# Patient Record
Sex: Female | Born: 1940 | Race: White | Hispanic: No | Marital: Married | Smoking: Never smoker
Health system: Southern US, Community
[De-identification: ages and names within clinical notes are randomized; demographics above are authoritative.]

## PROBLEM LIST (undated history)

## (undated) DIAGNOSIS — E785 Hyperlipidemia, unspecified: Secondary | ICD-10-CM

## (undated) DIAGNOSIS — I10 Essential (primary) hypertension: Secondary | ICD-10-CM

## (undated) DIAGNOSIS — C50919 Malignant neoplasm of unspecified site of unspecified female breast: Secondary | ICD-10-CM

## (undated) DIAGNOSIS — E119 Type 2 diabetes mellitus without complications: Secondary | ICD-10-CM

## (undated) DIAGNOSIS — L0390 Cellulitis, unspecified: Secondary | ICD-10-CM

## (undated) DIAGNOSIS — I219 Acute myocardial infarction, unspecified: Secondary | ICD-10-CM

## (undated) DIAGNOSIS — I2510 Atherosclerotic heart disease of native coronary artery without angina pectoris: Secondary | ICD-10-CM

## (undated) HISTORY — PX: ABDOMINAL HYSTERECTOMY: SHX81

## (undated) HISTORY — PX: MASTECTOMY: SHX3

## (undated) HISTORY — PX: REPLACEMENT TOTAL KNEE BILATERAL: SUR1225

## (undated) HISTORY — PX: BREAST LUMPECTOMY: SHX2

## (undated) HISTORY — PX: CHOLECYSTECTOMY: SHX55

## (undated) HISTORY — PX: CATARACT EXTRACTION: SUR2

## (undated) HISTORY — PX: TOTAL ABDOMINAL HYSTERECTOMY W/ BILATERAL SALPINGOOPHORECTOMY: SHX83

## (undated) HISTORY — PX: CERVICAL FUSION: SHX112

---

## 2005-09-13 ENCOUNTER — Ambulatory Visit: Payer: Self-pay | Admitting: Internal Medicine

## 2007-07-18 ENCOUNTER — Inpatient Hospital Stay: Payer: Self-pay | Admitting: Internal Medicine

## 2011-01-15 ENCOUNTER — Ambulatory Visit: Payer: Self-pay | Admitting: Family Medicine

## 2011-09-24 ENCOUNTER — Ambulatory Visit: Payer: Self-pay | Admitting: Ophthalmology

## 2011-11-11 ENCOUNTER — Ambulatory Visit: Payer: Self-pay | Admitting: Ophthalmology

## 2012-10-04 ENCOUNTER — Ambulatory Visit: Payer: Self-pay | Admitting: Internal Medicine

## 2015-07-26 ENCOUNTER — Ambulatory Visit
Admission: EM | Admit: 2015-07-26 | Discharge: 2015-07-26 | Disposition: A | Discharge status: Home / Self Care | Payer: Medicare Other | Attending: Family Medicine | Admitting: Family Medicine

## 2015-07-26 ENCOUNTER — Encounter: Payer: Self-pay | Admitting: Emergency Medicine

## 2015-07-26 DIAGNOSIS — L03116 Cellulitis of left lower limb: Secondary | ICD-10-CM | POA: Diagnosis not present

## 2015-07-26 HISTORY — DX: Type 2 diabetes mellitus without complications: E11.9

## 2015-07-26 HISTORY — DX: Atherosclerotic heart disease of native coronary artery without angina pectoris: I25.10

## 2015-07-26 HISTORY — DX: Cellulitis, unspecified: L03.90

## 2015-07-26 HISTORY — DX: Malignant neoplasm of unspecified site of unspecified female breast: C50.919

## 2015-07-26 HISTORY — DX: Acute myocardial infarction, unspecified: I21.9

## 2015-07-26 HISTORY — DX: Hyperlipidemia, unspecified: E78.5

## 2015-07-26 HISTORY — DX: Essential (primary) hypertension: I10

## 2015-07-26 MED ORDER — AMOXICILLIN-POT CLAVULANATE 875-125 MG PO TABS: 1.0000 | ORAL_TABLET | Freq: Two times a day (BID) | ORAL | Status: DC

## 2015-07-26 MED ORDER — DOXYCYCLINE HYCLATE 100 MG PO TABS: 100.0000 mg | ORAL_TABLET | Freq: Two times a day (BID) | ORAL | Status: DC

## 2015-07-26 NOTE — ED Notes (Signed)
Patient c/o redness and tenderness in her left lower leg that for several years.  Patient states that her sores do not heal.  Patient denies fevers. Patient reports drainage from the sores.

## 2015-07-26 NOTE — Discharge Instructions (Signed)

## 2015-07-26 NOTE — ED Provider Notes (Signed)
CSN: [id number]     Arrival date & time 07/26/15  1154 History   First MD Initiated Contact with Patient 07/26/15 1254     Chief Complaint  Patient presents with  . Leg Pain   (Consider location/radiation/quality/duration/timing/severity/associated sxs/prior Treatment) HPI Comments: 75 yo female with approx 2 days h/o left lower leg skin redness, warmth and tenderness. States has had intermittent recurrences of similar symptoms for several year. Seems to resolve with prescribed clindamycin but symptoms recur. Denies any fevers, chills or drainage.  The history is provided by the patient.    Past Medical History  Diagnosis Date  . Diabetes mellitus without complication (HCC)   . Breast cancer (HCC)   . Hypertension   . Coronary artery disease   . Myocardial infarction (HCC)   . Cellulitis   . Hyperlipidemia    Past Surgical History  Procedure Laterality Date  . Mastectomy Right   . Abdominal hysterectomy    . Replacement total knee bilateral    . Total abdominal hysterectomy w/ bilateral salpingoophorectomy    . Cholecystectomy    . Cataract extraction    . Breast lumpectomy    . Cervical fusion     History reviewed. No pertinent family history. Social History  Substance Use Topics  . Smoking status: Never Smoker   . Smokeless tobacco: Never Used  . Alcohol Use: No   OB History    No data available     Review of Systems  Allergies  Codeine; Morphine and related; and Sulfa antibiotics  Home Medications   Prior to Admission medications   Medication Sig Start Date End Date Taking? Authorizing Provider  amLODipine (NORVASC) 10 MG tablet Take 10 mg by mouth daily.   Yes Historical Provider, MD  aspirin 81 MG tablet Take 81 mg by mouth daily.   Yes Historical Provider, MD  B Complex-C (B-COMPLEX WITH VITAMIN C) tablet Take 1 tablet by mouth daily.   Yes Historical Provider, MD  citalopram (CELEXA) 10 MG tablet Take 10 mg by mouth daily.   Yes Historical Provider, MD    citalopram (CELEXA) 40 MG tablet Take 40 mg by mouth daily.   Yes Historical Provider, MD  clopidogrel (PLAVIX) 75 MG tablet Take 75 mg by mouth daily.   Yes Historical Provider, MD  ergocalciferol (VITAMIN D2) 50000 units capsule Take 50,000 Units by mouth once a week.   Yes Historical Provider, MD  ezetimibe (ZETIA) 10 MG tablet Take 10 mg by mouth daily.   Yes Historical Provider, MD  furosemide (LASIX) 40 MG tablet Take 40 mg by mouth daily.   Yes Historical Provider, MD  hydrochlorothiazide (HYDRODIURIL) 25 MG tablet Take 25 mg by mouth daily.   Yes Historical Provider, MD  insulin NPH Human (HUMULIN N,NOVOLIN N) 100 UNIT/ML injection Inject 24 Units into the skin at bedtime.   Yes Historical Provider, MD  insulin regular (NOVOLIN R,HUMULIN R) 100 units/mL injection Inject 24 Units into the skin 3 (three) times daily before meals.   Yes Historical Provider, MD  isosorbide mononitrate (IMDUR) 120 MG 24 hr tablet Take 120 mg by mouth daily.   Yes Historical Provider, MD  metoprolol succinate (TOPROL-XL) 100 MG 24 hr tablet Take 100 mg by mouth daily. Take with or immediately following a meal.   Yes Historical Provider, MD  nitroGLYCERIN (NITROSTAT) 0.4 MG SL tablet Place 0.4 mg under the tongue every 5 (five) minutes as needed for chest pain.   Yes Historical Provider, MD  omega-3 acid  ethyl esters (LOVAZA) 1 g capsule Take 1 g by mouth daily.   Yes Historical Provider, MD  rosuvastatin (CRESTOR) 40 MG tablet Take 40 mg by mouth daily.   Yes Historical Provider, MD  amoxicillin-clavulanate (AUGMENTIN) 875-125 MG tablet Take 1 tablet by mouth 2 (two) times daily. 07/26/15   Norval Gable, MD  doxycycline (VIBRA-TABS) 100 MG tablet Take 1 tablet (100 mg total) by mouth 2 (two) times daily. 07/26/15   Norval Gable, MD   Meds Ordered and Administered this Visit  Medications - No data to display  BP 146/65 mmHg  Pulse 98  Temp(Src) 97.5 F (36.4 C) (Oral)  Resp 17  Ht 5\' 7"  (1.702 m)  Wt 260  lb (117.935 kg)  BMI 40.71 kg/m2  SpO2 98% No data found.   Physical Exam  Constitutional: She appears well-developed and well-nourished. No distress.  Musculoskeletal:       Left lower leg: She exhibits tenderness and edema (mild). She exhibits no bony tenderness, no deformity and no laceration.       Legs: Left lower extremity skin (mainly medial and anterior) with warmth, blanchable erythema and tenderness to palpation; no calf tenderness; no drainge  Skin: She is not diaphoretic. There is erythema.  Nursing note and vitals reviewed.   ED Course  Procedures (including critical care time)  Labs Review Labs Reviewed - No data to display  Imaging Review No results found.   Visual Acuity Review  Right Eye Distance:   Left Eye Distance:   Bilateral Distance:    Right Eye Near:   Left Eye Near:    Bilateral Near:         MDM   1. Cellulitis of left lower extremity    Discharge Medication List as of 07/26/2015  1:07 PM    START taking these medications   Details  amoxicillin-clavulanate (AUGMENTIN) 875-125 MG tablet Take 1 tablet by mouth 2 (two) times daily., Starting 07/26/2015, Until Discontinued, Normal    doxycycline (VIBRA-TABS) 100 MG tablet Take 1 tablet (100 mg total) by mouth 2 (two) times daily., Starting 07/26/2015, Until Discontinued, Normal       1. diagnosis reviewed with patient 2. rx as per orders above; reviewed possible side effects, interactions, risks and benefits  3. Recommend supportive treatment with elevation, warm compresses 4. Follow-up prn if symptoms worsen or don't improve    Norval Gable, MD 07/26/15 2117

## 2015-08-29 ENCOUNTER — Other Ambulatory Visit: Payer: Self-pay | Admitting: Medical Oncology

## 2015-08-29 ENCOUNTER — Ambulatory Visit
Admission: RE | Admit: 2015-08-29 | Discharge: 2015-08-29 | Disposition: A | Discharge status: Home / Self Care | Payer: Medicare Other | Source: Ambulatory Visit | Attending: Medical Oncology | Admitting: Medical Oncology

## 2015-08-29 DIAGNOSIS — M79605 Pain in left leg: Secondary | ICD-10-CM

## 2018-03-04 ENCOUNTER — Other Ambulatory Visit (INDEPENDENT_AMBULATORY_CARE_PROVIDER_SITE_OTHER): Payer: Self-pay | Admitting: Vascular Surgery

## 2018-03-04 DIAGNOSIS — I872 Venous insufficiency (chronic) (peripheral): Secondary | ICD-10-CM

## 2018-03-06 ENCOUNTER — Ambulatory Visit (INDEPENDENT_AMBULATORY_CARE_PROVIDER_SITE_OTHER): Payer: Medicare HMO | Admitting: Vascular Surgery

## 2018-03-06 ENCOUNTER — Encounter (INDEPENDENT_AMBULATORY_CARE_PROVIDER_SITE_OTHER): Payer: Self-pay | Admitting: Vascular Surgery

## 2018-03-06 ENCOUNTER — Telehealth (INDEPENDENT_AMBULATORY_CARE_PROVIDER_SITE_OTHER): Payer: Self-pay

## 2018-03-06 ENCOUNTER — Ambulatory Visit (INDEPENDENT_AMBULATORY_CARE_PROVIDER_SITE_OTHER): Payer: Medicare HMO

## 2018-03-06 VITALS — BP 97/67 | HR 93 | Resp 18

## 2018-03-06 DIAGNOSIS — L97211 Non-pressure chronic ulcer of right calf limited to breakdown of skin: Secondary | ICD-10-CM

## 2018-03-06 DIAGNOSIS — M79604 Pain in right leg: Secondary | ICD-10-CM

## 2018-03-06 DIAGNOSIS — M79609 Pain in unspecified limb: Secondary | ICD-10-CM | POA: Insufficient documentation

## 2018-03-06 DIAGNOSIS — E119 Type 2 diabetes mellitus without complications: Secondary | ICD-10-CM | POA: Diagnosis not present

## 2018-03-06 DIAGNOSIS — L97221 Non-pressure chronic ulcer of left calf limited to breakdown of skin: Secondary | ICD-10-CM | POA: Diagnosis not present

## 2018-03-06 DIAGNOSIS — R600 Localized edema: Secondary | ICD-10-CM

## 2018-03-06 DIAGNOSIS — M7989 Other specified soft tissue disorders: Secondary | ICD-10-CM | POA: Insufficient documentation

## 2018-03-06 DIAGNOSIS — M79605 Pain in left leg: Secondary | ICD-10-CM

## 2018-03-06 DIAGNOSIS — L97209 Non-pressure chronic ulcer of unspecified calf with unspecified severity: Secondary | ICD-10-CM | POA: Insufficient documentation

## 2018-03-06 DIAGNOSIS — L97201 Non-pressure chronic ulcer of unspecified calf limited to breakdown of skin: Secondary | ICD-10-CM

## 2018-03-06 DIAGNOSIS — I872 Venous insufficiency (chronic) (peripheral): Secondary | ICD-10-CM

## 2018-03-06 NOTE — Telephone Encounter (Signed)
Patients daughter called and left a message on the triage line and stated that she needed to know how long to keep the unna wrap on. Before I was able to call her back she called and was transferred to the desk I am by Levada Dy. Her daughter wanted to let us know that her mother already got an appointment for Monday at the wound center. She is worried about the length of time being to short in the [place] boot. Advised her that the wound center will have to take the wraps off in order to visualize the wounds and they will then do what they see fit, whether it be re-wrap her or another course of action. Daughter verbalized understanding

## 2018-03-06 NOTE — Assessment & Plan Note (Signed)
Three layer UNNA boots were placed today.  Will refer to wound care center at their request.

## 2018-03-06 NOTE — Assessment & Plan Note (Signed)
ABIs were normal bilaterally with no evidence of arterial insufficiency.  Arterial perfusion is adequate for wound healing and stable for any upcoming surgery.

## 2018-03-06 NOTE — Patient Instructions (Signed)

## 2018-03-06 NOTE — Assessment & Plan Note (Signed)
The patient has pronounced swelling bilaterally.  We are going to get her in a 3 layer Unna boot today and this will need to be changed weekly.  This should help the swelling and the ulceration.  Ultimately, she will need compression garments and hopefully increased activity with elevation as well.  We will get a venous reflux study in the near future at her convenience as well.

## 2018-03-06 NOTE — Assessment & Plan Note (Signed)
blood glucose control important in reducing the progression of atherosclerotic disease. Also, involved in wound healing. On appropriate medications.  

## 2018-03-06 NOTE — Progress Notes (Signed)
Patient ID: Theresa [name], female   DOB: 1940/07/06, 78 y.o.   MRN: [id number]  Chief Complaint  Patient presents with  . New Patient (Initial Visit)    HPI Theresa [name] is a 78 y.o. female.  I am asked to see the patient by Dr. Kym [name] for evaluation of vascular status with non-healing LE wounds and upcoming hip surgery.  The patient reports episodes of ulceration of the lower legs as well as swelling over many years.  She currently has several small ulcerations on both calves and lower legs with serous fluid draining.  She is in need of a major hip surgery and her vascular status is a concern going into the surgery.  To evaluate her arterial status ABIs were done today and were found to be 1.2 and triphasic bilaterally demonstrating no arterial insufficiency.  Her legs are very painful.  No fevers or chills.  There is no clear inciting event or causative factor that started the pain, swelling, or ulceration.  Both legs are affected.   Past Medical History:  Diagnosis Date  . Breast cancer ([place])   . Cellulitis   . Coronary artery disease   . Diabetes mellitus without complication ([place])   . Hyperlipidemia   . Hypertension   . Myocardial infarction [facility])     Past Surgical History:  Procedure Laterality Date  . ABDOMINAL HYSTERECTOMY    . BREAST LUMPECTOMY    . CATARACT EXTRACTION    . CERVICAL FUSION    . CHOLECYSTECTOMY    . MASTECTOMY Right   . REPLACEMENT TOTAL KNEE BILATERAL    . TOTAL ABDOMINAL HYSTERECTOMY W/ BILATERAL SALPINGOOPHORECTOMY      Family History No bleeding disorders, clotting disorders, autoimmune diseases, or porphyria   Social History   Tobacco Use  . Smoking status: Never Smoker  . Smokeless tobacco: Never Used  Substance Use Topics  . Alcohol use: No  . Drug use: No    Allergies  Allergen Reactions  . Codeine Other (See Comments)    Headache  . Morphine And Related Other (See Comments)    Headache  . Sulfa Antibiotics Other (See  Comments)    Kidney Failure    Current Outpatient Medications  Medication Sig Dispense Refill  . amLODipine (NORVASC) 10 MG tablet Take 10 mg by mouth daily.    Marland Kitchen aspirin 81 MG tablet Take 81 mg by mouth daily.    . clopidogrel (PLAVIX) 75 MG tablet Take 75 mg by mouth daily.    Marland Kitchen ezetimibe (ZETIA) 10 MG tablet Take 10 mg by mouth daily.    . furosemide (LASIX) 40 MG tablet Take 40 mg by mouth daily.    . insulin NPH Human (HUMULIN N,NOVOLIN N) 100 UNIT/ML injection Inject 24 Units into the skin at bedtime.    . insulin regular (NOVOLIN R,HUMULIN R) 100 units/mL injection Inject 24 Units into the skin 3 (three) times daily before meals.    . isosorbide mononitrate (IMDUR) 120 MG 24 hr tablet Take 120 mg by mouth daily.    . metoprolol succinate (TOPROL-XL) 100 MG 24 hr tablet Take 100 mg by mouth daily. Take with or immediately following a meal.    . nitroGLYCERIN (NITROSTAT) 0.4 MG SL tablet Place 0.4 mg under the tongue every 5 (five) minutes as needed for chest pain.    . rosuvastatin (CRESTOR) 40 MG tablet Take 40 mg by mouth daily.    . traMADol (ULTRAM) 50 MG tablet Take 1 tablet  by mouth every 6 (six) hours as needed.     No current facility-administered medications for this visit.       REVIEW OF SYSTEMS (Negative unless checked)  Constitutional: [] Weight loss  [] Fever  [] Chills Cardiac: [] Chest pain   [] Chest pressure   [] Palpitations   [] Shortness of breath when laying flat   [] Shortness of breath at rest   [] Shortness of breath with exertion. Vascular:  [] Pain in legs with walking   [x] Pain in legs at rest   [x] Pain in legs when laying flat   [] Claudication   [] Pain in feet when walking  [] Pain in feet at rest  [] Pain in feet when laying flat   [] History of DVT   [] Phlebitis   [x] Swelling in legs   [] Varicose veins   [x] Non-healing ulcers Pulmonary:   [] Uses home oxygen   [] Productive cough   [] Hemoptysis   [] Wheeze  [] COPD   [] Asthma Neurologic:  [] Dizziness  [] Blackouts    [] Seizures   [] History of stroke   [] History of TIA  [] Aphasia   [] Temporary blindness   [] Dysphagia   [] Weakness or numbness in arms   [] Weakness or numbness in legs Musculoskeletal:  [] Arthritis   [] Joint swelling   [] Joint pain   [] Low back pain Hematologic:  [] Easy bruising  [] Easy bleeding   [] Hypercoagulable state   [] Anemic  [] Hepatitis Gastrointestinal:  [] Blood in stool   [] Vomiting blood  [] Gastroesophageal reflux/heartburn   [] Abdominal pain Genitourinary:  [] Chronic kidney disease   [] Difficult urination  [] Frequent urination  [] Burning with urination   [] Hematuria Skin:  [] Rashes   [x] Ulcers   [x] Wounds Psychological:  [] History of anxiety   []  History of major depression.    Physical Exam BP 97/67 (BP Location: Right Arm, Patient Position: Sitting)   Pulse 93   Resp 18  Gen:  WD/WN, NAD Head: Theresa [name], No temporalis wasting.  Ear/Nose/Throat: Hearing grossly intact, nares w/o erythema or drainage, oropharynx w/o Erythema/Exudate Eyes: Conjunctiva clear, sclera non-icteric  Neck: trachea midline.    Pulmonary:  Good air movement, respirations not labored, no use of accessory muscles Cardiac: RRR, no JVD Vascular:  Vessel Right Left  Radial Palpable Palpable                          PT  trace palpable  trace palpable  DP  1+ palpable  1+ palpable   Gastrointestinal: soft, non-tender/non-distended.  Musculoskeletal: Wheelchair.  Extremities without ischemic changes.  No deformity or atrophy.  Shallow ulcerations bilaterally with weeping serous fluid in the calves and lower legs.  2+ bilateral lower extremity edema. Neurologic: Sensation grossly intact in extremities.  Symmetrical.  Speech is fluent. Motor exam as listed above. Psychiatric: Judgment intact, Mood & affect appropriate for pt's clinical situation. Dermatologic: Lower leg and calf wounds as described above   Radiology No results found.  Labs No results found for this or any previous visit (from the past  2160 hour(s)).  Assessment/Plan:  Pain in limb ABIs were normal bilaterally with no evidence of arterial insufficiency.  Arterial perfusion is adequate for wound healing and stable for any upcoming surgery.  Diabetes ([place]) blood glucose control important in reducing the progression of atherosclerotic disease. Also, involved in wound healing. On appropriate medications.   Lower limb ulcer, calf (HCC) Three layer UNNA boots were placed today.  Will refer to wound care center at their request.    Swelling of limb The patient has pronounced swelling bilaterally.  We are  going to get her in a 3 layer Unna boot today and this will need to be changed weekly.  This should help the swelling and the ulceration.  Ultimately, she will need compression garments and hopefully increased activity with elevation as well.  We will get a venous reflux study in the near future at her convenience as well.      Leotis Pain 03/06/2018, 9:30 AM   This note was created with Dragon medical transcription system.  Any errors from dictation are unintentional.

## 2018-03-09 ENCOUNTER — Encounter: Payer: Medicare HMO | Attending: Physician Assistant | Admitting: Physician Assistant

## 2018-03-09 DIAGNOSIS — I252 Old myocardial infarction: Secondary | ICD-10-CM | POA: Insufficient documentation

## 2018-03-09 DIAGNOSIS — L97812 Non-pressure chronic ulcer of other part of right lower leg with fat layer exposed: Secondary | ICD-10-CM | POA: Insufficient documentation

## 2018-03-09 DIAGNOSIS — I890 Lymphedema, not elsewhere classified: Secondary | ICD-10-CM | POA: Diagnosis not present

## 2018-03-09 DIAGNOSIS — E1136 Type 2 diabetes mellitus with diabetic cataract: Secondary | ICD-10-CM | POA: Diagnosis not present

## 2018-03-09 DIAGNOSIS — E11622 Type 2 diabetes mellitus with other skin ulcer: Secondary | ICD-10-CM | POA: Diagnosis not present

## 2018-03-09 DIAGNOSIS — N183 Chronic kidney disease, stage 3 (moderate): Secondary | ICD-10-CM | POA: Diagnosis not present

## 2018-03-09 DIAGNOSIS — E1151 Type 2 diabetes mellitus with diabetic peripheral angiopathy without gangrene: Secondary | ICD-10-CM | POA: Insufficient documentation

## 2018-03-09 DIAGNOSIS — I509 Heart failure, unspecified: Secondary | ICD-10-CM | POA: Diagnosis not present

## 2018-03-09 DIAGNOSIS — I872 Venous insufficiency (chronic) (peripheral): Secondary | ICD-10-CM | POA: Diagnosis not present

## 2018-03-09 DIAGNOSIS — E1122 Type 2 diabetes mellitus with diabetic chronic kidney disease: Secondary | ICD-10-CM | POA: Diagnosis not present

## 2018-03-09 DIAGNOSIS — Z853 Personal history of malignant neoplasm of breast: Secondary | ICD-10-CM | POA: Insufficient documentation

## 2018-03-09 DIAGNOSIS — I2510 Atherosclerotic heart disease of native coronary artery without angina pectoris: Secondary | ICD-10-CM | POA: Insufficient documentation

## 2018-03-09 DIAGNOSIS — I130 Hypertensive heart and chronic kidney disease with heart failure and stage 1 through stage 4 chronic kidney disease, or unspecified chronic kidney disease: Secondary | ICD-10-CM | POA: Insufficient documentation

## 2018-03-09 DIAGNOSIS — L97322 Non-pressure chronic ulcer of left ankle with fat layer exposed: Secondary | ICD-10-CM | POA: Diagnosis not present

## 2018-03-09 DIAGNOSIS — M1990 Unspecified osteoarthritis, unspecified site: Secondary | ICD-10-CM | POA: Insufficient documentation

## 2018-03-09 DIAGNOSIS — E1140 Type 2 diabetes mellitus with diabetic neuropathy, unspecified: Secondary | ICD-10-CM | POA: Diagnosis not present

## 2018-03-09 DIAGNOSIS — E11621 Type 2 diabetes mellitus with foot ulcer: Secondary | ICD-10-CM | POA: Insufficient documentation

## 2018-03-09 DIAGNOSIS — Z885 Allergy status to narcotic agent status: Secondary | ICD-10-CM | POA: Insufficient documentation

## 2018-03-09 DIAGNOSIS — L97909 Non-pressure chronic ulcer of unspecified part of unspecified lower leg with unspecified severity: Secondary | ICD-10-CM | POA: Diagnosis present

## 2018-03-09 DIAGNOSIS — Z8541 Personal history of malignant neoplasm of cervix uteri: Secondary | ICD-10-CM | POA: Diagnosis not present

## 2018-03-10 NOTE — Progress Notes (Signed)
Theresa [name] ([id number]) Visit Report for 03/09/2018 Abuse/Suicide Risk Screen Details Patient Name: Theresa [name], Theresa [name]. Date of Service: 03/09/2018 8:45 AM Medical Record Number: [id number] Patient Account Number: [id number] Date of Birth/Sex: 02-Oct-1940 (78 y.o. F) Treating RN: Montey Hora Primary Care Nadiya Pieratt: Johny Drilling Other Clinician: Referring Willman Cuny: Leotis Pain Treating Christi Wirick/Extender: Melburn Hake, HOYT Weeks in Treatment: 0 Abuse/Suicide Risk Screen Items Answer ABUSE/SUICIDE RISK SCREEN: Has anyone close to you tried to hurt or harm you recentlyo No Do you feel uncomfortable with anyone in your familyo No Has anyone forced you do things that you didnot want to doo No Do you have any thoughts of harming yourselfo No Patient displays signs or symptoms of abuse and/or neglect. No Electronic Signature(s) Signed: 03/09/2018 5:02:11 PM By: Montey Hora Entered By: Montey Hora on 03/09/2018 09:25:54 Villela, Keiona Mamie Nick ([id number]) -------------------------------------------------------------------------------- Activities of Daily Living Details Patient Name: Strange, [name] Date of Service: 03/09/2018 8:45 AM Medical Record Number: [id number] Patient Account Number: [id number] Date of Birth/Sex: 06/24/1940 (78 y.o. F) Treating RN: Montey Hora Primary Care Kristal Perl: Johny Drilling Other Clinician: Referring Martinez Boxx: Leotis Pain Treating Aundra Pung/Extender: Melburn Hake, HOYT Weeks in Treatment: 0 Activities of Daily Living Items Answer Activities of Daily Living (Please select one for each item) Drive Automobile Not Able Take Medications Completely Able Use Telephone Completely Able Care for Appearance Need Assistance Use Toilet Need Assistance Bath / Shower Need Assistance Dress Self Need Assistance Feed Self Completely Able Walk Need Assistance Get In / Out Bed Need Assistance Housework Completely Able Prepare Meals Completely [place] for Self Need Assistance Electronic Signature(s) Signed: 03/09/2018 5:02:11 PM By: Montey Hora Entered By: Montey Hora on 03/09/2018 09:26:31 Conrow, Addalie Mamie Nick ([id number]) -------------------------------------------------------------------------------- Education Assessment Details Patient Name: Mauger, [name] Date of Service: 03/09/2018 8:45 AM Medical Record Number: [id number] Patient Account Number: [id number] Date of Birth/Sex: 1940/07/19 (78 y.o. F) Treating RN: Montey Hora Primary Care Schyler Butikofer: Johny Drilling Other Clinician: Referring Sheelah Ritacco: Leotis Pain Treating Latoy Labriola/Extender: Melburn Hake, HOYT Weeks in Treatment: 0 Primary Learner Assessed: Patient Learning Preferences/Education Level/Primary Language Learning Preference: Explanation, Demonstration Highest Education Level: College or Above Preferred Language: English Cognitive Barrier Assessment/Beliefs Language Barrier: No Translator Needed: No Memory Deficit: No Emotional Barrier: No Cultural/Religious Beliefs Affecting Medical Care: No Physical Barrier Assessment Impaired Vision: No Impaired Hearing: No Decreased Hand dexterity: No Knowledge/Comprehension Assessment Knowledge Level: Medium Comprehension Level: Medium Ability to understand written Medium instructions: Ability to understand verbal Medium instructions: Motivation Assessment Anxiety Level: Calm Cooperation: Cooperative Education Importance: Acknowledges Need Interest in Health Problems: Asks Questions Perception: Coherent Willingness to Engage in Self- Medium Management Activities: Readiness to Engage in Self- Medium Management Activities: Electronic Signature(s) Signed: 03/09/2018 5:02:11 PM By: Montey Hora Entered By: Montey Hora on 03/09/2018 09:27:10 Zappone, Maurica Mamie Nick ([id number]) -------------------------------------------------------------------------------- Fall Risk Assessment  Details Patient Name: Findling, [name] Date of Service: 03/09/2018 8:45 AM Medical Record Number: [id number] Patient Account Number: [id number] Date of Birth/Sex: 07/22/1940 (78 y.o. F) Treating RN: Montey Hora Primary Care Myrtis Maille: Johny Drilling Other Clinician: Referring Ceirra Belli: Leotis Pain Treating Jina Olenick/Extender: Melburn Hake, HOYT Weeks in Treatment: 0 Fall Risk Assessment Items Have you had 2 or more falls in the last 12 monthso 0 No Have you had any fall that resulted in injury in the last 12 monthso 0 No FALL RISK ASSESSMENT: History of falling - immediate or within 3 months 0 No Secondary diagnosis 0 No Ambulatory aid None/bed rest/wheelchair/nurse 0 No Crutches/cane/walker 15 Yes Furniture  0 No IV Access/Saline Lock 0 No Gait/Training Normal/bed rest/immobile 0 No Weak 10 Yes Impaired 20 Yes Mental Status Oriented to own ability 0 Yes Electronic Signature(s) Signed: 03/09/2018 5:02:11 PM By: Montey Hora Entered By: Montey Hora on 03/09/2018 09:27:42 Purdom, Raelynne P. ([id number]) -------------------------------------------------------------------------------- Foot Assessment Details Patient Name: Heuer, [name] Date of Service: 03/09/2018 8:45 AM Medical Record Number: [id number] Patient Account Number: [id number] Date of Birth/Sex: 1940-12-09 (78 y.o. F) Treating RN: Montey Hora Primary Care De Libman: Johny Drilling Other Clinician: Referring Samaiya Awadallah: Leotis Pain Treating Emma-Lee Oddo/Extender: Melburn Hake, HOYT Weeks in Treatment: 0 Foot Assessment Items Site Locations + = Sensation present, - = Sensation absent, C = Callus, U = Ulcer R = Redness, W = Warmth, M = Maceration, PU = Pre-ulcerative lesion F = Fissure, S = Swelling, D = Dryness Assessment Right: Left: Other Deformity: No No Prior Foot Ulcer: No No Prior Amputation: No No Charcot Joint: No No Ambulatory Status: Non-ambulatory Assistance Device: Wheelchair Gait: Actor) Signed: 03/09/2018 5:02:11 PM By: Montey Hora Entered By: Montey Hora on 03/09/2018 09:29:01 Gaffin, Sulema P. ([id number]) -------------------------------------------------------------------------------- Nutrition Risk Assessment Details Patient Name: Helf, Tessie P. Date of Service: 03/09/2018 8:45 AM Medical Record Number: [id number] Patient Account Number: [id number] Date of Birth/Sex: 03-Dec-1940 (78 y.o. F) Treating RN: Montey Hora Primary Care Pattijo Juste: Johny Drilling Other Clinician: Referring Donika Butner: Leotis Pain Treating Husna Krone/Extender: Melburn Hake, HOYT Weeks in Treatment: 0 Height (in): 67 Weight (lbs): 216 Body Mass Index (BMI): 33.8 Nutrition Risk Assessment Items NUTRITION RISK SCREEN: I have an illness or condition that made me change the kind and/or amount of 0 No food I eat I eat fewer than two meals per day 0 No I eat few fruits and vegetables, or milk products 0 No I have three or more drinks of beer, liquor or wine almost every day 0 No I have tooth or mouth problems that make it hard for me to eat 0 No I don't always have enough money to buy the food I need 0 No I eat alone most of the time 0 No I take three or more different prescribed or over-the-counter drugs a day 1 Yes Without wanting to, I have lost or gained 10 pounds in the last six months 0 No I am not always physically able to shop, cook and/or feed myself 0 No Nutrition Protocols Good Risk Protocol 0 No interventions needed Moderate Risk Protocol Electronic Signature(s) Signed: 03/09/2018 5:02:11 PM By: Montey Hora Entered By: Montey Hora on 03/09/2018 09:27:53

## 2018-03-11 DIAGNOSIS — E11622 Type 2 diabetes mellitus with other skin ulcer: Secondary | ICD-10-CM | POA: Diagnosis not present

## 2018-03-11 NOTE — Progress Notes (Signed)
KIRAT, MEZQUITA ([id number]) Visit Report for 03/09/2018 Allergy List Details Patient Name: Theresa [name], Theresa [name]. Date of Service: 03/09/2018 8:45 AM Medical Record Number: [id number] Patient Account Number: [id number] Date of Birth/Sex: [birth year] (78 y.o. F) Treating RN: Montey Hora Primary Care Toshua Honsinger: Johny Drilling Other Clinician: Referring Quinnley Colasurdo: Johny Drilling Treating Maham Quintin/Extender: Melburn Hake, HOYT Weeks in Treatment: 0 Allergies Active Allergies codeine morphine HCl Allergy Notes Electronic Signature(s) Signed: 03/09/2018 5:02:11 PM By: Montey Hora Entered By: Montey Hora on 03/09/2018 09:14:12 Kosak, Loetta P. ([id number]) -------------------------------------------------------------------------------- Arrival Information Details Patient Name: Majewski, Ryonna P. Date of Service: 03/09/2018 8:45 AM Medical Record Number: [id number] Patient Account Number: [id number] Date of Birth/Sex: [birth year] (78 y.o. F) Treating RN: Cornell Barman Primary Care Lailoni Baquera: Johny Drilling Other Clinician: Referring Leva Baine: Johny Drilling Treating Twania Bujak/Extender: Melburn Hake, HOYT Weeks in Treatment: 0 Visit Information Patient Arrived: Wheel Chair Arrival Time: 09:01 Accompanied By: daughter Transfer Assistance: Manual Patient Identification Verified: Yes Secondary Verification Process Yes Completed: Patient Has Alerts: Yes Patient Alerts: Patient on Blood Thinner Plavix, 81mg  aspirin Type II Diabetic 03/06/2018 ABI AVVS (L) 1.28 (R) 1.22 Electronic Signature(s) Signed: 03/09/2018 11:46:37 AM By: Gretta Cool, BSN, RN, CWS, Kim RN, BSN Entered By: Gretta Cool, BSN, RN, CWS, Kim on 03/09/2018 11:46:37 Bhattacharyya, Fredna Dow ([id number]) -------------------------------------------------------------------------------- Clinic Level of Care Assessment Details Patient Name: Lada, Tamela P. Date of Service: 03/09/2018 8:45 AM Medical Record Number: [id number] Patient Account Number:  [id number] Date of Birth/Sex: [birth year] (78 y.o. F) Treating RN: Cornell Barman Primary Care Lemar Bakos: Johny Drilling Other Clinician: Referring Sentoria Brent: Johny Drilling Treating Caymen Dubray/Extender: Melburn Hake, HOYT Weeks in Treatment: 0 Clinic Level of Care Assessment Items TOOL 1 Quantity Score []  - Use when EandM and Procedure is performed on INITIAL visit 0 ASSESSMENTS - Nursing Assessment / Reassessment X - General Physical Exam (combine w/ comprehensive assessment (listed just below) when 1 20 performed on new pt. evals) X- 1 25 Comprehensive Assessment (HX, ROS, Risk Assessments, Wounds Hx, etc.) ASSESSMENTS - Wound and Skin Assessment / Reassessment X - Dermatologic / Skin Assessment (not related to wound area) 1 10 ASSESSMENTS - Ostomy and/or Continence Assessment and Care []  - Incontinence Assessment and Management 0 []  - 0 Ostomy Care Assessment and Management (repouching, etc.) PROCESS - Coordination of Care X - Simple Patient / Family Education for ongoing care 1 15 []  - 0 Complex (extensive) Patient / Family Education for ongoing care []  - 0 Staff obtains Programmer, systems, Records, Test Results / Process Orders []  - 0 Staff telephones HHA, Nursing Homes / Clarify orders / etc []  - 0 Routine Transfer to another Facility (non-emergent condition) []  - 0 Routine Hospital Admission (non-emergent condition) X- 1 15 New Admissions / Biomedical engineer / Ordering NPWT, Apligraf, etc. []  - 0 Emergency Hospital Admission (emergent condition) PROCESS - Special Needs []  - Pediatric / Minor Patient Management 0 []  - 0 Isolation Patient Management []  - 0 Hearing / Language / Visual special needs []  - 0 Assessment of Community assistance (transportation, D/C planning, etc.) []  - 0 Additional assistance / Altered mentation []  - 0 Support Surface(s) Assessment (bed, cushion, seat, etc.) Ovando, Neosha P. ([id number]) INTERVENTIONS - Miscellaneous []  - External ear exam 0 []  -  0 Patient Transfer (multiple staff / Civil Service fast streamer / Similar devices) []  - 0 Simple Staple / Suture removal (25 or less) []  - 0 Complex Staple / Suture removal (26 or more) []  - 0 Hypo/Hyperglycemic Management (do not check if billed separately) X- 1 15 Ankle / Brachial Index (  ABI) - do not check if billed separately Has the patient been seen at the hospital within the last three years: Yes Total Score: 100 Level Of Care: New/Established - Level 3 Electronic Signature(s) Signed: 03/10/2018 11:53:49 AM By: Gretta Cool, BSN, RN, CWS, Kim RN, BSN Entered By: Gretta Cool, BSN, RN, CWS, Kim on 03/09/2018 10:12:17 Deliz, Shavonta P. ([id number]) -------------------------------------------------------------------------------- Compression Therapy Details Patient Name: Mosley, Anneth P. Date of Service: 03/09/2018 8:45 AM Medical Record Number: [id number] Patient Account Number: [id number] Date of Birth/Sex: Aug 14, 1940 (78 y.o. F) Treating RN: Cornell Barman Primary Care Aprille Sawhney: Johny Drilling Other Clinician: Referring Delina Kruczek: Johny Drilling Treating Deonna Krummel/Extender: Melburn Hake, HOYT Weeks in Treatment: 0 Compression Therapy Performed for Wound Assessment: Wound #1 Left,Medial Malleolus Performed By: Clinician Cornell Barman, RN Compression Type: Three Layer Pre Treatment ABI: 1.2 Post Procedure Diagnosis Same as Pre-procedure Electronic Signature(s) Signed: 03/10/2018 11:53:49 AM By: Gretta Cool, BSN, RN, CWS, Kim RN, BSN Entered By: Gretta Cool, BSN, RN, CWS, Kim on 03/09/2018 10:07:05 Winders, Fredna Dow ([id number]) -------------------------------------------------------------------------------- Compression Therapy Details Patient Name: Bouknight, Keelee P. Date of Service: 03/09/2018 8:45 AM Medical Record Number: [id number] Patient Account Number: [id number] Date of Birth/Sex: December 01, 1940 (78 y.o. F) Treating RN: Cornell Barman Primary Care Azjah Pardo: Johny Drilling Other Clinician: Referring Gitty Osterlund: Johny Drilling Treating Sharne Linders/Extender: Melburn Hake, HOYT Weeks in Treatment: 0 Compression Therapy Performed for Wound Assessment: Wound #2 Right,Posterior Lower Leg Performed By: Clinician Cornell Barman, RN Compression Type: Three Layer Pre Treatment ABI: 1.2 Post Procedure Diagnosis Same as Pre-procedure Electronic Signature(s) Signed: 03/10/2018 11:53:49 AM By: Gretta Cool, BSN, RN, CWS, Kim RN, BSN Entered By: Gretta Cool, BSN, RN, CWS, Kim on 03/09/2018 10:07:30 Declercq, Fredna Dow ([id number]) -------------------------------------------------------------------------------- Encounter Discharge Information Details Patient Name: Spruiell, Asharia P. Date of Service: 03/09/2018 8:45 AM Medical Record Number: [id number] Patient Account Number: [id number] Date of Birth/Sex: 06-06-1940 (78 y.o. F) Treating RN: Montey Hora Primary Care Letrell Attwood: Johny Drilling Other Clinician: Referring Latrel Szymczak: Johny Drilling Treating Shekina Cordell/Extender: Melburn Hake, HOYT Weeks in Treatment: 0 Encounter Discharge Information Items Discharge Condition: Stable Ambulatory Status: Ambulatory Discharge Destination: Home Transportation: Private Auto Accompanied By: self Schedule Follow-up Appointment: Yes Clinical Summary of Care: Electronic Signature(s) Signed: 03/09/2018 5:02:11 PM By: Montey Hora Entered By: Montey Hora on 03/09/2018 11:12:14 Dobie, Uva P. ([id number]) -------------------------------------------------------------------------------- Lower Extremity Assessment Details Patient Name: Kassing, Suzzanne P. Date of Service: 03/09/2018 8:45 AM Medical Record Number: [id number] Patient Account Number: [id number] Date of Birth/Sex: [birth year] (78 y.o. F) Treating RN: Montey Hora Primary Care Christophere Hillhouse: Johny Drilling Other Clinician: Referring Latreshia Beauchaine: Johny Drilling Treating Adriannah Steinkamp/Extender: Melburn Hake, HOYT Weeks in Treatment: 0 Edema Assessment Assessed: [Left: No] [Right: No] Edema: [Left: Yes] [Right:  Yes] Calf Left: Right: Point of Measurement: 25 cm From Medial Instep 34 cm 34 cm Ankle Left: Right: Point of Measurement: 13 cm From Medial Instep 33 cm 33 cm Vascular Assessment Pulses: Dorsalis Pedis Palpable: [Left:Yes] [Right:Yes] Posterior Tibial Palpable: [Left:Yes] [Right:Yes] Extremity colors, hair growth, and conditions: Extremity Color: [Left:Normal] [Right:Normal] Hair Growth on Extremity: [Left:No] [Right:No] Temperature of Extremity: [Left:Cool] [Right:Cool] Capillary Refill: [Left:< 3 seconds] [Right:< 3 seconds] Toe Nail Assessment Left: Right: Thick: Yes Yes Discolored: Yes Yes Deformed: No No Improper Length and Hygiene: No No Notes ABI [place] BLIATERAL >220 Electronic Signature(s) Signed: 03/09/2018 5:02:11 PM By: Montey Hora Entered By: Montey Hora on 03/09/2018 09:46:32 Mcneary, Ernesha P. ([id number]) -------------------------------------------------------------------------------- Multi Wound Chart Details Patient Name: Okray, Pristine P. Date of Service: 03/09/2018 8:45 AM Medical Record Number: [id number] Patient Account Number: [id number] Date of Birth/Sex:  1941/01/18 (78 y.o. F) Treating RN: Cornell Barman Primary Care Jeniffer Culliver: Johny Drilling Other Clinician: Referring Thanos Cousineau: Johny Drilling Treating Shanena Pellegrino/Extender: Melburn Hake, HOYT Weeks in Treatment: 0 Vital Signs Height(in): 57 Pulse(bpm): 63 Weight(lbs): 216 Blood Pressure(mmHg): 180/81 Body Mass Index(BMI): 34 Temperature(F): 97.6 Respiratory Rate 16 (breaths/min): Photos: [1:No Photos] [2:No Photos] [N/A:N/A] Wound Location: [1:Left Malleolus - Medial] [2:Right Lower Leg - Posterior] [N/A:N/A] Wounding Event: [1:Gradually Appeared] [2:Gradually Appeared] [N/A:N/A] Primary Etiology: [1: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] [N/A:N/A] Secondary Etiology: [1:N/A] [2:Venous Leg Ulcer] [N/A:N/A] Comorbid History: [1:Cataracts, Angina, Coronary Cataracts,  Angina, Coronary Artery Disease, Hypertension, Artery Disease, Hypertension, Myocardial Infarction, Peripheral Venous Disease, Peripheral Venous Disease, Type II Diabetes, Osteoarthritis,  Neuropathy, Received Chemotherapy, Received Radiation] [2:Myocardial Infarction, Type II Diabetes, Osteoarthritis, Neuropathy, Received Chemotherapy, Received Radiation] [N/A:N/A] Date Acquired: [1:12/07/2017] [2:12/07/2017] [N/A:N/A] Weeks of Treatment: [1:0] [2:0] [N/A:N/A] Wound Status: [1:Open] [2:Open] [N/A:N/A] Measurements L x W x D [1:1x0.8x0.1] [2:0.5x0.6x0.1] [N/A:N/A] (cm) Area (cm) : [1:0.628] [2:0.236] [N/A:N/A] Volume (cm) : [1:0.063] [2:0.024] [N/A:N/A] Classification: [1:Full Thickness Without Exposed Support Structures] [2:Grade 1] [N/A:N/A] Exudate Amount: [1:Large] [2:Large] [N/A:N/A] Exudate Type: [1:Serous] [2:Serous] [N/A:N/A] Exudate Color: [1:amber] [2:amber] [N/A:N/A] Wound Margin: [1:Flat and Intact] [2:Flat and Intact] [N/A:N/A] Granulation Amount: [1:Large (67-100%)] [2:Large (67-100%)] [N/A:N/A] Granulation Quality: [1:Pink] [2:Pink] [N/A:N/A] Necrotic Amount: [1:None Present (0%)] [2:None Present (0%)] [N/A:N/A] Exposed Structures: [1:Fat Layer (Subcutaneous Tissue) Exposed: Yes Fascia: No Tendon: No Muscle: No Joint: No Bone: No] [2:Fat Layer (Subcutaneous Tissue) Exposed: Yes Fascia: No Tendon: No Muscle: No Joint: No Bone: No] [N/A:N/A] Epithelialization: None None N/A Periwound Skin Texture: Excoriation: No Excoriation: No N/A Induration: No Induration: No Callus: No Callus: No Crepitus: No Crepitus: No Rash: No Rash: No Scarring: No Scarring: No Periwound Skin Moisture: Maceration: No Maceration: No N/A Dry/Scaly: No Dry/Scaly: No Periwound Skin Color: Atrophie Blanche: No Atrophie Blanche: No N/A Cyanosis: No Cyanosis: No Ecchymosis: No Ecchymosis: No Erythema: No Erythema: No Hemosiderin Staining: No Hemosiderin Staining: No Mottled:  No Mottled: No Pallor: No Pallor: No Rubor: No Rubor: No Temperature: No Abnormality No Abnormality N/A Tenderness on Palpation: Yes Yes N/A Wound Preparation: Ulcer Cleansing: Other: soap Ulcer Cleansing: Other: soap N/A and water and water Topical Anesthetic Applied: Topical Anesthetic Applied: Other: lidocaine 4% Other: lidocaine 4% Treatment Notes Electronic Signature(s) Signed: 03/10/2018 11:53:49 AM By: Gretta Cool, BSN, RN, CWS, Kim RN, BSN Entered By: Gretta Cool, BSN, RN, CWS, Kim on 03/09/2018 10:06:17 Trixie Rude ([id number]) -------------------------------------------------------------------------------- Multi-Disciplinary Care Plan Details Patient Name: Mitchener, Khara P. Date of Service: 03/09/2018 8:45 AM Medical Record Number: [id number] Patient Account Number: [id number] Date of Birth/Sex: 25-May-1940 (78 y.o. F) Treating RN: Cornell Barman Primary Care Algis Lehenbauer: Johny Drilling Other Clinician: Referring Ladavia Lindenbaum: Johny Drilling Treating Inge Waldroup/Extender: Melburn Hake, HOYT Weeks in Treatment: 0 Active Inactive Abuse / Safety / Falls / Self Care Management Nursing Diagnoses: Potential for falls Goals: Patient will remain injury free related to falls Date Initiated: 03/09/2018 Target Resolution Date: 03/27/2018 Goal Status: Active Interventions: Assess fall risk on admission and as needed Notes: Medication Nursing Diagnoses: Knowledge deficit related to medication safety: actual or potential Goals: Patient/caregiver will demonstrate understanding of all current medications Date Initiated: 03/09/2018 Target Resolution Date: 03/27/2018 Goal Status: Active Interventions: Assess for medication contraindications each visit where new medications are prescribed Notes: Orientation to the Wound Care Program Nursing Diagnoses: Knowledge deficit related to the wound healing center program Goals: Patient/caregiver will verbalize understanding of the [place] Date Initiated: 03/09/2018 Target Resolution Date: 03/27/2018  Goal Status: Active Interventions: Provide education on orientation to the wound center Etheredge, Somaya P. ([id number]) Notes: Venous Leg Ulcer Nursing Diagnoses: Potential for venous Insuffiency (use before diagnosis confirmed) Goals: Non-invasive venous studies are completed as ordered Date Initiated: 03/09/2018 Target Resolution Date: 03/18/2018 Goal Status: Active Interventions: Assess peripheral edema status every visit. Treatment Activities: Non-invasive vascular studies : 03/09/2018 Venous Duplex Doppler : 03/18/2018 Notes: Wound/Skin Impairment Nursing Diagnoses: Impaired tissue integrity Goals: Patient/caregiver will verbalize understanding of skin care regimen Date Initiated: 03/09/2018 Target Resolution Date: 03/27/2018 Goal Status: Active Interventions: Assess patient/caregiver ability to obtain necessary supplies Treatment Activities: Patient referred to home care : 03/09/2018 Notes: Electronic Signature(s) Signed: 03/10/2018 11:53:49 AM By: Gretta Cool, BSN, RN, CWS, Kim RN, BSN Entered By: Gretta Cool, BSN, RN, CWS, Kim on 03/09/2018 10:06:02 Zambrana, Laiya P. ([id number]) -------------------------------------------------------------------------------- Pain Assessment Details Patient Name: Balaguer, Ryelee P. Date of Service: 03/09/2018 8:45 AM Medical Record Number: [id number] Patient Account Number: [id number] Date of Birth/Sex: Feb 01, 1940 (78 y.o. F) Treating RN: Cornell Barman Primary Care Jevaun Strick: Johny Drilling Other Clinician: Referring Joycelin Radloff: Johny Drilling Treating Hanaan Gancarz/Extender: Melburn Hake, HOYT Weeks in Treatment: 0 Active Problems Location of Pain Severity and Description of Pain Patient Has Paino No Site Locations Pain Management and Medication Current Pain Management: Electronic Signature(s) Signed: 03/09/2018 4:24:19 PM By: Paulla Fore, RRT, CHT Signed: 03/10/2018  11:53:49 AM By: Gretta Cool, BSN, RN, CWS, Kim RN, BSN Entered By: Lorine Bears on 03/09/2018 09:02:06 Orsini, Fredna Dow ([id number]) -------------------------------------------------------------------------------- Patient/Caregiver Education Details Patient Name: Swopes, Lyna P. Date of Service: 03/09/2018 8:45 AM Medical Record Number: [id number] Patient Account Number: [id number] Date of Birth/Gender: [birth year] (78 y.o. F) Treating RN: Cornell Barman Primary Care Physician: Johny Drilling Other Clinician: Referring Physician: Johny Drilling Treating Physician/Extender: Melburn Hake, HOYT Weeks in Treatment: 0 Education Assessment Education Provided To: Patient Education Topics Provided Venous: Handouts: Controlling Swelling with Multilayered Compression Wraps Methods: Demonstration, Explain/Verbal Responses: State content correctly Welcome To The [facility]: Handouts: Welcome To [facility] [place] Methods: Demonstration, Explain/Verbal Responses: State content correctly Wound/Skin Impairment: Handouts: Caring for Your Ulcer Methods: Demonstration, Explain/Verbal Responses: State content correctly Electronic Signature(s) Signed: 03/10/2018 11:53:49 AM By: Gretta Cool, BSN, RN, CWS, Kim RN, BSN Entered By: Gretta Cool, BSN, RN, CWS, Kim on 03/09/2018 10:13:02 Bordas, Fredna Dow ([id number]) -------------------------------------------------------------------------------- Wound Assessment Details Patient Name: Tolson, Noor P. Date of Service: 03/09/2018 8:45 AM Medical Record Number: [id number] Patient Account Number: [id number] Date of Birth/Sex: 21-May-1940 (78 y.o. F) Treating RN: Montey Hora Primary Care Vedanshi Massaro: Johny Drilling Other Clinician: Referring Emelynn Rance: Johny Drilling Treating Laparis Durrett/Extender: Melburn Hake, HOYT Weeks in Treatment: 0 Wound Status Wound Number: 1 Primary Venous Leg Ulcer Etiology: Wound Location: Left Malleolus - Medial Wound Open Wounding  Event: Gradually Appeared Status: Date Acquired: 12/07/2017 Comorbid Cataracts, Angina, Coronary Artery Disease, Weeks Of Treatment: 0 History: Hypertension, Myocardial Infarction, Peripheral Clustered Wound: No Venous Disease, Type II Diabetes, Osteoarthritis, Neuropathy, Received Chemotherapy, Received Radiation Photos Photo Uploaded By: Army Melia on 03/09/2018 12:40:30 Wound Measurements Length: (cm) 1 Width: (cm) 0.8 Depth: (cm) 0.1 Area: (cm) 0.628 Volume: (cm) 0.063 % Reduction in Area: % Reduction in Volume: Epithelialization: None Tunneling: No Undermining: No Wound Description Full Thickness Without Exposed Support Foul Odo Classification: Structures Slough/F Wound Margin: Flat and Intact Exudate Large Amount: Exudate Type: Serous Exudate Color: amber r After Cleansing: No ibrino No Wound Bed Granulation Amount: Large (67-100%) Exposed Structure Granulation Quality: Pink Fascia Exposed: No Necrotic Amount: None Present (0%) Fat Layer (Subcutaneous Tissue)  Exposed: Yes Tendon Exposed: No Muscle Exposed: No Velie, Elissa P. ([id number]) Joint Exposed: No Bone Exposed: No Periwound Skin Texture Texture Color No Abnormalities Noted: No No Abnormalities Noted: No Callus: No Atrophie Blanche: No Crepitus: No Cyanosis: No Excoriation: No Ecchymosis: No Induration: No Erythema: No Rash: No Hemosiderin Staining: No Scarring: No Mottled: No Pallor: No Moisture Rubor: No No Abnormalities Noted: No Dry / Scaly: No Temperature / Pain Maceration: No Temperature: No Abnormality Tenderness on Palpation: Yes Wound Preparation Ulcer Cleansing: Other: soap and water, Topical Anesthetic Applied: Other: lidocaine 4%, Electronic Signature(s) Signed: 03/09/2018 5:02:11 PM By: Montey Hora Entered By: Montey Hora on 03/09/2018 09:36:48 Burkholder, Saarah P.  ([id number]) -------------------------------------------------------------------------------- Wound Assessment Details Patient Name: Hartt, Nanie P. Date of Service: 03/09/2018 8:45 AM Medical Record Number: [id number] Patient Account Number: [id number] Date of Birth/Sex: 25-Jul-1940 (78 y.o. F) Treating RN: Montey Hora Primary Care Dareld Mcauliffe: Johny Drilling Other Clinician: Referring Conlee Sliter: Johny Drilling Treating Jewell Haught/Extender: Melburn Hake, HOYT Weeks in Treatment: 0 Wound Status Wound Number: 2 Primary Diabetic Wound/Ulcer of the Lower Extremity Etiology: Wound Location: Right Lower Leg - Posterior Secondary Venous Leg Ulcer Wounding Event: Gradually Appeared Etiology: Date Acquired: 12/07/2017 Wound Open Weeks Of Treatment: 0 Status: Clustered Wound: No Comorbid Cataracts, Angina, Coronary Artery Disease, History: Hypertension, Myocardial Infarction, Peripheral Venous Disease, Type II Diabetes, Osteoarthritis, Neuropathy, Received Chemotherapy, Received Radiation Photos Photo Uploaded By: Army Melia on 03/09/2018 12:41:18 Wound Measurements Length: (cm) 0.5 Width: (cm) 0.6 Depth: (cm) 0.1 Area: (cm) 0.236 Volume: (cm) 0.024 % Reduction in Area: % Reduction in Volume: Epithelialization: None Tunneling: No Undermining: No Wound Description Classification: Grade 1 Foul Odo Wound Margin: Flat and Intact Slough/F Exudate Amount: Large Exudate Type: Serous Exudate Color: amber r After Cleansing: No ibrino No Wound Bed Granulation Amount: Large (67-100%) Exposed Structure Granulation Quality: Pink Fascia Exposed: No Necrotic Amount: None Present (0%) Fat Layer (Subcutaneous Tissue) Exposed: Yes Tendon Exposed: No Muscle Exposed: No [place], Hartley P. ([id number]) Joint Exposed: No Bone Exposed: No Periwound Skin Texture Texture Color No Abnormalities Noted: No No Abnormalities Noted: No Callus: No Atrophie Blanche: No Crepitus: No Cyanosis:  No Excoriation: No Ecchymosis: No Induration: No Erythema: No Rash: No Hemosiderin Staining: No Scarring: No Mottled: No Pallor: No Moisture Rubor: No No Abnormalities Noted: No Dry / Scaly: No Temperature / Pain Maceration: No Temperature: No Abnormality Tenderness on Palpation: Yes Wound Preparation Ulcer Cleansing: Other: soap and water, Topical Anesthetic Applied: Other: lidocaine 4%, Electronic Signature(s) Signed: 03/09/2018 5:02:11 PM By: Montey Hora Entered By: Montey Hora on 03/09/2018 09:39:32 Kidney, Aashna Mamie Nick ([id number]) -------------------------------------------------------------------------------- Vitals Details Patient Name: Gehl, Daysi P. Date of Service: 03/09/2018 8:45 AM Medical Record Number: [id number] Patient Account Number: [id number] Date of Birth/Sex: 1940/12/23 (78 y.o. F) Treating RN: Cornell Barman Primary Care Shaquitta Burbridge: Johny Drilling Other Clinician: Referring Carrson Lightcap: Johny Drilling Treating Joanmarie Tsang/Extender: Melburn Hake, HOYT Weeks in Treatment: 0 Vital Signs Time Taken: 09:02 Temperature (F): 97.6 Height (in): 67 Pulse (bpm): 79 Source: Stated Respiratory Rate (breaths/min): 16 Weight (lbs): 216 Blood Pressure (mmHg): 180/81 Source: Stated Reference Range: 80 - 120 mg / dl Body Mass Index (BMI): 33.8 Airway Electronic Signature(s) Signed: 03/09/2018 4:24:19 PM By: Lorine Bears RCP, RRT, CHT Entered By: Lorine Bears on 03/09/2018 09:03:04

## 2018-03-11 NOTE — Progress Notes (Signed)
ASHANI, PUMPHREY ([id number]) Visit Report for 03/09/2018 Chief Complaint Document Details Patient Name: Theresa [name], Theresa [name]. Date of Service: 03/09/2018 8:45 AM Medical Record Number: [id number] Patient Account Number: [id number] Date of Birth/Sex: [birth year] (78 y.o. F) Treating RN: Cornell Barman Primary Care Provider: Johny Drilling Other Clinician: Referring Provider: Johny Drilling Treating Provider/Extender: Melburn Hake, HOYT Weeks in Treatment: 0 Information Obtained from: Patient Chief Complaint Bilateral LE ulcers Electronic Signature(s) Signed: 03/10/2018 5:08:21 PM By: Worthy Keeler PA-C Entered By: Worthy Keeler on 03/09/2018 09:58:00 Bernardy, Ravneet P. ([id number]) -------------------------------------------------------------------------------- HPI Details Patient Name: [name], [name] Date of Service: 03/09/2018 8:45 AM Medical Record Number: [id number] Patient Account Number: [id number] Date of Birth/Sex: 1940/08/16 (78 y.o. F) Treating RN: Cornell Barman Primary Care Provider: Johny Drilling Other Clinician: Referring Provider: Johny Drilling Treating Provider/Extender: Melburn Hake, HOYT Weeks in Treatment: 0 History of Present Illness HPI Description: 03/09/18 on evaluation today patient presents for initial inspection in our clinic concerning issues that she has been having with bilateral lower extremity ulcers. She has lymphedema as well as chronic venous stasis. She has seen Dr. dew where she's undergone ABI testing with a left ABI 1.28 a right ABI 1.22. Subsequently she is supposed to be undergoing venous studies as well which are upcoming. She does have a history of diabetes, chronic venous stasis, peripheral vascular disease, and hypertension. Currently these wounds which are currently draining are stated to have been open for roughly 2 weeks or so. It does not appear to be any evidence of infection. No fevers, chills, nausea, or vomiting noted at this time. With that being  said the patient does have some prominence to the ankle medially especially on the left side and I'm not sure if something was rubbing which calls this issue either. She is unable to get around and move very well simply due to the fact that she has an issue with her right hip as well which unfortunately prevents her from being able to move around very much. She did have an [place] placed at the request of her daughter when she was at vascular this seems to may be potentially helped a little bit. Electronic Signature(s) Signed: 03/10/2018 5:08:21 PM By: Worthy Keeler PA-C Entered By: Worthy Keeler on 03/10/2018 09:15:38 Mom, Chabely Mamie Nick ([id number]) -------------------------------------------------------------------------------- Physical Exam Details Patient Name: [name], [name] Date of Service: 03/09/2018 8:45 AM Medical Record Number: [id number] Patient Account Number: [id number] Date of Birth/Sex: 1940/11/21 (78 y.o. F) Treating RN: Cornell Barman Primary Care Provider: Johny Drilling Other Clinician: Referring Provider: Johny Drilling Treating Provider/Extender: Melburn Hake, HOYT Weeks in Treatment: 0 Constitutional patient is hypertensive.. pulse regular and within target range for patient.Marland Kitchen respirations regular, non-labored and within target range for patient.Marland Kitchen temperature within target range for patient.. Well-nourished and well-hydrated in no acute distress. Eyes conjunctiva clear no eyelid edema noted. pupils equal round and reactive to light and accommodation. Ears, Nose, Mouth, and Throat no gross abnormality of ear auricles or external auditory canals. normal hearing noted during conversation. mucus membranes moist. Respiratory normal breathing without difficulty. clear to auscultation bilaterally. Cardiovascular regular rate and rhythm with normal S1, S2. 2+ pitting edema of the bilateral lower extremities. Gastrointestinal (GI) soft, non-tender, non-distended, +BS. no  ventral hernia noted. Musculoskeletal Patient unable to walk without assistance due to right hip pain. She needs a replacement. Psychiatric this patient is able to make decisions and demonstrates good insight into disease process. Alert and Oriented x 3. pleasant and cooperative. Notes Patient's  wounds currently do show some swelling of the bilateral lower extremities and drainage especially on the right. I think that she would tolerate compression wraps and this would likely be the best thing to try to get the areas to completely heal and close for her at this point. She is definitely in agreement with that plan. Subsequently we did go ahead and initiate that as part of the treatment today. We need to get this healed as quickly as possible so that she can have her hip replacement surgery and hopefully be able to walk and get around more efficiently. Electronic Signature(s) Signed: 03/10/2018 5:08:21 PM By: Worthy Keeler PA-C Entered By: Worthy Keeler on 03/10/2018 09:17:14 Oberman, Fredna Dow ([id number]) -------------------------------------------------------------------------------- Physician Orders Details Patient Name: [name], [name] Date of Service: 03/09/2018 8:45 AM Medical Record Number: [id number] Patient Account Number: [id number] Date of Birth/Sex: [birth year] (78 y.o. F) Treating RN: Cornell Barman Primary Care Provider: Johny Drilling Other Clinician: Referring Provider: Johny Drilling Treating Provider/Extender: Melburn Hake, HOYT Weeks in Treatment: 0 Verbal / Phone Orders: No Diagnosis Coding ICD-10 Coding Code Description E11.622 Type 2 diabetes mellitus with other skin ulcer I87.2 Venous insufficiency (chronic) (peripheral) L97.322 Non-pressure chronic ulcer of left ankle with fat layer exposed L97.812 Non-pressure chronic ulcer of other part of right lower leg with fat layer exposed I73.89 Other specified peripheral vascular diseases I10 Essential (primary)  hypertension Wound Cleansing Wound #1 Left,Medial Malleolus o Clean wound with Normal Saline. o Cleanse wound with mild soap and water o May Shower, gently pat wound dry prior to applying new dressing. Wound #2 Right,Posterior Lower Leg o Clean wound with Normal Saline. o Cleanse wound with mild soap and water o May Shower, gently pat wound dry prior to applying new dressing. Anesthetic (add to Medication List) Wound #1 Left,Medial Malleolus o Topical Lidocaine 4% cream applied to wound bed prior to debridement (In Clinic Only). Wound #2 Right,Posterior Lower Leg o Topical Lidocaine 4% cream applied to wound bed prior to debridement (In Clinic Only). Skin Barriers/Peri-Wound Care Wound #1 Left,Medial Malleolus o Triamcinolone Acetonide Ointment (TCA) Wound #2 Right,Posterior Lower Leg o Triamcinolone Acetonide Ointment (TCA) Primary Wound Dressing Wound #1 Left,Medial Malleolus o Silver Alginate Wound #2 Right,Posterior Lower Leg o Silver Alginate Postiglione, Lyndsey P. ([id number]) Secondary Dressing Wound #1 Left,Medial Malleolus o ABD pad Wound #2 Right,Posterior Lower Leg o ABD pad Dressing Change Frequency Wound #1 Left,Medial Malleolus o Change dressing every week Wound #2 Right,Posterior Lower Leg o Change dressing every week Follow-up Appointments Wound #1 Left,Medial Malleolus o Return Appointment in 1 week. o Nurse Visit as needed - Thursday Wound #2 Right,Posterior Lower Leg o Return Appointment in 1 week. o Nurse Visit as needed - Thursday Edema Control Wound #1 Left,Medial Malleolus o 3 Layer Compression System - Bilateral Wound #2 Right,Posterior Lower Leg o 3 Layer Compression System - Bilateral Additional Orders / Instructions Wound #1 Left,Medial Malleolus o Increase protein intake. Wound #2 Right,Posterior Lower Leg o Increase protein intake. Services and Therapies o Venous Studies -Bilateral - [facility]  03/18/2018 Electronic Signature(s) Signed: 03/10/2018 11:53:49 AM By: Gretta Cool, BSN, RN, CWS, Kim RN, BSN Signed: 03/10/2018 5:08:21 PM By: Worthy Keeler PA-C Entered By: Gretta Cool, BSN, RN, CWS, Kim on 03/09/2018 10:11:34 Kreider, Shanya PMarland Kitchen ([id number]) -------------------------------------------------------------------------------- Problem List Details Patient Name: Mick, Andreia P. Date of Service: 03/09/2018 8:45 AM Medical Record Number: [id number] Patient Account Number: [id number] Date of Birth/Sex: Mar 14, 1940 (78 y.o. F) Treating RN: Cornell Barman Primary Care Provider: Johny Drilling  Other Clinician: Referring Provider: Johny Drilling Treating Provider/Extender: Melburn Hake, HOYT Weeks in Treatment: 0 Active Problems ICD-10 Evaluated Encounter Code Description Active Date Today Diagnosis E11.622 Type 2 diabetes mellitus with other skin ulcer 03/09/2018 No Yes I87.2 Venous insufficiency (chronic) (peripheral) 03/09/2018 No Yes L97.322 Non-pressure chronic ulcer of left ankle with fat layer 03/09/2018 No Yes exposed L97.812 Non-pressure chronic ulcer of other part of right lower leg 03/09/2018 No Yes with fat layer exposed I73.89 Other specified peripheral vascular diseases 03/09/2018 No Yes I10 Essential (primary) hypertension 03/09/2018 No Yes Inactive Problems Resolved Problems Electronic Signature(s) Signed: 03/10/2018 5:08:21 PM By: Worthy Keeler PA-C Entered By: Worthy Keeler on 03/10/2018 08:55:27 Villasenor, Sherronda P. ([id number]) -------------------------------------------------------------------------------- Progress Note Details Patient Name: Coombs, Myrl P. Date of Service: 03/09/2018 8:45 AM Medical Record Number: [id number] Patient Account Number: [id number] Date of Birth/Sex: 04-11-1940 (78 y.o. F) Treating RN: Cornell Barman Primary Care Provider: Johny Drilling Other Clinician: Referring Provider: Johny Drilling Treating Provider/Extender: Melburn Hake, HOYT Weeks in Treatment:  0 Subjective Chief Complaint Information obtained from Patient Bilateral LE ulcers History of Present Illness (HPI) 03/09/18 on evaluation today patient presents for initial inspection in our clinic concerning issues that she has been having with bilateral lower extremity ulcers. She has lymphedema as well as chronic venous stasis. She has seen Dr. dew where she's undergone ABI testing with a left ABI 1.28 a right ABI 1.22. Subsequently she is supposed to be undergoing venous studies as well which are upcoming. She does have a history of diabetes, chronic venous stasis, peripheral vascular disease, and hypertension. Currently these wounds which are currently draining are stated to have been open for roughly 2 weeks or so. It does not appear to be any evidence of infection. No fevers, chills, nausea, or vomiting noted at this time. With that being said the patient does have some prominence to the ankle medially especially on the left side and I'm not sure if something was rubbing which calls this issue either. She is unable to get around and move very well simply due to the fact that she has an issue with her right hip as well which unfortunately prevents her from being able to move around very much. She did have an [place] placed at the request of her daughter when she was at vascular this seems to may be potentially helped a little bit. Wound History Patient presents with 3 open wounds that have been present for approximately months. Patient has been treating wounds in the following manner: unna boots. Laboratory tests have not been performed in the last month. Patient reportedly has not tested positive for an antibiotic resistant organism. Patient reportedly has not tested positive for osteomyelitis. Patient reportedly has not had testing performed to evaluate circulation in the legs. Patient experiences the following problems associated with their wounds: swelling. Patient  History Information obtained from Patient, Caregiver. Allergies codeine, morphine HCl Social History Never smoker, Marital Status - Married, Alcohol Use - Never, Drug Use - No History, Caffeine Use - Never. Medical History Eyes Patient has history of Cataracts - removed Denies history of Glaucoma, Optic Neuritis Ear/Nose/Mouth/Throat Denies history of Chronic sinus problems/congestion, Middle ear problems Hematologic/Lymphatic Denies history of Anemia, Hemophilia, Human Immunodeficiency Virus, Lymphedema, Sickle Cell Disease Respiratory Denies history of Aspiration, Asthma, Chronic Obstructive Pulmonary Disease (COPD), Pneumothorax, Sleep Apnea, Tuberculosis Cardiovascular Galster, Meriel P. ([id number]) Patient has history of Angina, Coronary Artery Disease, Hypertension, Myocardial Infarction - more than 5 years ago, Peripheral Venous Disease Denies  history of Arrhythmia, Congestive Heart Failure, Deep Vein Thrombosis, Hypotension, Peripheral Arterial Disease, Phlebitis, Vasculitis Gastrointestinal Denies history of Cirrhosis , Colitis, Crohn s, Hepatitis A, Hepatitis B, Hepatitis C Endocrine Patient has history of Type II Diabetes Denies history of Type I Diabetes Genitourinary Denies history of End Stage Renal Disease Immunological Denies history of Lupus Erythematosus, Raynaud s, Scleroderma Integumentary (Skin) Denies history of History of Burn, History of pressure wounds Musculoskeletal Patient has history of Osteoarthritis Denies history of Gout, Rheumatoid Arthritis, Osteomyelitis Neurologic Patient has history of Neuropathy Denies history of Dementia, Quadriplegia, Paraplegia, Seizure Disorder Oncologic Patient has history of Received Chemotherapy, Received Radiation Psychiatric Denies history of Anorexia/bulimia, Confinement Anxiety Medical And Surgical History Notes Genitourinary CKD stage 3 Oncologic 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 Review of Systems (ROS) Constitutional Symptoms (General Health) Denies complaints or symptoms of Fatigue, Fever, Chills, Marked Weight Change. Eyes Complains or has symptoms of Glasses / Contacts - glasses. Denies complaints or symptoms of Dry Eyes, Vision Changes. Ear/Nose/Mouth/Throat Denies complaints or symptoms of Difficult clearing ears, Sinusitis. Hematologic/Lymphatic Denies complaints or symptoms of Bleeding / Clotting Disorders, Human Immunodeficiency Virus. Respiratory Denies complaints or symptoms of Chronic or frequent coughs, Shortness of Breath. Cardiovascular Complains or has symptoms of LE edema. Denies complaints or symptoms of Chest pain. Gastrointestinal Denies complaints or symptoms of Frequent diarrhea, Nausea, Vomiting. Endocrine Denies complaints or symptoms of Hepatitis, Thyroid disease, Polydypsia (Excessive Thirst). Genitourinary Complains or has symptoms of Kidney failure/ Dialysis - CKD stage 3. Denies complaints or symptoms of Incontinence/dribbling. Immunological Denies complaints or symptoms of Hives, Itching. Integumentary (Skin) Complains or has symptoms of Wounds. Denies complaints or symptoms of Bleeding or bruising tendency, Breakdown, Swelling. Musculoskeletal Buckner, Zuriah P. ([id number]) Denies complaints or symptoms of Muscle Pain, Muscle Weakness. Neurologic Denies complaints or symptoms of Numbness/parasthesias, Focal/Weakness. Psychiatric Denies complaints or symptoms of Anxiety, Claustrophobia. Objective Constitutional patient is hypertensive.. pulse regular and within target range for patient.Marland Kitchen respirations regular, non-labored and within target range for patient.Marland Kitchen temperature within target range for patient.. Well-nourished and well-hydrated in no acute distress. Vitals Time Taken: 9:02 AM, Height: 67 in, Source: Stated, Weight: 216 lbs, Source: Stated, BMI: 33.8, Temperature: 97.6 F, Pulse: 79 bpm, Respiratory Rate:  16 breaths/min, Blood Pressure: 180/81 mmHg. Eyes conjunctiva clear no eyelid edema noted. pupils equal round and reactive to light and accommodation. Ears, Nose, Mouth, and Throat no gross abnormality of ear auricles or external auditory canals. normal hearing noted during conversation. mucus membranes moist. Respiratory normal breathing without difficulty. clear to auscultation bilaterally. Cardiovascular regular rate and rhythm with normal S1, S2. 2+ pitting edema of the bilateral lower extremities. Gastrointestinal (GI) soft, non-tender, non-distended, +BS. no ventral hernia noted. Musculoskeletal Patient unable to walk without assistance due to right hip pain. She needs a replacement. Psychiatric this patient is able to make decisions and demonstrates good insight into disease process. Alert and Oriented x 3. pleasant and cooperative. General Notes: Patient's wounds currently do show some swelling of the bilateral lower extremities and drainage especially on the right. I think that she would tolerate compression wraps and this would likely be the best thing to try to get the areas to completely heal and close for her at this point. She is definitely in agreement with that plan. Subsequently we did go ahead and initiate that as part of the treatment today. We need to get this healed as quickly as possible so that she can have her hip replacement surgery and  hopefully be able to walk and get around more efficiently. Integumentary (Hair, Skin) Wound #1 status is Open. Original cause of wound was Gradually Appeared. The wound is located on the Left,Medial Malleolus. The wound measures 1cm length x 0.8cm width x 0.1cm depth; 0.628cm^2 area and 0.063cm^3 volume. There is Fat Layer (Subcutaneous Tissue) Exposed exposed. There is no tunneling or undermining noted. There is a large amount of serous drainage noted. The wound margin is flat and intact. There is large (67-100%) pink granulation  within the wound bed. Necaise, Shamya P. ([id number]) There is no necrotic tissue within the wound bed. The periwound skin appearance did not exhibit: Callus, Crepitus, Excoriation, Induration, Rash, Scarring, Dry/Scaly, Maceration, Atrophie Blanche, Cyanosis, Ecchymosis, Hemosiderin Staining, Mottled, Pallor, Rubor, Erythema. Periwound temperature was noted as No Abnormality. The periwound has tenderness on palpation. Wound #2 status is Open. Original cause of wound was Gradually Appeared. The wound is located on the Right,Posterior Lower Leg. The wound measures 0.5cm length x 0.6cm width x 0.1cm depth; 0.236cm^2 area and 0.024cm^3 volume. There is Fat Layer (Subcutaneous Tissue) Exposed exposed. There is no tunneling or undermining noted. There is a large amount of serous drainage noted. The wound margin is flat and intact. There is large (67-100%) pink granulation within the wound bed. There is no necrotic tissue within the wound bed. The periwound skin appearance did not exhibit: Callus, Crepitus, Excoriation, Induration, Rash, Scarring, Dry/Scaly, Maceration, Atrophie Blanche, Cyanosis, Ecchymosis, Hemosiderin Staining, Mottled, Pallor, Rubor, Erythema. Periwound temperature was noted as No Abnormality. The periwound has tenderness on palpation. 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 There was a Three Layer Compression Therapy Procedure with a pre-treatment ABI of 1.2 by Cornell Barman, RN. Post procedure Diagnosis Wound #1: Same as  Pre-Procedure Wound #2 Pre-procedure diagnosis of Wound #2 is a Diabetic Wound/Ulcer of the Lower Extremity located on the Right,Posterior Lower Leg . There was a Three Layer Compression Therapy Procedure with a pre-treatment ABI of 1.2 by Cornell Barman, RN. Post procedure Diagnosis Wound #2: Same as Pre-Procedure Plan Wound Cleansing: Wound #1 Left,Medial Malleolus: Clean wound with Normal Saline. Cleanse wound with mild soap and water Hammac, Travia P. ([id number]) May Shower, gently pat wound dry prior to applying new dressing. Wound #2 Right,Posterior Lower Leg: Clean wound with Normal Saline. Cleanse wound with mild soap and water May Shower, gently pat wound dry prior to applying new dressing. Anesthetic (add to Medication List): Wound #1 Left,Medial Malleolus: Topical Lidocaine 4% cream applied to wound bed prior to debridement (In Clinic Only). Wound #2 Right,Posterior Lower Leg: Topical Lidocaine 4% cream applied to wound bed prior to debridement (In Clinic Only). Skin Barriers/Peri-Wound Care: Wound #1 Left,Medial Malleolus: Triamcinolone Acetonide Ointment (TCA) Wound #2 Right,Posterior Lower Leg: Triamcinolone Acetonide Ointment (TCA) Primary Wound Dressing: Wound #1 Left,Medial Malleolus: Silver Alginate Wound #2 Right,Posterior Lower Leg: Silver Alginate Secondary Dressing: Wound #1 Left,Medial Malleolus: ABD pad Wound #2 Right,Posterior Lower Leg: ABD pad Dressing Change Frequency: Wound #1 Left,Medial Malleolus: Change dressing every week Wound #2 Right,Posterior Lower Leg: Change dressing every week Follow-up Appointments: Wound #1 Left,Medial Malleolus: Return Appointment in 1 week. Nurse Visit  as needed - Thursday Wound #2 Right,Posterior Lower Leg: Return Appointment in 1 week. Nurse Visit as needed - Thursday Edema Control: Wound #1 Left,Medial Malleolus: 3 Layer Compression System - Bilateral Wound #2 Right,Posterior Lower Leg: 3 Layer  Compression System - Bilateral Additional Orders / Instructions: Wound #1 Left,Medial Malleolus: Increase protein intake. Wound #2 Right,Posterior Lower Leg: Increase protein intake. Services and Therapies ordered were: Venous Studies -Bilateral - [facility] 03/18/2018 My suggestion at this point is gonna be that we go ahead and initiate the above wound care measures for the next week. The patient is in agreement with the plan. Her daughter likewise is also in agreement. My hope is that the three layer compression wrap will provide additional control of her swelling without having any issues in that regard. This should hopefully get the wounds to heal and she will be able to get to the point of having the hip replacement surgery which I think would help her in general. If anything changes in the meantime they will contact the office and let us know. Compston, Alvenia P. ([id number]) Please see above for specific wound care orders. We will see patient for re-evaluation in 1 week(s) here in the clinic. If anything worsens or changes patient will contact our office for additional recommendations. Electronic Signature(s) Signed: 03/10/2018 5:08:21 PM By: Worthy Keeler PA-C Entered By: Worthy Keeler on 03/10/2018 09:18:32 Yorio, Pema PMarland Kitchen ([id number]) -------------------------------------------------------------------------------- ROS/PFSH Details Patient Name: Herard, Galilee P. Date of Service: 03/09/2018 8:45 AM Medical Record Number: [id number] Patient Account Number: [id number] Date of Birth/Sex: 1940-08-13 (78 y.o. F) Treating RN: Montey Hora Primary Care Provider: Johny Drilling Other Clinician: Referring Provider: Johny Drilling Treating Provider/Extender: Melburn Hake, HOYT Weeks in Treatment: 0 Information Obtained From Patient Caregiver Wound History Do you currently have one or more open woundso Yes How many open wounds do you currently haveo 3 Approximately how long have you had your  woundso months How have you been treating your wound(s) until nowo unna boots Has your wound(s) ever healed and then re-openedo No Have you had any lab work done in the past montho No Have you tested positive for an antibiotic resistant organism (MRSA, VRE)o No Have you tested positive for osteomyelitis (bone infection)o No Have you had any tests for circulation on your legso No Have you had other problems associated with your woundso Swelling Constitutional Symptoms (General Health) Complaints and Symptoms: Negative for: Fatigue; Fever; Chills; Marked Weight Change Eyes Complaints and Symptoms: Positive for: Glasses / Contacts - glasses Negative for: Dry Eyes; Vision Changes Medical History: Positive for: Cataracts - removed Negative for: Glaucoma; Optic Neuritis Ear/Nose/Mouth/Throat Complaints and Symptoms: Negative for: Difficult clearing ears; Sinusitis Medical History: Negative for: Chronic sinus problems/congestion; Middle ear problems Hematologic/Lymphatic Complaints and Symptoms: Negative for: Bleeding / Clotting Disorders; Human Immunodeficiency Virus Medical History: Negative for: Anemia; Hemophilia; Human Immunodeficiency Virus; Lymphedema; Sickle Cell Disease Respiratory Complaints and Symptoms: Negative for: Chronic or frequent coughs; Shortness of Breath Bady, Legacy P. ([id number]) Medical History: Negative for: Aspiration; Asthma; Chronic Obstructive Pulmonary Disease (COPD); Pneumothorax; Sleep Apnea; Tuberculosis Cardiovascular Complaints and Symptoms: Positive for: LE edema Negative for: Chest pain Medical History: Positive for: Angina; Coronary Artery Disease; Hypertension; Myocardial Infarction - more than 5 years ago; Peripheral Venous Disease Negative for: Arrhythmia; Congestive Heart Failure; Deep Vein Thrombosis; Hypotension; Peripheral Arterial Disease; Phlebitis; Vasculitis Gastrointestinal Complaints and Symptoms: Negative for: Frequent  diarrhea; Nausea; Vomiting Medical History: Negative for: Cirrhosis ; Colitis; Crohnos; Hepatitis A; Hepatitis B; Hepatitis C  Endocrine Complaints and Symptoms: Negative for: Hepatitis; Thyroid disease; Polydypsia (Excessive Thirst) Medical History: Positive for: Type II Diabetes Negative for: Type I Diabetes Genitourinary Complaints and Symptoms: Positive for: Kidney failure/ Dialysis - CKD stage 3 Negative for: Incontinence/dribbling Medical History: Negative for: End Stage Renal Disease Past Medical History Notes: CKD stage 3 Immunological Complaints and Symptoms: Negative for: Hives; Itching Medical History: Negative for: Lupus Erythematosus; Raynaudos; Scleroderma Integumentary (Skin) Complaints and Symptoms: Positive for: Wounds Negative for: Bleeding or bruising tendency; Breakdown; Swelling Kipp, Hayla P. ([id number]) Medical History: Negative for: History of Burn; History of pressure wounds Musculoskeletal Complaints and Symptoms: Negative for: Muscle Pain; Muscle Weakness Medical History: Positive for: Osteoarthritis Negative for: Gout; Rheumatoid Arthritis; Osteomyelitis Neurologic Complaints and Symptoms: Negative for: Numbness/parasthesias; Focal/Weakness Medical History: Positive for: Neuropathy Negative for: Dementia; Quadriplegia; Paraplegia; Seizure Disorder Psychiatric Complaints and Symptoms: Negative for: Anxiety; Claustrophobia Medical History: Negative for: Anorexia/bulimia; Confinement Anxiety Oncologic Medical History: Positive for: Received Chemotherapy; Received Radiation Past Medical History Notes: 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 HBO Extended History Items Eyes: Cataracts Immunizations Pneumococcal Vaccine: Received Pneumococcal Vaccination: Yes Implantable Devices Family and Social History Never smoker; Marital Status - Married; Alcohol Use: Never; Drug Use: No History; Caffeine Use: Never; Financial  Concerns: No; Food, Clothing or Shelter Needs: No; Support System Lacking: No; Transportation Concerns: No; Advanced Directives: No; Patient does not want information on Advanced Directives Electronic Signature(s) Signed: 03/09/2018 5:02:11 PM By: Montey Hora Signed: 03/10/2018 5:08:21 PM By: Worthy Keeler PA-C Entered By: Montey Hora on 03/09/2018 09:25:27 Blackstock, Markel P. ([id number]) -------------------------------------------------------------------------------- SuperBill Details Patient Name: Kinch, Hideko P. Date of Service: 03/09/2018 Medical Record Number: [id number] Patient Account Number: [id number] Date of Birth/Sex: May 19, 1940 (78 y.o. 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 Physician Procedures CPT4 Code Description: [id number] [place] PHYS LEVEL 3 o NEW PT ICD-10 Diagnosis Description E11.622 Type 2 diabetes mellitus with other skin ulcer I87.2 Venous insufficiency (chronic) (peripheral) L97.322 Non-pressure chronic ulcer of left ankle with fat layer  expo L97.812 Non-pressure chronic ulcer of other part of right lower leg Modifier: sed with fat layer expos Quantity: 1 ed Electronic  Signature(s) Signed: 03/10/2018 5:08:21 PM By: Worthy Keeler PA-C Entered By: Worthy Keeler on 03/10/2018 08:47:42

## 2018-03-12 NOTE — Progress Notes (Signed)
SADAE, ARRAZOLA ([id number]) Visit Report for 03/11/2018 Arrival Information Details Patient Name: Theresa [name], Theresa [name]. Date of Service: 03/11/2018 10:45 AM Medical Record Number: [id number] Patient Account Number: [id number] Date of Birth/Sex: 1940/10/15 (78 y.o. F) Treating RN: Secundino Ginger Primary Care Breane Grunwald: Johny Drilling Other Clinician: Referring Aparna Vanderweele: Johny Drilling Treating Raza Bayless/Extender: Tito Dine in Treatment: 0 Visit Information History Since Last Visit Added or deleted any medications: No Patient Arrived: Wheel Chair Any new allergies or adverse reactions: No Arrival Time: 10:44 Had a fall or experienced change in No Accompanied By: daughter activities of daily living that may affect Transfer Assistance: Other risk of falls: Patient Has Alerts: Yes Signs or symptoms of abuse/neglect since last visito No Patient Alerts: Patient on Blood Thinner Hospitalized since last visit: No Plavix, 81mg  aspirin Implantable device outside of the clinic excluding No Type II Diabetic cellular tissue based products placed in the center 03/06/2018 ABI AVVS since last visit: (L) 1.28 (R) 1.22 Has Dressing in Place as Prescribed: Yes Has Compression in Place as Prescribed: Yes Pain Present Now: No Electronic Signature(s) Signed: 03/11/2018 1:44:33 PM By: Secundino Ginger Entered By: Secundino Ginger on 03/11/2018 10:45:46 Mcgowen, Ahlia P. ([id number]) -------------------------------------------------------------------------------- Encounter Discharge Information Details Patient Name: Theresa [name], [name] Date of Service: 03/11/2018 10:45 AM Medical Record Number: [id number] Patient Account Number: [id number] Date of Birth/Sex: July 19, 1940 (78 y.o. F) Treating RN: Secundino Ginger Primary Care Kimeka Badour: Johny Drilling Other Clinician: Referring Westlynn Fifer: Johny Drilling Treating Demetrica Zipp/Extender: Tito Dine in Treatment: 0 Encounter Discharge Information Items Discharge  Condition: Stable Ambulatory Status: Wheelchair Discharge Destination: Home Transportation: Private Auto Accompanied By: DTR Schedule Follow-up Appointment: Yes Clinical Summary of Care: Electronic Signature(s) Signed: 03/11/2018 1:44:33 PM By: Secundino Ginger Entered By: Secundino Ginger on 03/11/2018 11:24:42 Peden, Myrian Mamie Nick ([id number]) -------------------------------------------------------------------------------- Patient/Caregiver Education Details Patient Name: Theresa [name], [name] Date of Service: 03/11/2018 10:45 AM Medical Record Number: [id number] Patient Account Number: [id number] Date of Birth/Gender: 1940-11-25 (78 y.o. F) Treating RN: Secundino Ginger Primary Care Physician: Johny Drilling Other Clinician: Referring Physician: Johny Drilling Treating Physician/Extender: Tito Dine in Treatment: 0 Education Assessment Education Provided To: Patient and Caregiver Education Topics Provided Medication Safety: Methods: Explain/Verbal Responses: Return demonstration correctly Electronic Signature(s) Signed: 03/11/2018 1:44:33 PM By: Secundino Ginger Entered By: Secundino Ginger on 03/11/2018 11:24:19 Badolato, Naveya P. ([id number]) -------------------------------------------------------------------------------- Wound Assessment Details Patient Name: Theresa [name], [name] Date of Service: 03/11/2018 10:45 AM Medical Record Number: [id number] Patient Account Number: [id number] Date of Birth/Sex: 06-02-1940 (78 y.o. F) Treating RN: Secundino Ginger Primary Care Rober Skeels: Johny Drilling Other Clinician: Referring Codylee Patil: Johny Drilling Treating Courtney Fenlon/Extender: Tito Dine in Treatment: 0 Wound Status Wound Number: 1 Primary Diabetic Wound/Ulcer of the Lower Extremity Etiology: Wound Location: Left Malleolus - Medial Secondary Venous Leg Ulcer Wounding Event: Gradually Appeared Etiology: Date Acquired: 12/07/2017 Wound Open Weeks Of Treatment: 0 Status: Clustered Wound: No Comorbid  Cataracts, Angina, Coronary Artery Disease, History: Hypertension, Myocardial Infarction, Peripheral Venous Disease, Type II Diabetes, Osteoarthritis, Neuropathy, Received Chemotherapy, Received Radiation Photos Photo Uploaded By: Army Melia on 03/12/2018 08:26:01 Wound Measurements Length: (cm) 1 Width: (cm) 0.8 Depth: (cm) 0.1 Area: (cm) 0.628 Volume: (cm) 0.063 % Reduction in Area: 0% % Reduction in Volume: 0% Epithelialization: None Tunneling: No Undermining: No Wound Description Classification: Grade 2 Foul Od Wound Margin: Flat and Intact Slough/ Exudate Amount: Medium Exudate Type: Serous Exudate Color: amber or After Cleansing: No Fibrino No Wound Bed Granulation Amount: None Present (0%) Exposed Structure Necrotic Amount:  Small (1-33%) Fascia Exposed: No Necrotic Quality: Adherent Slough Fat Layer (Subcutaneous Tissue) Exposed: Yes Tendon Exposed: No Muscle Exposed: No Theresa [name], [name] ([id number]) Joint Exposed: No Bone Exposed: No Periwound Skin Texture Texture Color No Abnormalities Noted: No No Abnormalities Noted: No Callus: No Atrophie Blanche: No Crepitus: No Cyanosis: No Excoriation: No Ecchymosis: No Induration: No Erythema: No Rash: No Hemosiderin Staining: No Scarring: No Mottled: No Pallor: No Moisture Rubor: No No Abnormalities Noted: No Dry / Scaly: No Temperature / Pain Maceration: No Temperature: No Abnormality Tenderness on Palpation: Yes Wound Preparation Ulcer Cleansing: Other: soap and water, Topical Anesthetic Applied: None Treatment Notes Wound #1 (Left, Medial Malleolus) 1. Cleansed with: Other cleanser (specify in notes) 7. Secured with 3 Layer Compression System - Left Lower Extremity 3 Layer Compression System - Right Lower Extremity Notes TCA,SILVERCEL,3 LAYER WRAP BILATERAL ,STOCKING AND TAPE Electronic Signature(s) Signed: 03/11/2018 1:44:33 PM By: Secundino Ginger Entered By: Secundino Ginger on 03/11/2018  11:01:06 Theresa [name], [name] ([id number]) -------------------------------------------------------------------------------- Wound Assessment Details Patient Name: Theresa [name], [name] Date of Service: 03/11/2018 10:45 AM Medical Record Number: [id number] Patient Account Number: [id number] Date of Birth/Sex: 1940/12/19 (78 y.o. F) Treating RN: Secundino Ginger Primary Care Madiha Bambrick: Johny Drilling Other Clinician: Referring Stark Aguinaga: Johny Drilling Treating Steele Stracener/Extender: Tito Dine in Treatment: 0 Wound Status Wound Number: 2 Primary Diabetic Wound/Ulcer of the Lower Extremity Etiology: Wound Location: Right Lower Leg - Posterior Secondary Venous Leg Ulcer Wounding Event: Gradually Appeared Etiology: Date Acquired: 12/07/2017 Wound Open Weeks Of Treatment: 0 Status: Clustered Wound: No Comorbid Cataracts, Angina, Coronary Artery Disease, History: Hypertension, Myocardial Infarction, Peripheral Venous Disease, Type II Diabetes, Osteoarthritis, Neuropathy, Received Chemotherapy, Received Radiation Photos Photo Uploaded By: Army Melia on 03/12/2018 08:26:01 Wound Measurements Length: (cm) 0.5 Width: (cm) 0.6 Depth: (cm) 0.1 Area: (cm) 0.236 Volume: (cm) 0.024 % Reduction in Area: 0% % Reduction in Volume: 0% Epithelialization: None Tunneling: No Undermining: No Wound Description Classification: Grade 1 Foul Od Wound Margin: Flat and Intact Slough/ Exudate Amount: Medium Exudate Type: Serous Exudate Color: amber or After Cleansing: No Fibrino No Wound Bed Granulation Amount: None Present (0%) Exposed Structure Necrotic Amount: None Present (0%) Fascia Exposed: No Fat Layer (Subcutaneous Tissue) Exposed: Yes Tendon Exposed: No Muscle Exposed: No Schussler, Ameerah P. ([id number]) Joint Exposed: No Bone Exposed: No Periwound Skin Texture Texture Color No Abnormalities Noted: No No Abnormalities Noted: No Callus: No Atrophie Blanche: No Crepitus:  No Cyanosis: No Excoriation: No Ecchymosis: No Induration: No Erythema: No Rash: No Hemosiderin Staining: No Scarring: No Mottled: No Pallor: No Moisture Rubor: No No Abnormalities Noted: No Dry / Scaly: No Temperature / Pain Maceration: No Temperature: No Abnormality Tenderness on Palpation: Yes Wound Preparation Ulcer Cleansing: Other: soap and water, Topical Anesthetic Applied: None Treatment Notes Wound #2 (Right, Posterior Lower Leg) 1. Cleansed with: Other cleanser (specify in notes) 7. Secured with 3 Layer Compression System - Left Lower Extremity 3 Layer Compression System - Right Lower Extremity Notes TCA,SILVERCEL,3 LAYER WRAP BILATERAL ,STOCKING AND TAPE Electronic Signature(s) Signed: 03/11/2018 1:44:33 PM By: Secundino Ginger Entered By: Secundino Ginger on 03/11/2018 11:02:38

## 2018-03-16 ENCOUNTER — Encounter: Payer: Medicare HMO | Admitting: Physician Assistant

## 2018-03-16 DIAGNOSIS — E11622 Type 2 diabetes mellitus with other skin ulcer: Secondary | ICD-10-CM | POA: Diagnosis not present

## 2018-03-18 ENCOUNTER — Ambulatory Visit (INDEPENDENT_AMBULATORY_CARE_PROVIDER_SITE_OTHER): Payer: Medicare HMO | Admitting: Nurse Practitioner

## 2018-03-18 ENCOUNTER — Encounter (INDEPENDENT_AMBULATORY_CARE_PROVIDER_SITE_OTHER): Payer: Medicare HMO

## 2018-03-18 NOTE — Progress Notes (Signed)
KEMI, GELL ([id number]) Visit Report for 03/16/2018 Chief Complaint Document Details Patient Name: JALEENA, VIVIANI. Date of Service: 03/16/2018 9:15 AM Medical Record Number: [id number] Patient Account Number: [id number] Date of Birth/Sex: Jul 01, 1940 (78 y.o. F) Treating RN: Harold Barban Primary Care Provider: Johny Drilling Other Clinician: Referring Provider: Johny Drilling Treating Provider/Extender: Melburn Hake, HOYT Weeks in Treatment: 1 Information Obtained from: Patient Chief Complaint Bilateral LE ulcers Electronic Signature(s) Signed: 03/17/2018 12:02:18 AM By: Worthy Keeler PA-C Entered By: Worthy Keeler on 03/16/2018 09:50:37 Gentry, Gwenneth P. ([id number]) -------------------------------------------------------------------------------- Debridement Details Patient Name: Martensen, Marianne P. Date of Service: 03/16/2018 9:15 AM Medical Record Number: [id number] Patient Account Number: [id number] Date of Birth/Sex: 1940/04/30 (78 y.o. F) Treating RN: Harold Barban Primary Care Provider: Johny Drilling Other Clinician: Referring Provider: Johny Drilling Treating Provider/Extender: Melburn Hake, HOYT Weeks in Treatment: 1 Debridement Performed for Wound #1 Left,Medial Malleolus Assessment: Performed By: Physician STONE III, HOYT E., PA-C Debridement Type: Debridement Severity of Tissue Pre Fat layer exposed Debridement: Level of Consciousness (Pre- Awake and Alert procedure): Pre-procedure Verification/Time Yes - 09:57 Out Taken: Start Time: 09:57 Pain Control: Lidocaine Total Area Debrided (L x W): 0.7 (cm) x 0.5 (cm) = 0.35 (cm) Tissue and other material Non-Viable, Slough, Subcutaneous, Slough debrided: Level: Skin/Subcutaneous Tissue Debridement Description: Excisional Instrument: Curette Bleeding: Minimum Hemostasis Achieved: Pressure End Time: 09:59 Procedural Pain: 0 Post Procedural Pain: 0 Response to Treatment: Procedure was tolerated  well Level of Consciousness Awake and Alert (Post-procedure): Post Debridement Measurements of Total Wound Length: (cm) 0.7 Width: (cm) 0.5 Depth: (cm) 0.1 Volume: (cm) 0.027 Character of Wound/Ulcer Post Debridement: Improved Severity of Tissue Post Debridement: Fat layer exposed Post Procedure Diagnosis Same as Pre-procedure Electronic Signature(s) Signed: 03/17/2018 12:02:18 AM By: Worthy Keeler PA-C Signed: 03/18/2018 8:37:22 AM By: Harold Barban Entered By: Worthy Keeler on 03/16/2018 10:10:56 Nifong, Tameika P. ([id number]) -------------------------------------------------------------------------------- HPI Details Patient Name: Renfrow, Michaelia P. Date of Service: 03/16/2018 9:15 AM Medical Record Number: [id number] Patient Account Number: [id number] Date of Birth/Sex: October 08, 1940 (78 y.o. F) Treating RN: Harold Barban Primary Care Provider: Johny Drilling Other Clinician: Referring Provider: Johny Drilling Treating Provider/Extender: Melburn Hake, HOYT Weeks in Treatment: 1 History of Present Illness HPI Description: 03/09/18 on evaluation today patient presents for initial inspection in our clinic concerning issues that she has been having with bilateral lower extremity ulcers. She has lymphedema as well as chronic venous stasis. She has seen Dr. dew where she's undergone ABI testing with a left ABI 1.28 a right ABI 1.22. Subsequently she is supposed to be undergoing venous studies as well which are upcoming. She does have a history of diabetes, chronic venous stasis, peripheral vascular disease, and hypertension. Currently these wounds which are currently draining are stated to have been open for roughly 2 weeks or so. It does not appear to be any evidence of infection. No fevers, chills, nausea, or vomiting noted at this time. With that being said the patient does have some prominence to the ankle medially especially on the left side and I'm not sure if something was  rubbing which calls this issue either. She is unable to get around and move very well simply due to the fact that she has an issue with her right hip as well which unfortunately prevents her from being able to move around very much. She did have an [place] placed at the request of her daughter when she was at vascular this seems to may be potentially helped a  little bit. 03/16/18 on evaluation today patient's right lower extremity appears to likely be completely healed although there still small area of concern which I'm gonna continue to watch. The really does not appear to be any weeping at this point which is good news. Overall I feel like she is done excellent. In regard to left lower extremity this is a little larger still than the right although again the swelling is indeed down. The wound over the media malleolus does appear to be doing better which is good news. No fevers, chills, nausea, or vomiting noted at this time. Unfortunately the patient still continues to have right hip pain which is getting more severe. Again her surgeon did not want to undergo surgery until everything was healed as far as the wounds of the right lower extremity. Electronic Signature(s) Signed: 03/17/2018 12:02:18 AM By: Worthy Keeler PA-C Entered By: Worthy Keeler on 03/16/2018 10:08:53 Villagran, Saory Mamie Nick ([id number]) -------------------------------------------------------------------------------- Physical Exam Details Patient Name: Cancio, Ronniesha P. Date of Service: 03/16/2018 9:15 AM Medical Record Number: [id number] Patient Account Number: [id number] Date of Birth/Sex: [birth year] (78 y.o. F) Treating RN: Harold Barban Primary Care Provider: Johny Drilling Other Clinician: Referring Provider: Johny Drilling Treating Provider/Extender: Melburn Hake, HOYT Weeks in Treatment: 1 Constitutional Well-nourished and well-hydrated in no acute distress. Respiratory normal breathing without  difficulty. Psychiatric this patient is able to make decisions and demonstrates good insight into disease process. Alert and Oriented x 3. pleasant and cooperative. Notes Patient's wound bed currently shows evidence of good epithelialization on the right posterior lower extremity. Fortunately this seems to be doing very well. The left lower extremity is doing better there still some Slough on the surface the wound which did require sharp debridement today this was performed without complication post debridement the wound bed appears to be doing much better. Electronic Signature(s) Signed: 03/17/2018 12:02:18 AM By: Worthy Keeler PA-C Entered By: Worthy Keeler on 03/16/2018 10:09:23 Trixie Rude ([id number]) -------------------------------------------------------------------------------- Physician Orders Details Patient Name: Tory, Anyelina P. Date of Service: 03/16/2018 9:15 AM Medical Record Number: [id number] Patient Account Number: [id number] Date of Birth/Sex: Sep 01, 1940 (78 y.o. F) Treating RN: Harold Barban Primary Care Provider: Johny Drilling Other Clinician: Referring Provider: Johny Drilling Treating Provider/Extender: Melburn Hake, HOYT Weeks in Treatment: 1 Verbal / Phone Orders: No Diagnosis Coding ICD-10 Coding Code Description E11.622 Type 2 diabetes mellitus with other skin ulcer I87.2 Venous insufficiency (chronic) (peripheral) L97.322 Non-pressure chronic ulcer of left ankle with fat layer exposed L97.812 Non-pressure chronic ulcer of other part of right lower leg with fat layer exposed I73.89 Other specified peripheral vascular diseases I10 Essential (primary) hypertension Wound Cleansing Wound #1 Left,Medial Malleolus o Clean wound with Normal Saline. o Cleanse wound with mild soap and water o May Shower, gently pat wound dry prior to applying new dressing. Wound #2 Right,Posterior Lower Leg o Clean wound with Normal Saline. o Cleanse wound  with mild soap and water o May Shower, gently pat wound dry prior to applying new dressing. Anesthetic (add to Medication List) Wound #1 Left,Medial Malleolus o Topical Lidocaine 4% cream applied to wound bed prior to debridement (In Clinic Only). Wound #2 Right,Posterior Lower Leg o Topical Lidocaine 4% cream applied to wound bed prior to debridement (In Clinic Only). Skin Barriers/Peri-Wound Care Wound #1 Left,Medial Malleolus o Triamcinolone Acetonide Ointment (TCA) Wound #2 Right,Posterior Lower Leg o Triamcinolone Acetonide Ointment (TCA) Primary Wound Dressing Wound #1 Left,Medial Malleolus o Silver Alginate Wound #2 Right,Posterior Lower Leg o Silver  Alginate Rosenow, Jobina P. ([id number]) Secondary Dressing Wound #1 Left,Medial Malleolus o ABD pad Wound #2 Right,Posterior Lower Leg o ABD pad Dressing Change Frequency Wound #1 Left,Medial Malleolus o Change dressing every week Wound #2 Right,Posterior Lower Leg o Change dressing every week Follow-up Appointments Wound #1 Left,Medial Malleolus o Return Appointment in 1 week. o Nurse Visit as needed - Thursday Wound #2 Right,Posterior Lower Leg o Return Appointment in 1 week. Edema Control Wound #1 Left,Medial Malleolus o 3 Layer Compression System - Bilateral - Unna to anchor Wound #2 Right,Posterior Lower Leg o 3 Layer Compression System - Bilateral Additional Orders / Instructions Wound #1 Left,Medial Malleolus o Increase protein intake. Wound #2 Right,Posterior Lower Leg o Increase protein intake. Notes Order Wallie Char 4000 Bilaterally Electronic Signature(s) Signed: 03/17/2018 12:02:18 AM By: Worthy Keeler PA-C Signed: 03/18/2018 8:37:22 AM By: Harold Barban Entered By: Harold Barban on 03/16/2018 10:02:56 Tabora, Pattie P. ([id number]) -------------------------------------------------------------------------------- Problem List Details Patient Name: Braaten, Lakely  P. Date of Service: 03/16/2018 9:15 AM Medical Record Number: [id number] Patient Account Number: [id number] Date of Birth/Sex: 18-Nov-1940 (78 y.o. F) Treating RN: Harold Barban Primary Care Provider: Johny Drilling Other Clinician: Referring Provider: Johny Drilling Treating Provider/Extender: Melburn Hake, HOYT Weeks in Treatment: 1 Active Problems ICD-10 Evaluated Encounter Code Description Active Date Today Diagnosis E11.622 Type 2 diabetes mellitus with other skin ulcer 03/09/2018 No Yes I87.2 Venous insufficiency (chronic) (peripheral) 03/09/2018 No Yes L97.322 Non-pressure chronic ulcer of left ankle with fat layer 03/09/2018 No Yes exposed L97.812 Non-pressure chronic ulcer of other part of right lower leg 03/09/2018 No Yes with fat layer exposed I73.89 Other specified peripheral vascular diseases 03/09/2018 No Yes I10 Essential (primary) hypertension 03/09/2018 No Yes Inactive Problems Resolved Problems Electronic Signature(s) Signed: 03/17/2018 12:02:18 AM By: Worthy Keeler PA-C Entered By: Worthy Keeler on 03/16/2018 09:50:33 Napierala, Sunita P. ([id number]) -------------------------------------------------------------------------------- Progress Note Details Patient Name: Mausolf, Sakshi P. Date of Service: 03/16/2018 9:15 AM Medical Record Number: [id number] Patient Account Number: [id number] Date of Birth/Sex: 1940-04-03 (78 y.o. F) Treating RN: Harold Barban Primary Care Provider: Johny Drilling Other Clinician: Referring Provider: Johny Drilling Treating Provider/Extender: Melburn Hake, HOYT Weeks in Treatment: 1 Subjective Chief Complaint Information obtained from Patient Bilateral LE ulcers History of Present Illness (HPI) 03/09/18 on evaluation today patient presents for initial inspection in our clinic concerning issues that she has been having with bilateral lower extremity ulcers. She has lymphedema as well as chronic venous stasis. She has seen Dr. dew where  she's undergone ABI testing with a left ABI 1.28 a right ABI 1.22. Subsequently she is supposed to be undergoing venous studies as well which are upcoming. She does have a history of diabetes, chronic venous stasis, peripheral vascular disease, and hypertension. Currently these wounds which are currently draining are stated to have been open for roughly 2 weeks or so. It does not appear to be any evidence of infection. No fevers, chills, nausea, or vomiting noted at this time. With that being said the patient does have some prominence to the ankle medially especially on the left side and I'm not sure if something was rubbing which calls this issue either. She is unable to get around and move very well simply due to the fact that she has an issue with her right hip as well which unfortunately prevents her from being able to move around very much. She did have an [place] placed at the request of her daughter when she was at vascular this seems to  may be potentially helped a little bit. 03/16/18 on evaluation today patient's right lower extremity appears to likely be completely healed although there still small area of concern which I'm gonna continue to watch. The really does not appear to be any weeping at this point which is good news. Overall I feel like she is done excellent. In regard to left lower extremity this is a little larger still than the right although again the swelling is indeed down. The wound over the media malleolus does appear to be doing better which is good news. No fevers, chills, nausea, or vomiting noted at this time. Unfortunately the patient still continues to have right hip pain which is getting more severe. Again her surgeon did not want to undergo surgery until everything was healed as far as the wounds of the right lower extremity. Patient History Information obtained from Patient. Social History Never smoker, Marital Status - Married, Alcohol Use - Never, Drug Use -  No History, Caffeine Use - Never. Medical History Eyes Patient has history of Cataracts - removed Denies history of Glaucoma, Optic Neuritis Ear/Nose/Mouth/Throat Denies history of Chronic sinus problems/congestion, Middle ear problems Hematologic/Lymphatic Denies history of Anemia, Hemophilia, Human Immunodeficiency Virus, Lymphedema, Sickle Cell Disease Respiratory Denies history of Aspiration, Asthma, Chronic Obstructive Pulmonary Disease (COPD), Pneumothorax, Sleep Apnea, Tuberculosis Cardiovascular Patient has history of Angina, Coronary Artery Disease, Hypertension, Myocardial Infarction - more than 5 years ago, Peripheral Venous Disease Denies history of Arrhythmia, Congestive Heart Failure, Deep Vein Thrombosis, Hypotension, Peripheral Arterial Disease, Gose, Carey P. ([id number]) Phlebitis, Vasculitis Gastrointestinal Denies history of Cirrhosis , Colitis, Crohn s, Hepatitis A, Hepatitis B, Hepatitis C Endocrine Patient has history of Type II Diabetes Denies history of Type I Diabetes Genitourinary Denies history of End Stage Renal Disease Immunological Denies history of Lupus Erythematosus, Raynaud s, Scleroderma Integumentary (Skin) Denies history of History of Burn, History of pressure wounds Musculoskeletal Patient has history of Osteoarthritis Denies history of Gout, Rheumatoid Arthritis, Osteomyelitis Neurologic Patient has history of Neuropathy Denies history of Dementia, Quadriplegia, Paraplegia, Seizure Disorder Oncologic Patient has history of Received Chemotherapy, Received Radiation Psychiatric Denies history of Anorexia/bulimia, Confinement Anxiety Medical And Surgical History Notes Genitourinary CKD stage 3 Oncologic 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 Review of Systems (ROS) Constitutional Symptoms (General Health) Denies complaints or symptoms of Fever, Chills. Respiratory The patient has no complaints or  symptoms. Cardiovascular Complains or has symptoms of LE edema. Psychiatric The patient has no complaints or symptoms. Objective Constitutional Well-nourished and well-hydrated in no acute distress. Vitals Time Taken: 9:19 AM, Height: 67 in, Weight: 216 lbs, BMI: 33.8, Temperature: 97.6 F, Pulse: 85 bpm, Respiratory Rate: 16 breaths/min, Blood Pressure: 152/79 mmHg. Respiratory normal breathing without difficulty. Psychiatric MACIAH, FEEBACK. ([id number]) this patient is able to make decisions and demonstrates good insight into disease process. Alert and Oriented x 3. pleasant and cooperative. General Notes: Patient's wound bed currently shows evidence of good epithelialization on the right posterior lower extremity. Fortunately this seems to be doing very well. The left lower extremity is doing better there still some Slough on the surface the wound which did require sharp debridement today this was performed without complication post debridement the wound bed appears to be doing much better. Integumentary (Hair, Skin) Wound #1 status is Open. Original cause of wound was Gradually Appeared. The wound is located on the Left,Medial Malleolus. The wound measures 0.7cm length x 0.5cm width x 0.1cm depth; 0.275cm^2 area and 0.027cm^3 volume. There is  Fat Layer (Subcutaneous Tissue) Exposed exposed. There is no tunneling or undermining noted. There is a medium amount of serous drainage noted. The wound margin is flat and intact. There is no granulation within the wound bed. There is a small (1-33%) amount of necrotic tissue within the wound bed including Adherent Slough. The periwound skin appearance did not exhibit: Callus, Crepitus, Excoriation, Induration, Rash, Scarring, Dry/Scaly, Maceration, Atrophie Blanche, Cyanosis, Ecchymosis, Hemosiderin Staining, Mottled, Pallor, Rubor, Erythema. Periwound temperature was noted as No Abnormality. The periwound has tenderness on palpation. Wound  #2 status is Open. Original cause of wound was Gradually Appeared. The wound is located on the Right,Posterior Lower Leg. The wound measures 0.4cm length x 0.4cm width x 0.1cm depth; 0.126cm^2 area and 0.013cm^3 volume. There is Fat Layer (Subcutaneous Tissue) Exposed exposed. There is no tunneling or undermining noted. There is a medium amount of serous drainage noted. The wound margin is flat and intact. There is no granulation within the wound bed. There is no necrotic tissue within the wound bed. The periwound skin appearance did not exhibit: Callus, Crepitus, Excoriation, Induration, Rash, Scarring, Dry/Scaly, Maceration, Atrophie Blanche, Cyanosis, Ecchymosis, Hemosiderin Staining, Mottled, Pallor, Rubor, Erythema. Periwound temperature was noted as No Abnormality. The periwound has tenderness on palpation. 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 There was a Excisional Skin/Subcutaneous Tissue Debridement with a total area of 0.35 sq cm performed by STONE III, HOYT E., PA-C. With the following instrument(s): Curette to remove Non-Viable tissue/material. Material removed includes Subcutaneous Tissue and Slough and after achieving pain control using Lidocaine. No specimens were taken. A time out was conducted at 09:57, prior to the start of the procedure. A Minimum amount of bleeding was controlled with Pressure. The procedure was tolerated well with a pain level of 0 throughout and a  pain level of 0 following the procedure. Post Debridement Measurements: 0.7cm length x 0.5cm width x 0.1cm depth; 0.027cm^3 volume. IAN, CAVEY ([id number]) Character of Wound/Ulcer Post Debridement is improved. Severity of Tissue Post Debridement is: Fat layer exposed. Post procedure Diagnosis Wound #1: Same as Pre-Procedure Plan Wound Cleansing: Wound #1 Left,Medial Malleolus: Clean wound with Normal Saline. Cleanse wound with mild soap and water May Shower, gently pat wound dry prior to applying new dressing. Wound #2 Right,Posterior Lower Leg: Clean wound with Normal Saline. Cleanse wound with mild soap and water May Shower, gently pat wound dry prior to applying new dressing. Anesthetic (add to Medication List): Wound #1 Left,Medial Malleolus: Topical Lidocaine 4% cream applied to wound bed prior to debridement (In Clinic Only). Wound #2 Right,Posterior Lower Leg: Topical Lidocaine 4% cream applied to wound bed prior to debridement (In Clinic Only). Skin Barriers/Peri-Wound Care: Wound #1 Left,Medial Malleolus: Triamcinolone Acetonide Ointment (TCA) Wound #2 Right,Posterior Lower Leg: Triamcinolone Acetonide Ointment (TCA) Primary Wound Dressing: Wound #1 Left,Medial Malleolus: Silver Alginate Wound #2 Right,Posterior Lower Leg: Silver Alginate Secondary Dressing: Wound #1 Left,Medial Malleolus: ABD pad Wound #2 Right,Posterior Lower Leg: ABD pad Dressing Change Frequency: Wound #1 Left,Medial Malleolus: Change dressing every week Wound #2 Right,Posterior Lower Leg: Change dressing every week Follow-up  Appointments: Wound #1 Left,Medial Malleolus: Return Appointment in 1 week. Nurse Visit as needed - Thursday Wound #2 Right,Posterior Lower Leg: Return Appointment in 1 week. Edema Control: Wound #1 Left,Medial Malleolus: 3 Layer Compression System - Bilateral - Unna to anchor Wound #2 Right,Posterior Lower Leg: 3 Layer Compression System -  Bilateral Additional Orders / Instructions: Wound #1 Left,Medial Malleolus: Increase protein intake. Wound #2 Right,Posterior Lower Leg: Arman, Teagan P. ([id number]) Increase protein intake. General Notes: Order Wallie Char 4000 Bilaterally My suggestion currently is gonna be that we go ahead and initiate the above wound care measures for the next week. The patient is in agreement the plan. Subsequently I'm gonna see were things stand at follow-up in one weeks time. At that point I'm hopeful as well that you have the FarrowWrap 4000 wraps which we are gonna order for her. We're also ordering supplies for her to take over changing the dressings on her own as it's actually very difficult for her to get in to see [place] due to the severe hip pain on the right. We will likely stretch out her appointments and allow them to utilize the compression wraps at home. Patient and her daughter in agreement with plan. Subsequently if anything changes in the meantime she let me know I feel like she is an extremely low risk for anything adverse occurring in regard to her right hip replacement especially since her right lower extremity appears to be pretty much completely healed. We will see were things stand at follow-up. Please see above for specific wound care orders. We will see patient for re-evaluation in 1 week(s) here in the clinic. If anything worsens or changes patient will contact our office for additional recommendations. Electronic Signature(s) Signed: 03/17/2018 12:02:18 AM By: Worthy Keeler PA-C Entered By: Worthy Keeler on 03/16/2018 10:11:05 Simerly, Daziyah PMarland Kitchen ([id number]) -------------------------------------------------------------------------------- ROS/PFSH Details Patient Name: Prothero, Kjerstin P. Date of Service: 03/16/2018 9:15 AM Medical Record Number: [id number] Patient Account Number: [id number] Date of Birth/Sex: 03-Jul-1940 (78 y.o. F) Treating RN: Harold Barban Primary Care  Provider: Johny Drilling Other Clinician: Referring Provider: Johny Drilling Treating Provider/Extender: Melburn Hake, HOYT Weeks in Treatment: 1 Information Obtained From Patient Wound History Do you currently have one or more open woundso Yes How many open wounds do you currently haveo 3 Approximately how long have you had your woundso months How have you been treating your wound(s) until nowo unna boots Has your wound(s) ever healed and then re-openedo No Have you had any lab work done in the past montho No Have you tested positive for an antibiotic resistant organism (MRSA, VRE)o No Have you tested positive for osteomyelitis (bone infection)o No Have you had any tests for circulation on your legso No Have you had other problems associated with your woundso Swelling Constitutional Symptoms (General Health) Complaints and Symptoms: Negative for: Fever; Chills Cardiovascular Complaints and Symptoms: Positive for: LE edema Medical History: Positive for: Angina; Coronary Artery Disease; Hypertension; Myocardial Infarction - more than 5 years ago; Peripheral Venous Disease Negative for: Arrhythmia; Congestive Heart Failure; Deep Vein Thrombosis; Hypotension; Peripheral Arterial Disease; Phlebitis; Vasculitis Eyes Medical History: Positive for: Cataracts - removed Negative for: Glaucoma; Optic Neuritis Ear/Nose/Mouth/Throat Medical History: Negative for: Chronic sinus problems/congestion; Middle ear problems Hematologic/Lymphatic Medical History: Negative for: Anemia; Hemophilia; Human Immunodeficiency Virus; Lymphedema; Sickle Cell Disease Respiratory Deleonardis, Vayda P. ([id number]) Complaints and Symptoms: No Complaints or Symptoms Medical History: Negative for: Aspiration; Asthma; Chronic Obstructive Pulmonary Disease (COPD); Pneumothorax; Sleep Apnea; Tuberculosis Gastrointestinal Medical History:  Negative for: Cirrhosis ; Colitis; Crohnos; Hepatitis A; Hepatitis B;  Hepatitis C Endocrine Medical History: Positive for: Type II Diabetes Negative for: Type I Diabetes Genitourinary Medical History: Negative for: End Stage Renal Disease Past Medical History Notes: CKD stage 3 Immunological Medical History: Negative for: Lupus Erythematosus; Raynaudos; Scleroderma Integumentary (Skin) Medical History: Negative for: History of Burn; History of pressure wounds Musculoskeletal Medical History: Positive for: Osteoarthritis Negative for: Gout; Rheumatoid Arthritis; Osteomyelitis Neurologic Medical History: Positive for: Neuropathy Negative for: Dementia; Quadriplegia; Paraplegia; Seizure Disorder Oncologic Medical History: Positive for: Received Chemotherapy; Received Radiation Past Medical History Notes: 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 ([id number]) Medical History: Negative for: Anorexia/bulimia; Confinement Anxiety HBO Extended History Items Eyes: Cataracts Immunizations Pneumococcal Vaccine: Received Pneumococcal Vaccination: Yes Implantable Devices Family and Social History Never smoker; Marital Status - Married; Alcohol Use: Never; Drug Use: No History; Caffeine Use: Never; Financial Concerns: No; Food, Clothing or Shelter Needs: No; Support System Lacking: No; Transportation Concerns: No; Advanced Directives: No; Patient does not want information on Advanced Directives Physician Affirmation I have reviewed and agree with the above information. Electronic Signature(s) Signed: 03/17/2018 12:02:18 AM By: Worthy Keeler PA-C Signed: 03/18/2018 8:37:22 AM By: Harold Barban Entered By: Worthy Keeler on 03/16/2018 10:09:09 Schnabel, Alynn P. ([id number]) -------------------------------------------------------------------------------- SuperBill Details Patient Name: Faison, Katti P. Date of Service: 03/16/2018 Medical Record  Number: [id number] Patient Account Number: [id number] Date of Birth/Sex: 12/20/1940 (78 y.o. F) Treating RN: Harold Barban Primary Care Provider: Johny Drilling Other Clinician: Referring Provider: Johny Drilling Treating Provider/Extender: Melburn Hake, HOYT Weeks in Treatment: 1 Diagnosis Coding ICD-10 Codes Code Description E11.622 Type 2 diabetes mellitus with other skin ulcer I87.2 Venous insufficiency (chronic) (peripheral) L97.322 Non-pressure chronic ulcer of left ankle with fat layer exposed L97.812 Non-pressure chronic ulcer of other part of right lower leg with fat layer exposed I73.89 Other specified peripheral vascular diseases I10 Essential (primary) hypertension Facility Procedures CPT4 Code: 70350093 Description: 81829 - DEB SUBQ TISSUE 20 SQ CM/< ICD-10 Diagnosis Description L97.322 Non-pressure chronic ulcer of left ankle with fat layer expo Modifier: sed Quantity: 1 Physician Procedures CPT4 Code: 9371696 Description: 11042 - WC PHYS SUBQ TISS 20 SQ CM ICD-10 Diagnosis Description V89.381 Non-pressure chronic ulcer of left ankle with fat layer expo Modifier: sed Quantity: 1 Electronic Signature(s) Signed: 03/17/2018 12:02:18 AM By: Worthy Keeler PA-C Entered By: Worthy Keeler on 03/16/2018 10:11:17

## 2018-03-21 NOTE — Progress Notes (Signed)
TAKERA, RAYL ([id number]) Visit Report for 03/16/2018 Arrival Information Details Patient Name: Theresa [name], Theresa [name]. Date of Service: 03/16/2018 9:15 AM Medical Record Number: [id number] Patient Account Number: [id number] Date of Birth/Sex: Apr 20, 1940 (78 y.o. F) Treating RN: Army Melia Primary Care Judie Hollick: Johny Drilling Other Clinician: Referring Annalynne Ibanez: Johny Drilling Treating Lejla Moeser/Extender: Melburn Hake, HOYT Weeks in Treatment: 1 Visit Information History Since Last Visit Added or deleted any medications: Yes Patient Arrived: Wheel Chair Any new allergies or adverse reactions: No Arrival Time: 09:17 Had a fall or experienced change in No Accompanied By: daughter activities of daily living that may affect Transfer Assistance: EasyPivot Patient Lift risk of falls: Patient Identification Verified: Yes Signs or symptoms of abuse/neglect since last visito No Patient Has Alerts: Yes Hospitalized since last visit: No Patient Alerts: Patient on Blood Thinner Implantable device outside of the clinic excluding No Plavix, 81mg  aspirin cellular tissue based products placed in the center Type II Diabetic since last visit: 03/06/2018 [facility] Has Dressing in Place as Prescribed: Yes (L) 1.28 (R) 1.22 Pain Present Now: Yes Electronic Signature(s) Signed: 03/20/2018 4:09:31 PM By: Army Melia Entered By: Army Melia on 03/16/2018 09:18:47 Bayne, Makynzi P. ([id number]) -------------------------------------------------------------------------------- Lower Extremity Assessment Details Patient Name: [name], [name] Date of Service: 03/16/2018 9:15 AM Medical Record Number: [id number] Patient Account Number: [id number] Date of Birth/Sex: 10/13/1940 (78 y.o. F) Treating RN: Army Melia Primary Care Annistyn Depass: Johny Drilling Other Clinician: Referring Larin Depaoli: Johny Drilling Treating Tereka Thorley/Extender: Melburn Hake, HOYT Weeks in Treatment: 1 Edema Assessment Assessed: [Left: No]  [Right: No] Edema: [Left: No] [Right: Yes] Calf Left: Right: Point of Measurement: 25 cm From Medial Instep 35 cm 32 cm Ankle Left: Right: Point of Measurement: 13 cm From Medial Instep 23.5 cm 25 cm Vascular Assessment Pulses: Dorsalis Pedis Palpable: [Left:Yes] [Right:Yes] Posterior Tibial Extremity colors, hair growth, and conditions: Extremity Color: [Left:Normal] [Right:Normal] Hair Growth on Extremity: [Left:No] [Right:No] Temperature of Extremity: [Left:Warm] [Right:Warm] Capillary Refill: [Left:< 3 seconds] [Right:< 3 seconds] Toe Nail Assessment Left: Right: Thick: No No Discolored: No No Deformed: No No Improper Length and Hygiene: No No Electronic Signature(s) Signed: 03/20/2018 4:09:31 PM By: Army Melia Entered By: Army Melia on 03/16/2018 09:35:30 Rachel, Aahna P. ([id number]) -------------------------------------------------------------------------------- Multi Wound Chart Details Patient Name: [name], [name] Date of Service: 03/16/2018 9:15 AM Medical Record Number: [id number] Patient Account Number: [id number] Date of Birth/Sex: October 12, 1940 (78 y.o. F) Treating RN: Harold Barban Primary Care Noralee Dutko: Johny Drilling Other Clinician: Referring Dorrine Montone: Johny Drilling Treating Meng Winterton/Extender: Melburn Hake, HOYT Weeks in Treatment: 1 Vital Signs Height(in): 36 Pulse(bpm): 85 Weight(lbs): 216 Blood Pressure(mmHg): 152/79 Body Mass Index(BMI): 34 Temperature(F): 97.6 Respiratory Rate 16 (breaths/min): Photos: [1:No Photos] [2:No Photos] [N/A:N/A] Wound Location: [1:Left Malleolus - Medial] [2:Right Lower Leg - Posterior] [N/A:N/A] Wounding Event: [1:Gradually Appeared] [2:Gradually Appeared] [N/A:N/A] Primary Etiology: [1:Diabetic Wound/Ulcer of the Lower Extremity] [2:Diabetic Wound/Ulcer of the Lower Extremity] [N/A:N/A] Secondary Etiology: [1:Venous Leg Ulcer] [2:Venous Leg Ulcer] [N/A:N/A] Comorbid History: [1:Cataracts, Angina, Coronary  Artery Disease, Hypertension, Myocardial Infarction, Peripheral Venous Disease, Type II Diabetes, Osteoarthritis, Neuropathy, Received Chemotherapy, Received Radiation] [2:Cataracts, Angina, Coronary  Artery Disease, Hypertension, Myocardial Infarction, Peripheral Venous Disease, Type II Diabetes, Osteoarthritis, Neuropathy, Received Chemotherapy, Received Radiation] [N/A:N/A] Date Acquired: [1:12/07/2017] [2:12/07/2017] [N/A:N/A] Weeks of Treatment: [1:1] [2:1] [N/A:N/A] Wound Status: [1:Open] [2:Open] [N/A:N/A] Measurements L x W x D [1:0.7x0.5x0.1] [2:0.4x0.4x0.1] [N/A:N/A] (cm) Area (cm) : [1:0.275] [2:0.126] [N/A:N/A] Volume (cm) : [1:0.027] [2:0.013] [N/A:N/A] % Reduction in Area: [1:56.20%] [2:46.60%] [N/A:N/A] % Reduction in Volume: [  1:57.10%] [2:45.80%] [N/A:N/A] Classification: [1:Grade 2] [2:Grade 1] [N/A:N/A] Exudate Amount: [1:Medium] [2:Medium] [N/A:N/A] Exudate Type: [1:Serous] [2:Serous] [N/A:N/A] Exudate Color: [1:amber] [2:amber] [N/A:N/A] Wound Margin: [1:Flat and Intact] [2:Flat and Intact] [N/A:N/A] Granulation Amount: [1:None Present (0%)] [2:None Present (0%)] [N/A:N/A] Necrotic Amount: [1:Small (1-33%)] [2:None Present (0%)] [N/A:N/A] Exposed Structures: [1:Fat Layer (Subcutaneous Tissue) Exposed: Yes Fascia: No Tendon: No Muscle: No Joint: No Bone: No] [2:Fat Layer (Subcutaneous Tissue) Exposed: Yes Fascia: No Tendon: No Muscle: No Joint: No Bone: No] [N/A:N/A] Epithelialization: None None N/A Periwound Skin Texture: Excoriation: No Excoriation: No N/A Induration: No Induration: No Callus: No Callus: No Crepitus: No Crepitus: No Rash: No Rash: No Scarring: No Scarring: No Periwound Skin Moisture: Maceration: No Maceration: No N/A Dry/Scaly: No Dry/Scaly: No Periwound Skin Color: Atrophie Blanche: No Atrophie Blanche: No N/A Cyanosis: No Cyanosis: No Ecchymosis: No Ecchymosis: No Erythema: No Erythema: No Hemosiderin Staining:  No Hemosiderin Staining: No Mottled: No Mottled: No Pallor: No Pallor: No Rubor: No Rubor: No Temperature: No Abnormality No Abnormality N/A Tenderness on Palpation: Yes Yes N/A Wound Preparation: Ulcer Cleansing: Other: soap Ulcer Cleansing: Other: soap N/A and water and water Topical Anesthetic Applied: Topical Anesthetic Applied: Other: lidocaine 4% None Treatment Notes Electronic Signature(s) Signed: 03/18/2018 8:37:22 AM By: Harold Barban Entered By: Harold Barban on 03/16/2018 09:42:41 Raczynski, Mariem PMarland Kitchen ([id number]) -------------------------------------------------------------------------------- Multi-Disciplinary Care Plan Details Patient Name: Bennette, Briyonna P. Date of Service: 03/16/2018 9:15 AM Medical Record Number: [id number] Patient Account Number: [id number] Date of Birth/Sex: Oct 08, 1940 (78 y.o. F) Treating RN: Harold Barban Primary Care Daviona Herbert: Johny Drilling Other Clinician: Referring Tryce Surratt: Johny Drilling Treating Mysty Kielty/Extender: Melburn Hake, HOYT Weeks in Treatment: 1 Active Inactive Abuse / Safety / Falls / Self Care Management Nursing Diagnoses: Potential for falls Goals: Patient will remain injury free related to falls Date Initiated: 03/09/2018 Target Resolution Date: 03/27/2018 Goal Status: Active Interventions: Assess fall risk on admission and as needed Notes: Medication Nursing Diagnoses: Knowledge deficit related to medication safety: actual or potential Goals: Patient/caregiver will demonstrate understanding of all current medications Date Initiated: 03/09/2018 Target Resolution Date: 03/27/2018 Goal Status: Active Interventions: Assess for medication contraindications each visit where new medications are prescribed Notes: Orientation to the Wound Care Program Nursing Diagnoses: Knowledge deficit related to the wound healing center program Goals: Patient/caregiver will verbalize understanding of the [facility]  Program Date Initiated: 03/09/2018 Target Resolution Date: 03/27/2018 Goal Status: Active Interventions: Provide education on orientation to the wound center Leonhart, Ranetta P. ([id number]) Notes: Venous Leg Ulcer Nursing Diagnoses: Potential for venous Insuffiency (use before diagnosis confirmed) Goals: Non-invasive venous studies are completed as ordered Date Initiated: 03/09/2018 Target Resolution Date: 03/18/2018 Goal Status: Active Interventions: Assess peripheral edema status every visit. Treatment Activities: Non-invasive vascular studies : 03/09/2018 Venous Duplex Doppler : 03/18/2018 Notes: Wound/Skin Impairment Nursing Diagnoses: Impaired tissue integrity Goals: Patient/caregiver will verbalize understanding of skin care regimen Date Initiated: 03/09/2018 Target Resolution Date: 03/27/2018 Goal Status: Active Interventions: Assess patient/caregiver ability to obtain necessary supplies Treatment Activities: Patient referred to home care : 03/09/2018 Notes: Electronic Signature(s) Signed: 03/18/2018 8:37:22 AM By: Harold Barban Entered By: Harold Barban on 03/16/2018 09:52:58 Mcneil, Sofiah P. ([id number]) -------------------------------------------------------------------------------- Pain Assessment Details Patient Name: Bither, Kmya P. Date of Service: 03/16/2018 9:15 AM Medical Record Number: [id number] Patient Account Number: [id number] Date of Birth/Sex: 1940-08-04 (78 y.o. F) Treating RN: Army Melia Primary Care Annitta Fifield: Johny Drilling Other Clinician: Referring Adisynn Suleiman: Johny Drilling Treating Doyne Ellinger/Extender: Melburn Hake, HOYT Weeks in Treatment: 1 Active Problems Location of Pain  Severity and Description of Pain Patient Has Paino Yes Site Locations Pain Location: Generalized Pain Duration of the Pain. Constant / Intermittento Constant Rate the pain. Current Pain Level: 10 Character of Pain Describe the Pain: Sharp Pain Management and  Medication Current Pain Management: Medication: Yes Electronic Signature(s) Signed: 03/20/2018 4:09:31 PM By: Army Melia Entered By: Army Melia on 03/16/2018 09:19:14 Haacke, [place]. ([id number]) -------------------------------------------------------------------------------- Patient/Caregiver Education Details Patient Name: Berrey, Lachina P. Date of Service: 03/16/2018 9:15 AM Medical Record Number: [id number] Patient Account Number: [id number] Date of Birth/Gender: 09/20/1940 (78 y.o. F) Treating RN: Harold Barban Primary Care Physician: Johny Drilling Other Clinician: Referring Physician: Johny Drilling Treating Physician/Extender: Sharalyn Ink in Treatment: 1 Education Assessment Education Provided To: Patient Education Topics Provided Wound/Skin Impairment: Handouts: Caring for Your Ulcer Methods: Demonstration, Explain/Verbal Responses: State content correctly Electronic Signature(s) Signed: 03/18/2018 8:37:22 AM By: Harold Barban Entered By: Harold Barban on 03/16/2018 09:43:01 Odonovan, La Dolores. ([id number]) -------------------------------------------------------------------------------- Wound Assessment Details Patient Name: Daman, Hagen P. Date of Service: 03/16/2018 9:15 AM Medical Record Number: [id number] Patient Account Number: [id number] Date of Birth/Sex: 1940/12/03 (78 y.o. F) Treating RN: Army Melia Primary Care Gerianne Simonet: Johny Drilling Other Clinician: Referring Kadee Philyaw: Johny Drilling Treating Bernese Doffing/Extender: Melburn Hake, HOYT Weeks in Treatment: 1 Wound Status Wound Number: 1 Primary Diabetic Wound/Ulcer of the Lower Extremity Etiology: Wound Location: Left Malleolus - Medial Secondary Venous Leg Ulcer Wounding Event: Gradually Appeared Etiology: Date Acquired: 12/07/2017 Wound Open Weeks Of Treatment: 1 Status: Clustered Wound: No Comorbid Cataracts, Angina, Coronary Artery Disease, History: Hypertension, Myocardial  Infarction, Peripheral Venous Disease, Type II Diabetes, Osteoarthritis, Neuropathy, Received Chemotherapy, Received Radiation Photos Photo Uploaded By: Army Melia on 03/16/2018 11:44:21 Wound Measurements Length: (cm) 0.7 Width: (cm) 0.5 Depth: (cm) 0.1 Area: (cm) 0.275 Volume: (cm) 0.027 % Reduction in Area: 56.2% % Reduction in Volume: 57.1% Epithelialization: None Tunneling: No Undermining: No Wound Description Classification: Grade 2 Foul Odo Wound Margin: Flat and Intact Slough/F Exudate Amount: Medium Exudate Type: Serous Exudate Color: amber r After Cleansing: No ibrino Yes Wound Bed Granulation Amount: None Present (0%) Exposed Structure Necrotic Amount: Small (1-33%) Fascia Exposed: No Necrotic Quality: Adherent Slough Fat Layer (Subcutaneous Tissue) Exposed: Yes Tendon Exposed: No Muscle Exposed: No Laye, Kashvi P. ([id number]) Joint Exposed: No Bone Exposed: No Periwound Skin Texture Texture Color No Abnormalities Noted: No No Abnormalities Noted: No Callus: No Atrophie Blanche: No Crepitus: No Cyanosis: No Excoriation: No Ecchymosis: No Induration: No Erythema: No Rash: No Hemosiderin Staining: No Scarring: No Mottled: No Pallor: No Moisture Rubor: No No Abnormalities Noted: No Dry / Scaly: No Temperature / Pain Maceration: No Temperature: No Abnormality Tenderness on Palpation: Yes Wound Preparation Ulcer Cleansing: Other: soap and water, Topical Anesthetic Applied: Other: lidocaine 4%, Electronic Signature(s) Signed: 03/20/2018 4:09:31 PM By: Army Melia Entered By: Army Melia on 03/16/2018 09:32:15 Griffiths, Korrine P. ([id number]) -------------------------------------------------------------------------------- Wound Assessment Details Patient Name: Zapata, Modupe P. Date of Service: 03/16/2018 9:15 AM Medical Record Number: [id number] Patient Account Number: [id number] Date of Birth/Sex: 04-07-1940 (78 y.o. F) Treating  RN: Army Melia Primary Care Aylee Littrell: Johny Drilling Other Clinician: Referring Zeph Riebel: Johny Drilling Treating Takoya Jonas/Extender: Melburn Hake, HOYT Weeks in Treatment: 1 Wound Status Wound Number: 2 Primary Diabetic Wound/Ulcer of the Lower Extremity Etiology: Wound Location: Right Lower Leg - Posterior Secondary Venous Leg Ulcer Wounding Event: Gradually Appeared Etiology: Date Acquired: 12/07/2017 Wound Open Weeks Of Treatment: 1 Status: Clustered Wound: No Comorbid Cataracts, Angina, Coronary Artery Disease, History: Hypertension, Myocardial Infarction, Peripheral  Venous Disease, Type II Diabetes, Osteoarthritis, Neuropathy, Received Chemotherapy, Received Radiation Photos Photo Uploaded By: Army Melia on 03/16/2018 11:45:07 Wound Measurements Length: (cm) 0.4 Width: (cm) 0.4 Depth: (cm) 0.1 Area: (cm) 0.126 Volume: (cm) 0.013 % Reduction in Area: 46.6% % Reduction in Volume: 45.8% Epithelialization: None Tunneling: No Undermining: No Wound Description Classification: Grade 1 Foul Odo Wound Margin: Flat and Intact Slough/F Exudate Amount: Medium Exudate Type: Serous Exudate Color: amber r After Cleansing: No ibrino No Wound Bed Granulation Amount: None Present (0%) Exposed Structure Necrotic Amount: None Present (0%) Fascia Exposed: No Fat Layer (Subcutaneous Tissue) Exposed: Yes Tendon Exposed: No Muscle Exposed: No Esquivel, Anabia P. ([id number]) Joint Exposed: No Bone Exposed: No Periwound Skin Texture Texture Color No Abnormalities Noted: No No Abnormalities Noted: No Callus: No Atrophie Blanche: No Crepitus: No Cyanosis: No Excoriation: No Ecchymosis: No Induration: No Erythema: No Rash: No Hemosiderin Staining: No Scarring: No Mottled: No Pallor: No Moisture Rubor: No No Abnormalities Noted: No Dry / Scaly: No Temperature / Pain Maceration: No Temperature: No Abnormality Tenderness on Palpation: Yes Wound Preparation Ulcer  Cleansing: Other: soap and water, Topical Anesthetic Applied: None Electronic Signature(s) Signed: 03/20/2018 4:09:31 PM By: Army Melia Entered By: Army Melia on 03/16/2018 09:33:20 Finnan, Madasyn P. ([id number]) -------------------------------------------------------------------------------- Vitals Details Patient Name: Mo, Lezli P. Date of Service: 03/16/2018 9:15 AM Medical Record Number: [id number] Patient Account Number: [id number] Date of Birth/Sex: 1940/03/18 (78 y.o. F) Treating RN: Army Melia Primary Care Telissa Palmisano: Johny Drilling Other Clinician: Referring Amneet Cendejas: Johny Drilling Treating Alroy Portela/Extender: Melburn Hake, HOYT Weeks in Treatment: 1 Vital Signs Time Taken: 09:19 Temperature (F): 97.6 Height (in): 67 Pulse (bpm): 85 Weight (lbs): 216 Respiratory Rate (breaths/min): 16 Body Mass Index (BMI): 33.8 Blood Pressure (mmHg): 152/79 Reference Range: 80 - 120 mg / dl Electronic Signature(s) Signed: 03/20/2018 4:09:31 PM By: Army Melia Entered By: Army Melia on 03/16/2018 09:21:05

## 2018-03-23 ENCOUNTER — Encounter: Payer: Medicare HMO | Admitting: Physician Assistant

## 2018-03-23 DIAGNOSIS — E11622 Type 2 diabetes mellitus with other skin ulcer: Secondary | ICD-10-CM | POA: Diagnosis not present

## 2018-03-24 NOTE — Progress Notes (Signed)
DERICKA, OSTENSON ([id number]) Visit Report for 03/23/2018 Chief Complaint Document Details Patient Name: Theresa [name], Theresa [name]. Date of Service: 03/23/2018 2:15 PM Medical Record Number: [id number] Patient Account Number: [id number] Date of Birth/Sex: 25-Jan-1941 (78 y.o. F) Treating RN: Harold Barban Primary Care Provider: Johny Drilling Other Clinician: Referring Provider: Johny Drilling Treating Provider/Extender: Melburn Hake, Darolyn Double Weeks in Treatment: 2 Information Obtained from: Patient Chief Complaint Bilateral LE ulcers Electronic Signature(s) Signed: 03/23/2018 3:28:11 PM By: Worthy Keeler PA-C Entered By: Worthy Keeler on 03/23/2018 14:11:10 Blatchford, Allesha P. ([id number]) -------------------------------------------------------------------------------- HPI Details Patient Name: [name], [name] Date of Service: 03/23/2018 2:15 PM Medical Record Number: [id number] Patient Account Number: [id number] Date of Birth/Sex: [birth year] (78 y.o. F) Treating RN: Harold Barban Primary Care Provider: Johny Drilling Other Clinician: Referring Provider: Johny Drilling Treating Provider/Extender: Melburn Hake, Whitnee Orzel Weeks in Treatment: 2 History of Present Illness HPI Description: 03/09/18 on evaluation today patient presents for initial inspection in our clinic concerning issues that she has been having with bilateral lower extremity ulcers. She has lymphedema as well as chronic venous stasis. She has seen Dr. dew where she's undergone ABI testing with a left ABI 1.28 a right ABI 1.22. Subsequently she is supposed to be undergoing venous studies as well which are upcoming. She does have a history of diabetes, chronic venous stasis, peripheral vascular disease, and hypertension. Currently these wounds which are currently draining are stated to have been open for roughly 2 weeks or so. It does not appear to be any evidence of infection. No fevers, chills, nausea, or vomiting noted at this time. With  that being said the patient does have some prominence to the ankle medially especially on the left side and I'm not sure if something was rubbing which calls this issue either. She is unable to get around and move very well simply due to the fact that she has an issue with her right hip as well which unfortunately prevents her from being able to move around very much. She did have an [place] placed at the request of her daughter when she was at vascular this seems to may be potentially helped a little bit. 03/16/18 on evaluation today patient's right lower extremity appears to likely be completely healed although there still small area of concern which I'm gonna continue to watch. The really does not appear to be any weeping at this point which is good news. Overall I feel like she is done excellent. In regard to left lower extremity this is a little larger still than the right although again the swelling is indeed down. The wound over the media malleolus does appear to be doing better which is good news. No fevers, chills, nausea, or vomiting noted at this time. Unfortunately the patient still continues to have right hip pain which is getting more severe. Again her surgeon did not want to undergo surgery until everything was healed as far as the wounds of the right lower extremity. 03/23/18 on evaluation today patient actually appears to be doing excellent in regard to her right lower extremity. There are no open wounds at this point in time and there is no weeping of drainage the compression wraps appear to have done very well for her. She did get the FarrowWrap 4000 Velcro compression wraps which we are gonna initiate treatment with today and this should help continue to keep swelling under control as well is the weeping. Again everything appears to be doing great in regard to the right lower  extremity. In regard to left lower extremity everything is pretty much close there are no  significant open wounds the only actual open wound that she has is on the medial left ankle which again is the opposite side from where her hip issue is and this is very superficial and shows no signs of infection at this point. Electronic Signature(s) Signed: 03/23/2018 3:28:11 PM By: Worthy Keeler PA-C Entered By: Worthy Keeler on 03/23/2018 15:23:14 Hensarling, Giordana Mamie Nick ([id number]) -------------------------------------------------------------------------------- Physical Exam Details Patient Name: [name], [name] Date of Service: 03/23/2018 2:15 PM Medical Record Number: [id number] Patient Account Number: [id number] Date of Birth/Sex: 1940/10/01 (78 y.o. F) Treating RN: Harold Barban Primary Care Provider: Johny Drilling Other Clinician: Referring Provider: Johny Drilling Treating Provider/Extender: Melburn Hake, Shironda Kain Weeks in Treatment: 2 Constitutional patient is hypertensive.. respirations regular, non-labored and within target range for patient.Marland Kitchen temperature within target range for patient.. Well-nourished and well-hydrated in no acute distress. Respiratory normal breathing without difficulty. clear to auscultation bilaterally. Cardiovascular regular rate and rhythm with normal S1, S2. 2+ dorsalis pedis/posterior tibialis pulses. trace pitting edema of the bilateral lower extremities. Psychiatric this patient is able to make decisions and demonstrates good insight into disease process. Alert and Oriented x 3. pleasant and cooperative. Notes Patient's wound currently which is on the left medial ankle is the only wound still open appears to be doing excellent at this time. I do not see any evidence whatsoever of infection and I think this area poses an extremely low if any risk whatsoever as far as surgery on the right hip. With that being said I'm actually very pleased with the overall progress that she has made and again there's no weeping or venous ulcers that appear to be open  at this point is the one area over the left medial malleolus the right lower extremity is completely ulcer free. Electronic Signature(s) Signed: 03/23/2018 3:28:11 PM By: Worthy Keeler PA-C Entered By: Worthy Keeler on 03/23/2018 15:24:13 Peer, Fredna Dow ([id number]) -------------------------------------------------------------------------------- Physician Orders Details Patient Name: Wollam, Vaniya P. Date of Service: 03/23/2018 2:15 PM Medical Record Number: [id number] Patient Account Number: [id number] Date of Birth/Sex: 1940/12/21 (78 y.o. F) Treating RN: Harold Barban Primary Care Provider: Johny Drilling Other Clinician: Referring Provider: Johny Drilling Treating Provider/Extender: Melburn Hake, Cary Lothrop Weeks in Treatment: 2 Verbal / Phone Orders: No Diagnosis Coding ICD-10 Coding Code Description E11.622 Type 2 diabetes mellitus with other skin ulcer I87.2 Venous insufficiency (chronic) (peripheral) L97.322 Non-pressure chronic ulcer of left ankle with fat layer exposed L97.812 Non-pressure chronic ulcer of other part of right lower leg with fat layer exposed I73.89 Other specified peripheral vascular diseases I10 Essential (primary) hypertension Wound Cleansing Wound #1 Left,Medial Malleolus o Clean wound with Normal Saline. o Cleanse wound with mild soap and water o May Shower, gently pat wound dry prior to applying new dressing. Anesthetic (add to Medication List) Wound #1 Left,Medial Malleolus o Topical Lidocaine 4% cream applied to wound bed prior to debridement (In Clinic Only). Primary Wound Dressing Wound #1 Left,Medial Malleolus o Silver Alginate Secondary Dressing Wound #1 Left,Medial Malleolus o Boardered Foam Dressing Dressing Change Frequency Wound #1 Left,Medial Malleolus o Three times weekly Follow-up Appointments Wound #1 Left,Medial Malleolus o Return Appointment in 1 month Edema Control Wound #1 Left,Medial Malleolus o Other: -  Farrow 4000 compression wraps Put on in morning, remove at night before bed. Apply lotion to legs at night. Additional Orders / Instructions Gesner, Tishanna P. ([id number]) Wound #1 Left,Medial Malleolus o  Increase protein intake. Electronic Signature(s) Signed: 03/23/2018 3:28:11 PM By: Worthy Keeler PA-C Signed: 03/24/2018 11:13:25 AM By: Harold Barban Entered By: Harold Barban on 03/23/2018 15:11:16 Prusinski, Isaura P. ([id number]) -------------------------------------------------------------------------------- Problem List Details Patient Name: Golda, Deniya P. Date of Service: 03/23/2018 2:15 PM Medical Record Number: [id number] Patient Account Number: [id number] Date of Birth/Sex: 1940/03/26 (78 y.o. F) Treating RN: Harold Barban Primary Care Provider: Johny Drilling Other Clinician: Referring Provider: Johny Drilling Treating Provider/Extender: Melburn Hake, Koral Thaden Weeks in Treatment: 2 Active Problems ICD-10 Evaluated Encounter Code Description Active Date Today Diagnosis E11.622 Type 2 diabetes mellitus with other skin ulcer 03/09/2018 No Yes I87.2 Venous insufficiency (chronic) (peripheral) 03/09/2018 No Yes L97.322 Non-pressure chronic ulcer of left ankle with fat layer 03/09/2018 No Yes exposed L97.812 Non-pressure chronic ulcer of other part of right lower leg 03/09/2018 No Yes with fat layer exposed I73.89 Other specified peripheral vascular diseases 03/09/2018 No Yes I10 Essential (primary) hypertension 03/09/2018 No Yes Inactive Problems Resolved Problems Electronic Signature(s) Signed: 03/23/2018 3:28:11 PM By: Worthy Keeler PA-C Entered By: Worthy Keeler on 03/23/2018 14:11:06 Sweatt, Cheray P. ([id number]) -------------------------------------------------------------------------------- Progress Note Details Patient Name: Shuttleworth, Dannie P. Date of Service: 03/23/2018 2:15 PM Medical Record Number: [id number] Patient Account Number: [id number] Date of Birth/Sex:  09-Jun-1940 (78 y.o. F) Treating RN: Harold Barban Primary Care Provider: Johny Drilling Other Clinician: Referring Provider: Johny Drilling Treating Provider/Extender: Melburn Hake, Victorya Hillman Weeks in Treatment: 2 Subjective Chief Complaint Information obtained from Patient Bilateral LE ulcers History of Present Illness (HPI) 03/09/18 on evaluation today patient presents for initial inspection in our clinic concerning issues that she has been having with bilateral lower extremity ulcers. She has lymphedema as well as chronic venous stasis. She has seen Dr. dew where she's undergone ABI testing with a left ABI 1.28 a right ABI 1.22. Subsequently she is supposed to be undergoing venous studies as well which are upcoming. She does have a history of diabetes, chronic venous stasis, peripheral vascular disease, and hypertension. Currently these wounds which are currently draining are stated to have been open for roughly 2 weeks or so. It does not appear to be any evidence of infection. No fevers, chills, nausea, or vomiting noted at this time. With that being said the patient does have some prominence to the ankle medially especially on the left side and I'm not sure if something was rubbing which calls this issue either. She is unable to get around and move very well simply due to the fact that she has an issue with her right hip as well which unfortunately prevents her from being able to move around very much. She did have an [place] placed at the request of her daughter when she was at vascular this seems to may be potentially helped a little bit. 03/16/18 on evaluation today patient's right lower extremity appears to likely be completely healed although there still small area of concern which I'm gonna continue to watch. The really does not appear to be any weeping at this point which is good news. Overall I feel like she is done excellent. In regard to left lower extremity this is a little larger still  than the right although again the swelling is indeed down. The wound over the media malleolus does appear to be doing better which is good news. No fevers, chills, nausea, or vomiting noted at this time. Unfortunately the patient still continues to have right hip pain which is getting more severe. Again her surgeon did not  want to undergo surgery until everything was healed as far as the wounds of the right lower extremity. 03/23/18 on evaluation today patient actually appears to be doing excellent in regard to her right lower extremity. There are no open wounds at this point in time and there is no weeping of drainage the compression wraps appear to have done very well for her. She did get the FarrowWrap 4000 Velcro compression wraps which we are gonna initiate treatment with today and this should help continue to keep swelling under control as well is the weeping. Again everything appears to be doing great in regard to the right lower extremity. In regard to left lower extremity everything is pretty much close there are no significant open wounds the only actual open wound that she has is on the medial left ankle which again is the opposite side from where her hip issue is and this is very superficial and shows no signs of infection at this point. Patient History Information obtained from Patient. Social History Never smoker, Marital Status - Married, Alcohol Use - Never, Drug Use - No History, Caffeine Use - Never. Medical History Eyes Patient has history of Cataracts - removed Denies history of Glaucoma, Optic Neuritis Ear/Nose/Mouth/Throat Denies history of Chronic sinus problems/congestion, Middle ear problems Hematologic/Lymphatic TIPHANI, MELLS. ([id number]) Denies history of Anemia, Hemophilia, Human Immunodeficiency Virus, Lymphedema, Sickle Cell Disease Respiratory Denies history of Aspiration, Asthma, Chronic Obstructive Pulmonary Disease (COPD), Pneumothorax, Sleep  Apnea, Tuberculosis Cardiovascular Patient has history of Angina, Coronary Artery Disease, Hypertension, Myocardial Infarction - more than 5 years ago, Peripheral Venous Disease Denies history of Arrhythmia, Congestive Heart Failure, Deep Vein Thrombosis, Hypotension, Peripheral Arterial Disease, Phlebitis, Vasculitis Gastrointestinal Denies history of Cirrhosis , Colitis, Crohn s, Hepatitis A, Hepatitis B, Hepatitis C Endocrine Patient has history of Type II Diabetes Denies history of Type I Diabetes Genitourinary Denies history of End Stage Renal Disease Immunological Denies history of Lupus Erythematosus, Raynaud s, Scleroderma Integumentary (Skin) Denies history of History of Burn, History of pressure wounds Musculoskeletal Patient has history of Osteoarthritis Denies history of Gout, Rheumatoid Arthritis, Osteomyelitis Neurologic Patient has history of Neuropathy Denies history of Dementia, Quadriplegia, Paraplegia, Seizure Disorder Oncologic Patient has history of Received Chemotherapy, Received Radiation Psychiatric Denies history of Anorexia/bulimia, Confinement Anxiety Medical And Surgical History Notes Genitourinary CKD stage 3 Oncologic 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 Review of Systems (ROS) Constitutional Symptoms (General Health) Denies complaints or symptoms of Fever, Chills, Marked Weight Change. Respiratory The patient has no complaints or symptoms. Cardiovascular Complains or has symptoms of LE edema. Psychiatric The patient has no complaints or symptoms. Objective Constitutional patient is hypertensive.. respirations regular, non-labored and within target range for patient.Marland Kitchen temperature within target range Zertuche, Cady P. ([id number]) for patient.. Well-nourished and well-hydrated in no acute distress. Vitals Time Taken: 2:29 PM, Height: 67 in, Weight: 216 lbs, BMI: 33.8, Temperature: 97.5 F, Pulse: 85 bpm,  Respiratory Rate: 16 breaths/min, Blood Pressure: 164/82 mmHg. Respiratory normal breathing without difficulty. clear to auscultation bilaterally. Cardiovascular regular rate and rhythm with normal S1, S2. 2+ dorsalis pedis/posterior tibialis pulses. trace pitting edema of the bilateral lower extremities. Psychiatric this patient is able to make decisions and demonstrates good insight into disease process. Alert and Oriented x 3. pleasant and cooperative. General Notes: Patient's wound currently which is on the left medial ankle is the only wound still open appears to be doing excellent at this time. I do not see any evidence whatsoever of  infection and I think this area poses an extremely low if any risk whatsoever as far as surgery on the right hip. With that being said I'm actually very pleased with the overall progress that she has made and again there's no weeping or venous ulcers that appear to be open at this point is the one area over the left medial malleolus the right lower extremity is completely ulcer free. Integumentary (Hair, Skin) Wound #1 status is Open. Original cause of wound was Gradually Appeared. The wound is located on the Left,Medial Malleolus. The wound measures 0.6cm length x 0.5cm width x 0.1cm depth; 0.236cm^2 area and 0.024cm^3 volume. There is Fat Layer (Subcutaneous Tissue) Exposed exposed. There is no tunneling or undermining noted. There is a small amount of serous drainage noted. The wound margin is flat and intact. There is no granulation within the wound bed. There is a small (1-33%) amount of necrotic tissue within the wound bed including Adherent Slough. The periwound skin appearance did not exhibit: Callus, Crepitus, Excoriation, Induration, Rash, Scarring, Dry/Scaly, Maceration, Atrophie Blanche, Cyanosis, Ecchymosis, Hemosiderin Staining, Mottled, Pallor, Rubor, Erythema. Periwound temperature was noted as No Abnormality. Wound #2 status is Open.  Original cause of wound was Gradually Appeared. The wound is located on the Right,Posterior Lower Leg. The wound measures 0cm length x 0cm width x 0cm depth; 0cm^2 area and 0cm^3 volume. There is Fat Layer (Subcutaneous Tissue) Exposed exposed. There is no tunneling or undermining noted. There is a none present amount of drainage noted. The wound margin is flat and intact. There is no granulation within the wound bed. There is no necrotic tissue within the wound bed. The periwound skin appearance did not exhibit: Callus, Crepitus, Excoriation, Induration, Rash, Scarring, Dry/Scaly, Maceration, Atrophie Blanche, Cyanosis, Ecchymosis, Hemosiderin Staining, Mottled, Pallor, Rubor, Erythema. Periwound temperature was noted as No Abnormality. 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 ([id number]) Plan Wound Cleansing: Wound #1 Left,Medial Malleolus: Clean wound with Normal Saline. Cleanse wound with mild soap and water May Shower, gently pat wound dry prior to applying new dressing. Anesthetic (add to Medication List): Wound #1 Left,Medial Malleolus: Topical Lidocaine 4% cream applied to wound bed prior to debridement (In Clinic Only). Primary Wound Dressing: Wound #1 Left,Medial Malleolus: Silver Alginate Secondary Dressing: Wound #1 Left,Medial Malleolus: Boardered Foam Dressing Dressing Change Frequency: Wound #1 Left,Medial Malleolus: Three times weekly Follow-up Appointments: Wound #1 Left,Medial Malleolus: Return Appointment in 1 month Edema Control: Wound #1 Left,Medial Malleolus: Other: - Farrow 4000 compression wraps Put on in morning, remove at night  before bed. Apply lotion to legs at night. Additional Orders / Instructions: Wound #1 Left,Medial Malleolus: Increase protein intake. At this point I discussed with the patient that I think that we can just set her up for a one month follow-up visit is very difficult for her getting in and getting into the chair simply due to the fact that she's having such significant pain with her right hip. Again we been trying to get her wounds healed and under control in order to get her into her surgeon for the hip replacement which is becoming more and more a necessity every day simply due to the fact this is having difficulty even getting around and performing activities of daily  living at this point. I'm gonna see her back for a fault visit in one month if she still has an open wound left medial malleolus although I think this will likely be completely healed. There is no signs of infection and this is a very superficial wound which I think poses no significant risk of infection as far as surgery is concerned. This is the opposite leg from where her hip replacement is being considered. In regard to the right leg where she had significant weeping and signs of potential cellulitis this is completely resolved with compression appears to be doing excellent at this point I see no concerns at all currently in this regard. Overall very pleased with how well this has done. I am gonna print a copy of this note to send with the patient to give to her surgeon as well outlining my findings today which I think are excellent and hopefully this will be enough as far as the healing and where were at is concerned to be able to proceed with her hip surgery. If there any other concerns or questions from a surgical standpoint for me the office can definitely contact my office with additional information questions. Otherwise at this time it is very likely that in the next month the patient will be completely healed in regard  to the superficial left medial malleolus ulcer as well which again I feel poses no risk to surgery in my opinion. The patient was provided with information for ordering the Velcro compression wraps which he has at this point and again I think if she wears these regularly that will keep the weeping and drainage under control in regard to both lower extremities. Please see above for specific wound care orders. We will see patient for re-evaluation in 1 month(s) here in the clinic. If anything worsens or changes patient will contact our office for additional recommendations. ADAHLIA, STEMBRIDGE ([id number]) Electronic Signature(s) Signed: 03/23/2018 3:28:11 PM By: Worthy Keeler PA-C Entered By: Worthy Keeler on 03/23/2018 15:27:36 Skare, Fredna Dow ([id number]) -------------------------------------------------------------------------------- ROS/PFSH Details Patient Name: Leadbetter, Icesis P. Date of Service: 03/23/2018 2:15 PM Medical Record Number: [id number] Patient Account Number: [id number] Date of Birth/Sex: 1940/06/17 (78 y.o. F) Treating RN: Harold Barban Primary Care Provider: Johny Drilling Other Clinician: Referring Provider: Johny Drilling Treating Provider/Extender: Melburn Hake, Jeffie Widdowson Weeks in Treatment: 2 Information Obtained From Patient Wound History Do you currently have one or more open woundso Yes How many open wounds do you currently haveo 3 Approximately how long have you had your woundso months How have you been treating your wound(s) until nowo unna boots Has your wound(s) ever healed and then re-openedo No Have you had any lab work done in the past montho No Have you tested positive for an antibiotic resistant organism (MRSA, VRE)o No Have you tested positive for osteomyelitis (bone infection)o No Have you had any tests for circulation on your legso No Have you had other problems associated with your woundso Swelling Constitutional Symptoms (General Health) Complaints  and Symptoms: Negative for: Fever; Chills; Marked Weight Change Cardiovascular Complaints and Symptoms: Positive for: LE edema Medical History: Positive for: Angina; Coronary Artery Disease; Hypertension; Myocardial Infarction - more than 5 years ago; Peripheral Venous Disease Negative for: Arrhythmia; Congestive Heart Failure; Deep Vein Thrombosis; Hypotension; Peripheral Arterial Disease; Phlebitis; Vasculitis Eyes Medical History: Positive for: Cataracts - removed Negative for: Glaucoma; Optic Neuritis Ear/Nose/Mouth/Throat Medical History: Negative for: Chronic sinus problems/congestion; Middle ear problems Hematologic/Lymphatic Medical History: Negative for: Anemia;  Hemophilia; Human Immunodeficiency Virus; Lymphedema; Sickle Cell Disease Respiratory Southall, Kaniesha P. ([id number]) Complaints and Symptoms: No Complaints or Symptoms Medical History: Negative for: Aspiration; Asthma; Chronic Obstructive Pulmonary Disease (COPD); Pneumothorax; Sleep Apnea; Tuberculosis Gastrointestinal Medical History: Negative for: Cirrhosis ; Colitis; Crohnos; Hepatitis A; Hepatitis B; Hepatitis C Endocrine Medical History: Positive for: Type II Diabetes Negative for: Type I Diabetes Genitourinary Medical History: Negative for: End Stage Renal Disease Past Medical History Notes: CKD stage 3 Immunological Medical History: Negative for: Lupus Erythematosus; Raynaudos; Scleroderma Integumentary (Skin) Medical History: Negative for: History of Burn; History of pressure wounds Musculoskeletal Medical History: Positive for: Osteoarthritis Negative for: Gout; Rheumatoid Arthritis; Osteomyelitis Neurologic Medical History: Positive for: Neuropathy Negative for: Dementia; Quadriplegia; Paraplegia; Seizure Disorder Oncologic Medical History: Positive for: Received Chemotherapy; Received Radiation Past Medical History Notes: 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 ([id number]) Medical History: Negative for: Anorexia/bulimia; Confinement Anxiety HBO Extended History Items Eyes: Cataracts Immunizations Pneumococcal Vaccine: Received Pneumococcal Vaccination: Yes Implantable Devices No devices added Family and Social History Never smoker; Marital Status - Married; Alcohol Use: Never; Drug Use: No History; Caffeine Use: Never; Financial Concerns: No; Food, Clothing or Shelter Needs: No; Support System Lacking: No; Transportation Concerns: No; Advanced Directives: No; Patient does not want information on Advanced Directives Physician Affirmation I have reviewed and agree with the above information. Electronic Signature(s) Signed: 03/23/2018 3:28:11 PM By: Worthy Keeler PA-C Signed: 03/24/2018 11:13:25 AM By: Harold Barban Entered By: Worthy Keeler on 03/23/2018 15:23:33 Gear, Layanna P. ([id number]) -------------------------------------------------------------------------------- SuperBill Details Patient Name: Winquist, Bennye P. Date of Service: 03/23/2018 Medical Record Number: [id number] Patient Account Number: [id number] Date of Birth/Sex: July 22, 1940 (78 y.o. 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 I73.89 Other specified peripheral vascular diseases I10 Essential (primary) hypertension Facility Procedures CPT4 Code: [id number] Description: 68616 - WOUND CARE VISIT-LEV 2 EST  PT Modifier: Quantity: 1 Physician Procedures CPT4 Code Description: [id number] 11155 - WC PHYS LEVEL 4 - EST PT ICD-10 Diagnosis Description E11.622 Type 2 diabetes mellitus with other skin ulcer L97.322 Non-pressure chronic ulcer of left ankle with fat layer expose I87.2 Venous insufficiency  (chronic) (peripheral) L97.812 Non-pressure chronic ulcer of other part of right lower leg wi Modifier: d th fat layer expo Quantity: 1 sed Electronic Signature(s) Signed: 03/23/2018 3:28:11 PM By: Worthy Keeler PA-C Entered By: Worthy Keeler on 03/23/2018 15:27:47

## 2018-03-26 NOTE — Progress Notes (Signed)
ANGELES, ZEHNER ([id number]) Visit Report for 03/23/2018 Arrival Information Details Patient Name: NELIAH, CUYLER. Date of Service: 03/23/2018 2:15 PM Medical Record Number: [id number] Patient Account Number: [id number] Date of Birth/Sex: 1940-08-07 (78 y.o. F) Treating RN: Secundino Ginger Primary Care Tad Fancher: Johny Drilling Other Clinician: Referring Keith Cancio: Johny Drilling Treating Oreoluwa Gilmer/Extender: Melburn Hake, HOYT Weeks in Treatment: 2 Visit Information History Since Last Visit Added or deleted any medications: No Patient Arrived: Wheel Chair Any new allergies or adverse reactions: No Arrival Time: 14:27 Had a fall or experienced change in No Accompanied By: daughter activities of daily living that may affect Transfer Assistance: EasyPivot Patient Lift risk of falls: Patient Identification Verified: Yes Signs or symptoms of abuse/neglect since last visito No Secondary Verification Process Yes Hospitalized since last visit: No Completed: Implantable device outside of the clinic excluding No Patient Has Alerts: Yes cellular tissue based products placed in the center Patient Alerts: Patient on Blood since last visit: Thinner Has Dressing in Place as Prescribed: Yes Plavix, 81mg  aspirin Pain Present Now: No Type II Diabetic 03/06/2018 ABI AVVS (L) 1.28 (R) 1.22 Electronic Signature(s) Signed: 03/23/2018 4:13:49 PM By: Secundino Ginger Entered By: Secundino Ginger on 03/23/2018 14:28:38 Burrous, Leimomi Mamie Nick ([id number]) -------------------------------------------------------------------------------- Clinic Level of Care Assessment Details Patient Name: Rollins, Anaiyah P. Date of Service: 03/23/2018 2:15 PM Medical Record Number: [id number] Patient Account Number: [id number] Date of Birth/Sex: 17-Feb-1940 (78 y.o. F) Treating RN: Harold Barban Primary Care Gayathri Futrell: Johny Drilling Other Clinician: Referring Creighton Longley: Johny Drilling Treating Mischele Detter/Extender: Melburn Hake, HOYT Weeks in  Treatment: 2 Clinic Level of Care Assessment Items TOOL 4 Quantity Score []  - Use when only an EandM is performed on FOLLOW-UP visit 0 ASSESSMENTS - Nursing Assessment / Reassessment X - Reassessment of Co-morbidities (includes updates in patient status) 1 10 X- 1 5 Reassessment of Adherence to Treatment Plan ASSESSMENTS - Wound and Skin Assessment / Reassessment X - Simple Wound Assessment / Reassessment - one wound 1 5 []  - 0 Complex Wound Assessment / Reassessment - multiple wounds []  - 0 Dermatologic / Skin Assessment (not related to wound area) ASSESSMENTS - Focused Assessment []  - Circumferential Edema Measurements - multi extremities 0 []  - 0 Nutritional Assessment / Counseling / Intervention []  - 0 Lower Extremity Assessment (monofilament, tuning fork, pulses) []  - 0 Peripheral Arterial Disease Assessment (using hand held doppler) ASSESSMENTS - Ostomy and/or Continence Assessment and Care []  - Incontinence Assessment and Management 0 []  - 0 Ostomy Care Assessment and Management (repouching, etc.) PROCESS - Coordination of Care X - Simple Patient / Family Education for ongoing care 1 15 []  - 0 Complex (extensive) Patient / Family Education for ongoing care []  - 0 Staff obtains Programmer, systems, Records, Test Results / Process Orders []  - 0 Staff telephones HHA, Nursing Homes / Clarify orders / etc []  - 0 Routine Transfer to another Facility (non-emergent condition) []  - 0 Routine Hospital Admission (non-emergent condition) []  - 0 New Admissions / Biomedical engineer / Ordering NPWT, Apligraf, etc. []  - 0 Emergency Hospital Admission (emergent condition) X- 1 10 Simple Discharge Coordination Umbarger, Monnica P. ([id number]) []  - 0 Complex (extensive) Discharge Coordination PROCESS - Special Needs []  - Pediatric / Minor Patient Management 0 []  - 0 Isolation Patient Management []  - 0 Hearing / Language / Visual special needs []  - 0 Assessment of Community  assistance (transportation, D/C planning, etc.) []  - 0 Additional assistance / Altered mentation []  - 0 Support Surface(s) Assessment (bed, cushion, seat, etc.) INTERVENTIONS - Wound Cleansing /  Measurement X - Simple Wound Cleansing - one wound 1 5 []  - 0 Complex Wound Cleansing - multiple wounds X- 1 5 Wound Imaging (photographs - any number of wounds) []  - 0 Wound Tracing (instead of photographs) X- 1 5 Simple Wound Measurement - one wound []  - 0 Complex Wound Measurement - multiple wounds INTERVENTIONS - Wound Dressings X - Small Wound Dressing one or multiple wounds 1 10 []  - 0 Medium Wound Dressing one or multiple wounds []  - 0 Large Wound Dressing one or multiple wounds []  - 0 Application of Medications - topical []  - 0 Application of Medications - injection INTERVENTIONS - Miscellaneous []  - External ear exam 0 []  - 0 Specimen Collection (cultures, biopsies, blood, body fluids, etc.) []  - 0 Specimen(s) / Culture(s) sent or taken to Lab for analysis []  - 0 Patient Transfer (multiple staff / Civil Service fast streamer / Similar devices) []  - 0 Simple Staple / Suture removal (25 or less) []  - 0 Complex Staple / Suture removal (26 or more) []  - 0 Hypo / Hyperglycemic Management (close monitor of Blood Glucose) []  - 0 Ankle / Brachial Index (ABI) - do not check if billed separately X- 1 5 Vital Signs Mancini, Jeneal P. ([id number]) Has the patient been seen at the hospital within the last three years: Yes Total Score: 75 Level Of Care: New/Established - Level 2 Electronic Signature(s) Signed: 03/24/2018 11:13:25 AM By: Harold Barban Entered By: Harold Barban on 03/23/2018 15:12:38 Ghazarian, Vanity P. ([id number]) -------------------------------------------------------------------------------- Encounter Discharge Information Details Patient Name: Febres, Shirel P. Date of Service: 03/23/2018 2:15 PM Medical Record Number: [id number] Patient Account Number: [id number] Date  of Birth/Sex: 1940-04-27 (78 y.o. F) Treating RN: Army Melia Primary Care Imberly Troxler: Johny Drilling Other Clinician: Referring Micco Bourbeau: Johny Drilling Treating Aleen Marston/Extender: Melburn Hake, HOYT Weeks in Treatment: 2 Encounter Discharge Information Items Discharge Condition: Stable Ambulatory Status: Wheelchair Discharge Destination: Home Transportation: Private Auto Accompanied By: daughter Schedule Follow-up Appointment: Yes Clinical Summary of Care: Electronic Signature(s) Signed: 03/25/2018 9:17:40 AM By: Army Melia Entered By: Army Melia on 03/23/2018 15:31:10 Appenzeller, Brizeida P. ([id number]) -------------------------------------------------------------------------------- Lower Extremity Assessment Details Patient Name: Helms, Ambika P. Date of Service: 03/23/2018 2:15 PM Medical Record Number: [id number] Patient Account Number: [id number] Date of Birth/Sex: [birth year] (78 y.o. F) Treating RN: Secundino Ginger Primary Care Adriyana Greenbaum: Johny Drilling Other Clinician: Referring Araceli Coufal: Johny Drilling Treating Primitivo Merkey/Extender: Melburn Hake, HOYT Weeks in Treatment: 2 Edema Assessment Assessed: [Left: No] [Right: No] [Left: Edema] [Right: :] Calf Left: Right: Point of Measurement: 25 cm From Medial Instep 35 cm 30 cm Ankle Left: Right: Point of Measurement: 13 cm From Medial Instep 23 cm 23 cm Vascular Assessment Claudication: Claudication Assessment [Left:None] [Right:None] Pulses: Dorsalis Pedis Palpable: [Left:Yes] [Right:Yes] Posterior Tibial Extremity colors, hair growth, and conditions: Extremity Color: [Left:Normal] [Right:Normal] Hair Growth on Extremity: [Left:No] [Right:No] Temperature of Extremity: [Left:Warm] [Right:Warm] Capillary Refill: [Left:< 3 seconds] [Right:> 3 seconds] Toe Nail Assessment Left: Right: Thick: No No Discolored: No No Deformed: No No Improper Length and Hygiene: No No Electronic Signature(s) Signed: 03/23/2018 4:13:49 PM By: Secundino Ginger Entered By: Secundino Ginger on 03/23/2018 14:55:23 Gaspard, Karmel P. ([id number]) -------------------------------------------------------------------------------- Multi Wound Chart Details Patient Name: Mejorado, Nathalia P. Date of Service: 03/23/2018 2:15 PM Medical Record Number: [id number] Patient Account Number: [id number] Date of Birth/Sex: 10/02/1940 (78 y.o. 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 Extremity Secondary Etiology: Venous Leg Ulcer Venous Leg Ulcer N/A Comorbid History: Cataracts, Angina, Coronary Cataracts, Angina, Coronary N/A Artery Disease, Hypertension, Artery Disease, Hypertension, Myocardial Infarction, Myocardial Infarction, Peripheral Venous Disease, Peripheral Venous Disease, Type II Diabetes, Type II Diabetes, Osteoarthritis, Neuropathy, Osteoarthritis, Neuropathy, Received Chemotherapy, Received Chemotherapy, Received Radiation Received Radiation Date Acquired: 12/07/2017 12/07/2017 N/A Weeks of Treatment: 2 2 N/A Wound Status: Open Open N/A Measurements L x W x D 0.6x0.5x0.1 0.1x0.1x0.1 N/A (cm) Area (cm) : 0.236 0.008 N/A Volume (cm) : 0.024 0.001 N/A % Reduction in Area: 62.40% 96.60% N/A % Reduction in Volume: 61.90% 95.80% N/A Classification: Grade 2 Grade 1 N/A Exudate Amount: Small None Present N/A Exudate Type: Serous N/A N/A Exudate Color: amber N/A N/A Wound Margin: Flat  and Intact Flat and Intact N/A Granulation Amount: None Present (0%) None Present (0%) N/A Necrotic Amount: Small (1-33%) None Present (0%) N/A Scheib, Jennavie P. ([id number]) Exposed Structures: Fat Layer (Subcutaneous Fat Layer (Subcutaneous N/A Tissue) Exposed: Yes Tissue) Exposed: Yes Fascia: No Fascia: No Tendon: No Tendon: No Muscle: No Muscle: No Joint: No Joint: No Bone: No Bone: No Epithelialization: None None N/A Periwound Skin Texture: Excoriation: No Excoriation: No N/A Induration: No Induration: No Callus: No Callus: No Crepitus: No Crepitus: No Rash: No Rash: No Scarring: No Scarring: No Periwound Skin Moisture: Maceration: No Maceration: No N/A Dry/Scaly: No Dry/Scaly: No Periwound Skin Color: Atrophie Blanche: No Atrophie Blanche: No N/A Cyanosis: No Cyanosis: No Ecchymosis: No Ecchymosis: No Erythema: No Erythema: No Hemosiderin Staining: No Hemosiderin Staining: No Mottled: No Mottled: No Pallor: No Pallor: No Rubor: No Rubor: No Temperature: No Abnormality No Abnormality N/A Tenderness on Palpation: No No N/A Wound Preparation: Ulcer Cleansing: Other: soap Ulcer Cleansing: Other: soap N/A and water and water Topical Anesthetic Applied: Topical Anesthetic Applied: Other: lidocaine 4% None Treatment Notes Electronic Signature(s) Signed: 03/24/2018 11:13:25 AM By: Harold Barban Entered By: Harold Barban on 03/23/2018 15:04:15 Omdahl, Aaryn P. ([id number]) -------------------------------------------------------------------------------- Multi-Disciplinary Care Plan Details Patient Name: Allard, Kaden P. Date of Service: 03/23/2018 2:15 PM Medical Record Number: [id number] Patient Account Number: [id number] Date of Birth/Sex: August 14, 1940 (78 y.o. F) Treating RN: Harold Barban Primary Care Isaura Schiller: Johny Drilling Other Clinician: Referring Custer Pimenta: Johny Drilling Treating Eliseo Withers/Extender: Melburn Hake, HOYT Weeks in Treatment:  2 Active Inactive Abuse / Safety / Falls / Self Care Management Nursing Diagnoses: Potential for falls Goals: Patient will remain injury free related to falls Date Initiated: 03/09/2018 Target Resolution Date: 03/27/2018 Goal Status: Active Interventions: Assess fall risk on admission and as needed Notes: Medication Nursing Diagnoses: Knowledge deficit related to medication safety: actual or potential Goals: Patient/caregiver will demonstrate understanding of all current medications Date Initiated: 03/09/2018 Target Resolution Date: 03/27/2018 Goal Status: Active Interventions: Assess for medication contraindications each visit where new medications are prescribed Notes: Orientation to the Wound Care Program Nursing Diagnoses: Knowledge deficit related to the wound healing center program Goals: Patient/caregiver will verbalize understanding of the [place] Program Date Initiated: 03/09/2018 Target Resolution Date: 03/27/2018 Goal Status: Active Interventions: Provide education on orientation to the wound center Soller, Kamber P. ([id number]) Notes: Venous Leg Ulcer Nursing Diagnoses: Potential for venous Insuffiency (use before diagnosis confirmed) Goals: Non-invasive venous  studies are completed as ordered Date Initiated: 03/09/2018 Target Resolution Date: 03/18/2018 Goal Status: Active Interventions: Assess peripheral edema status every visit. Treatment Activities: Non-invasive vascular studies : 03/09/2018 Venous Duplex Doppler : 03/18/2018 Notes: Wound/Skin Impairment Nursing Diagnoses: Impaired tissue integrity Goals: Patient/caregiver will verbalize understanding of skin care regimen Date Initiated: 03/09/2018 Target Resolution Date: 03/27/2018 Goal Status: Active Interventions: Assess patient/caregiver ability to obtain necessary supplies Treatment Activities: Patient referred to home care : 03/09/2018 Notes: Electronic Signature(s) Signed:  03/24/2018 11:13:25 AM By: Harold Barban Entered By: Harold Barban on 03/23/2018 15:04:04 Borelli, Anikah P. ([id number]) -------------------------------------------------------------------------------- Pain Assessment Details Patient Name: Dwyer, Tove P. Date of Service: 03/23/2018 2:15 PM Medical Record Number: [id number] Patient Account Number: [id number] Date of Birth/Sex: Dec 05, 1940 (78 y.o. F) Treating RN: Secundino Ginger Primary Care Casia Corti: Johny Drilling Other Clinician: Referring Lacy Sofia: Johny Drilling Treating Brentin Shin/Extender: Melburn Hake, HOYT Weeks in Treatment: 2 Active Problems Location of Pain Severity and Description of Pain Patient Has Paino Yes Site Locations Pain Management and Medication Current Pain Management: Notes pt C/O pain to right hip only. enc to see primary prn. Electronic Signature(s) Signed: 03/23/2018 4:13:49 PM By: Secundino Ginger Entered By: Secundino Ginger on 03/23/2018 14:29:40 Madore, Hisako Mamie Nick ([id number]) -------------------------------------------------------------------------------- Patient/Caregiver Education Details Patient Name: Parsley, Natoria P. Date of Service: 03/23/2018 2:15 PM Medical Record Number: [id number] Patient Account Number: [id number] Date of Birth/Gender: 1940/02/19 (78 y.o. F) Treating RN: Harold Barban Primary Care Physician: Johny Drilling Other Clinician: Referring Physician: Johny Drilling Treating Physician/Extender: Sharalyn Ink in Treatment: 2 Education Assessment Education Provided To: Patient Education Topics Provided Wound/Skin Impairment: Handouts: Caring for Your Ulcer Methods: Demonstration, Explain/Verbal Responses: State content correctly Electronic Signature(s) Signed: 03/24/2018 11:13:25 AM By: Harold Barban Entered By: Harold Barban on 03/23/2018 15:04:50 Bulluck, Carlei P. ([id number]) -------------------------------------------------------------------------------- Wound Assessment  Details Patient Name: Bachmann, Noely P. Date of Service: 03/23/2018 2:15 PM Medical Record Number: [id number] Patient Account Number: [id number] Date of Birth/Sex: [birth year] (78 y.o. F) Treating RN: Secundino Ginger Primary Care Dereon Corkery: Johny Drilling Other Clinician: Referring Mariacristina Aday: Johny Drilling Treating Joanthan Hlavacek/Extender: Melburn Hake, HOYT Weeks in Treatment: 2 Wound Status Wound Number: 1 Primary Diabetic Wound/Ulcer of the Lower Extremity Etiology: Wound Location: Left Malleolus - Medial Secondary Venous Leg Ulcer Wounding Event: Gradually Appeared Etiology: Date Acquired: 12/07/2017 Wound Open Weeks Of Treatment: 2 Status: Clustered Wound: No Comorbid Cataracts, Angina, Coronary Artery Disease, History: Hypertension, Myocardial Infarction, Peripheral Venous Disease, Type II Diabetes, Osteoarthritis, Neuropathy, Received Chemotherapy, Received Radiation Photos Photo Uploaded By: Secundino Ginger on 03/23/2018 15:01:49 Wound Measurements Length: (cm) 0.6 % Reduction Width: (cm) 0.5 % Reduction Depth: (cm) 0.1 Epitheliali Area: (cm) 0.236 Tunneling: Volume: (cm) 0.024 Underminin in Area: 62.4% in Volume: 61.9% zation: None No g: No Wound Description Classification: Grade 2 Foul Odor A Wound Margin: Flat and Intact Slough/Fibr Exudate Amount: Small Exudate Type: Serous Exudate Color: amber fter Cleansing: No ino Yes Wound Bed Granulation Amount: None Present (0%) Exposed Structure Necrotic Amount: Small (1-33%) Fascia Exposed: No Necrotic Quality: Adherent Slough Fat Layer (Subcutaneous Tissue) Exposed: Yes Tendon Exposed: No Muscle Exposed: No Badour, Annelie P. ([id number]) Joint Exposed: No Bone Exposed: No Periwound Skin Texture Texture Color No Abnormalities Noted: No No Abnormalities Noted: No Callus: No Atrophie Blanche: No Crepitus: No Cyanosis: No Excoriation: No Ecchymosis: No Induration: No Erythema: No Rash: No Hemosiderin Staining:  No Scarring: No Mottled: No Pallor: No Moisture Rubor: No No Abnormalities Noted: No Dry / Scaly: No Temperature / Pain Maceration: No Temperature: No Abnormality  Wound Preparation Ulcer Cleansing: Other: soap and water, Topical Anesthetic Applied: Other: lidocaine 4%, Treatment Notes Wound #1 (Left, Medial Malleolus) Notes TCA,SILVERCEL, BFD Electronic Signature(s) Signed: 03/23/2018 4:13:49 PM By: Secundino Ginger Entered By: Secundino Ginger on 03/23/2018 14:50:56 Trevizo, Maja P. ([id number]) -------------------------------------------------------------------------------- Wound Assessment Details Patient Name: Semrad, Kimika P. Date of Service: 03/23/2018 2:15 PM Medical Record Number: [id number] Patient Account Number: [id number] Date of Birth/Sex: 1940/10/19 (78 y.o. F) Treating RN: Harold Barban Primary Care Jyasia Markoff: Johny Drilling Other Clinician: Referring Julia Alkhatib: Johny Drilling Treating Harshal Sirmon/Extender: Melburn Hake, HOYT Weeks in Treatment: 2 Wound Status Wound Number: 2 Primary Diabetic Wound/Ulcer of the Lower Extremity Etiology: Wound Location: Right, Posterior Lower Leg Secondary Venous Leg Ulcer Wounding Event: Gradually Appeared Etiology: Date Acquired: 12/07/2017 Wound Open Weeks Of Treatment: 2 Status: Clustered Wound: No Comorbid Cataracts, Angina, Coronary Artery Disease, History: Hypertension, Myocardial Infarction, Peripheral Venous Disease, Type II Diabetes, Osteoarthritis, Neuropathy, Received Chemotherapy, Received Radiation Photos Photo Uploaded By: Secundino Ginger on 03/23/2018 15:01:50 Wound Measurements Length: (cm) 0 % Reduc Width: (cm) 0 % Reduc Depth: (cm) 0 Epithel Area: (cm) 0 Tunnel Volume: (cm) 0 Underm tion in Area: 100% tion in Volume: 100% ialization: None ing: No ining: No Wound Description Classification: Grade 1 Foul Od Wound Margin: Flat and Intact Slough/ Exudate Amount: None Present or After Cleansing: No Fibrino  Yes Wound Bed Granulation Amount: None Present (0%) Exposed Structure Necrotic Amount: None Present (0%) Fascia Exposed: No Fat Layer (Subcutaneous Tissue) Exposed: Yes Tendon Exposed: No Muscle Exposed: No Joint Exposed: No Bone Exposed: No Tiedt, Lizeth P. ([id number]) Periwound Skin Texture Texture Color No Abnormalities Noted: No No Abnormalities Noted: No Callus: No Atrophie Blanche: No Crepitus: No Cyanosis: No Excoriation: No Ecchymosis: No Induration: No Erythema: No Rash: No Hemosiderin Staining: No Scarring: No Mottled: No Pallor: No Moisture Rubor: No No Abnormalities Noted: No Dry / Scaly: No Temperature / Pain Maceration: No Temperature: No Abnormality Wound Preparation Ulcer Cleansing: Other: soap and water, Topical Anesthetic Applied: None Electronic Signature(s) Signed: 03/24/2018 11:13:25 AM By: Harold Barban Entered By: Harold Barban on 03/23/2018 15:06:25 Demarinis, Chevella P. ([id number]) -------------------------------------------------------------------------------- Vitals Details Patient Name: Weisse, Liberty P. Date of Service: 03/23/2018 2:15 PM Medical Record Number: [id number] Patient Account Number: [id number] Date of Birth/Sex: 1940-10-29 (78 y.o. F) Treating RN: Secundino Ginger Primary Care Carli Lefevers: Johny Drilling Other Clinician: Referring Kenyona Rena: Johny Drilling Treating Shiraz Bastyr/Extender: Melburn Hake, HOYT Weeks in Treatment: 2 Vital Signs Time Taken: 14:29 Temperature (F): 97.5 Height (in): 67 Pulse (bpm): 85 Weight (lbs): 216 Respiratory Rate (breaths/min): 16 Body Mass Index (BMI): 33.8 Blood Pressure (mmHg): 164/82 Reference Range: 80 - 120 mg / dl Electronic Signature(s) Signed: 03/23/2018 4:13:49 PM By: Secundino Ginger Entered BySecundino Ginger on 03/23/2018 14:32:54

## 2018-04-02 ENCOUNTER — Encounter: Payer: Medicare HMO | Attending: Physician Assistant | Admitting: Physician Assistant

## 2018-04-02 DIAGNOSIS — E1151 Type 2 diabetes mellitus with diabetic peripheral angiopathy without gangrene: Secondary | ICD-10-CM | POA: Insufficient documentation

## 2018-04-02 DIAGNOSIS — I130 Hypertensive heart and chronic kidney disease with heart failure and stage 1 through stage 4 chronic kidney disease, or unspecified chronic kidney disease: Secondary | ICD-10-CM | POA: Insufficient documentation

## 2018-04-02 DIAGNOSIS — M1990 Unspecified osteoarthritis, unspecified site: Secondary | ICD-10-CM | POA: Insufficient documentation

## 2018-04-02 DIAGNOSIS — I509 Heart failure, unspecified: Secondary | ICD-10-CM | POA: Diagnosis not present

## 2018-04-02 DIAGNOSIS — E11622 Type 2 diabetes mellitus with other skin ulcer: Secondary | ICD-10-CM | POA: Diagnosis present

## 2018-04-02 DIAGNOSIS — L97812 Non-pressure chronic ulcer of other part of right lower leg with fat layer exposed: Secondary | ICD-10-CM | POA: Insufficient documentation

## 2018-04-02 DIAGNOSIS — N183 Chronic kidney disease, stage 3 (moderate): Secondary | ICD-10-CM | POA: Insufficient documentation

## 2018-04-02 DIAGNOSIS — I872 Venous insufficiency (chronic) (peripheral): Secondary | ICD-10-CM | POA: Insufficient documentation

## 2018-04-02 DIAGNOSIS — L97322 Non-pressure chronic ulcer of left ankle with fat layer exposed: Secondary | ICD-10-CM | POA: Insufficient documentation

## 2018-04-02 DIAGNOSIS — I890 Lymphedema, not elsewhere classified: Secondary | ICD-10-CM | POA: Insufficient documentation

## 2018-04-02 DIAGNOSIS — I252 Old myocardial infarction: Secondary | ICD-10-CM | POA: Insufficient documentation

## 2018-04-02 DIAGNOSIS — I2510 Atherosclerotic heart disease of native coronary artery without angina pectoris: Secondary | ICD-10-CM | POA: Diagnosis not present

## 2018-04-02 DIAGNOSIS — E1140 Type 2 diabetes mellitus with diabetic neuropathy, unspecified: Secondary | ICD-10-CM | POA: Diagnosis not present

## 2018-04-05 NOTE — Progress Notes (Signed)
BERT, GIVANS ([id number]) Visit Report for 04/02/2018 Chief Complaint Document Details Patient Name: Theresa [name], Theresa [name]. Date of Service: 04/02/2018 9:00 AM Medical Record Number: [id number] Patient Account Number: [id number] Date of Birth/Sex: 06-Oct-1940 (78 y.o. F) Treating RN: Army Melia Primary Care Provider: Johny Drilling Other Clinician: Referring Provider: Johny Drilling Treating Provider/Extender: Melburn Hake, HOYT Weeks in Treatment: 3 Information Obtained from: Patient Chief Complaint Bilateral LE ulcers Electronic Signature(s) Signed: 04/05/2018 11:30:14 PM By: Worthy Keeler PA-C Entered By: Worthy Keeler on 04/02/2018 09:32:51 Egge, Kenadie P. ([id number]) -------------------------------------------------------------------------------- Debridement Details Patient Name: [name], [name] Date of Service: 04/02/2018 9:00 AM Medical Record Number: [id number] Patient Account Number: [id number] Date of Birth/Sex: [birth year] (78 y.o. F) Treating RN: Army Melia Primary Care Provider: Johny Drilling Other Clinician: Referring Provider: Johny Drilling Treating Provider/Extender: Melburn Hake, HOYT Weeks in Treatment: 3 Debridement Performed for Wound #1 Left,Medial Malleolus Assessment: Performed By: Physician STONE III, HOYT E., PA-C Debridement Type: Debridement Severity of Tissue Pre Fat layer exposed Debridement: Level of Consciousness (Pre- Awake and Alert procedure): Pre-procedure Verification/Time Yes - 09:38 Out Taken: Start Time: 09:39 Pain Control: Lidocaine Total Area Debrided (L x W): 0.6 (cm) x 0.5 (cm) = 0.3 (cm) Tissue and other material Viable, Non-Viable, Slough, Subcutaneous, Skin: Dermis , Slough debrided: Level: Skin/Subcutaneous Tissue Debridement Description: Excisional Instrument: Curette Bleeding: Minimum Hemostasis Achieved: Pressure End Time: 09:42 Response to Treatment: Procedure was tolerated well Level of Consciousness Awake and  Alert (Post-procedure): Post Debridement Measurements of Total Wound Length: (cm) 0.6 Width: (cm) 0.5 Depth: (cm) 0.1 Volume: (cm) 0.024 Character of Wound/Ulcer Post Debridement: Stable Severity of Tissue Post Debridement: Fat layer exposed Post Procedure Diagnosis Same as Pre-procedure Electronic Signature(s) Signed: 04/03/2018 3:17:07 PM By: Army Melia Signed: 04/05/2018 11:30:14 PM By: Worthy Keeler PA-C Entered By: Army Melia on 04/02/2018 09:42:52 Foor, Tyree P. ([id number]) -------------------------------------------------------------------------------- HPI Details Patient Name: [name], [name] Date of Service: 04/02/2018 9:00 AM Medical Record Number: [id number] Patient Account Number: [id number] Date of Birth/Sex: 02-24-1940 (78 y.o. F) Treating RN: Army Melia Primary Care Provider: Johny Drilling Other Clinician: Referring Provider: Johny Drilling Treating Provider/Extender: Melburn Hake, HOYT Weeks in Treatment: 3 History of Present Illness HPI Description: 03/09/18 on evaluation today patient presents for initial inspection in our clinic concerning issues that she has been having with bilateral lower extremity ulcers. She has lymphedema as well as chronic venous stasis. She has seen Dr. dew where she's undergone ABI testing with a left ABI 1.28 a right ABI 1.22. Subsequently she is supposed to be undergoing venous studies as well which are upcoming. She does have a history of diabetes, chronic venous stasis, peripheral vascular disease, and hypertension. Currently these wounds which are currently draining are stated to have been open for roughly 2 weeks or so. It does not appear to be any evidence of infection. No fevers, chills, nausea, or vomiting noted at this time. With that being said the patient does have some prominence to the ankle medially especially on the left side and I'm not sure if something was rubbing which calls this issue either. She is unable to get  around and move very well simply due to the fact that she has an issue with her right hip as well which unfortunately prevents her from being able to move around very much. She did have an [place] placed at the request of her daughter when she was at vascular this seems to may be potentially helped a little bit. 03/16/18 on  evaluation today patient's right lower extremity appears to likely be completely healed although there still small area of concern which I'm gonna continue to watch. The really does not appear to be any weeping at this point which is good news. Overall I feel like she is done excellent. In regard to left lower extremity this is a little larger still than the right although again the swelling is indeed down. The wound over the media malleolus does appear to be doing better which is good news. No fevers, chills, nausea, or vomiting noted at this time. Unfortunately the patient still continues to have right hip pain which is getting more severe. Again her surgeon did not want to undergo surgery until everything was healed as far as the wounds of the right lower extremity. 03/23/18 on evaluation today patient actually appears to be doing excellent in regard to her right lower extremity. There are no open wounds at this point in time and there is no weeping of drainage the compression wraps appear to have done very well for her. She did get the FarrowWrap 4000 Velcro compression wraps which we are gonna initiate treatment with today and this should help continue to keep swelling under control as well is the weeping. Again everything appears to be doing great in regard to the right lower extremity. In regard to left lower extremity everything is pretty much close there are no significant open wounds the only actual open wound that she has is on the medial left ankle which again is the opposite side from where her hip issue is and this is very superficial and shows no signs of infection  at this point. 04/02/18 on evaluation today patient actually presents for an early appointment after I saw her last week due to several areas the left lower extremity which had reopened causing some issues at this point. Fortunately there does not appear to be any evidence of infection at this time. With that being said she again is waiting on everything to be better in order to be able to proceed with the surgery for her right hip as previously suggested. Electronic Signature(s) Signed: 04/05/2018 11:30:14 PM By: Worthy Keeler PA-C Entered By: Worthy Keeler on 04/02/2018 09:52:13 Yarberry, Lessa Mamie Nick ([id number]) -------------------------------------------------------------------------------- Physical Exam Details Patient Name: Savard, Teresea P. Date of Service: 04/02/2018 9:00 AM Medical Record Number: [id number] Patient Account Number: [id number] Date of Birth/Sex: 04/02/1940 (78 y.o. F) Treating RN: Army Melia Primary Care Provider: Johny Drilling Other Clinician: Referring Provider: Johny Drilling Treating Provider/Extender: Melburn Hake, HOYT Weeks in Treatment: 3 Constitutional Well-nourished and well-hydrated in no acute distress. Respiratory normal breathing without difficulty. Psychiatric this patient is able to make decisions and demonstrates good insight into disease process. Alert and Oriented x 3. pleasant and cooperative. Notes Patient's wound bed currently shows evidence of fairly good granulation the left medial ankle ulcer did require some debridement when celebrated the area actually appears to be doing very well. In fact it seems smaller and what it was during the last visit by about half. Overall think she's doing excellent at this point. I think we can likely get this healed pretty quickly and in fact if there had been an issue with the compression stocking size that we ordered for her which delayed her getting the appropriate therapy she probably would not even have  these new ulcers and likely would've been even better than what she is today. Electronic Signature(s) Signed: 04/05/2018 11:30:14 PM By: Worthy Keeler PA-C Entered  By: Worthy Keeler on 04/02/2018 09:52:59 Ransome, Shadana Mamie Nick ([id number]) -------------------------------------------------------------------------------- Physician Orders Details Patient Name: Dewberry, Mkenzie P. Date of Service: 04/02/2018 9:00 AM Medical Record Number: [id number] Patient Account Number: [id number] Date of Birth/Sex: 1940/06/23 (78 y.o. F) Treating RN: Army Melia Primary Care Provider: Johny Drilling Other Clinician: Referring Provider: Johny Drilling Treating Provider/Extender: Melburn Hake, HOYT Weeks in Treatment: 3 Verbal / Phone Orders: No Diagnosis Coding ICD-10 Coding Code Description E11.622 Type 2 diabetes mellitus with other skin ulcer I87.2 Venous insufficiency (chronic) (peripheral) L97.322 Non-pressure chronic ulcer of left ankle with fat layer exposed L97.812 Non-pressure chronic ulcer of other part of right lower leg with fat layer exposed I73.89 Other specified peripheral vascular diseases I10 Essential (primary) hypertension Wound Cleansing Wound #1 Left,Medial Malleolus o Clean wound with Normal Saline. o Cleanse wound with mild soap and water o May Shower, gently pat wound dry prior to applying new dressing. Wound #3 Left,Medial Lower Leg o Clean wound with Normal Saline. o Cleanse wound with mild soap and water o May Shower, gently pat wound dry prior to applying new dressing. Wound #4 Left,Lateral Lower Leg o Clean wound with Normal Saline. o Cleanse wound with mild soap and water o May Shower, gently pat wound dry prior to applying new dressing. Anesthetic (add to Medication List) Wound #1 Left,Medial Malleolus o Topical Lidocaine 4% cream applied to wound bed prior to debridement (In Clinic Only). Wound #3 Left,Medial Lower Leg o Topical Lidocaine 4% cream  applied to wound bed prior to debridement (In Clinic Only). Wound #4 Left,Lateral Lower Leg o Topical Lidocaine 4% cream applied to wound bed prior to debridement (In Clinic Only). Primary Wound Dressing Wound #1 Left,Medial Malleolus o Silver Alginate Wound #3 Left,Medial Lower Leg o Silver Alginate Wound #4 Left,Lateral Lower Leg Vanliew, Janal P. ([id number]) o Silver Alginate Secondary Dressing Wound #1 Left,Medial Malleolus o ABD pad Wound #3 Left,Medial Lower Leg o ABD pad Wound #4 Left,Lateral Lower Leg o ABD pad Dressing Change Frequency Wound #1 Left,Medial Malleolus o Change dressing every week Wound #3 Left,Medial Lower Leg o Change dressing every week Wound #4 Left,Lateral Lower Leg o Change dressing every week Follow-up Appointments Wound #1 Left,Medial Malleolus o Return Appointment in 1 week. Wound #3 Left,Medial Lower Leg o Return Appointment in 1 week. Wound #4 Left,Lateral Lower Leg o Return Appointment in 1 week. Edema Control Wound #1 Left,Medial Malleolus o 3 Layer Compression System - Left Lower Extremity - TCA ointment o Other: - on the right leg Farrow 4000 compression wraps Put on in morning, remove at night before bed. Apply lotion to legs at night. Wound #3 Left,Medial Lower Leg o 3 Layer Compression System - Left Lower Extremity - TCA ointment o Other: - on the right leg Farrow 4000 compression wraps Put on in morning, remove at night before bed. Apply lotion to legs at night. Wound #4 Left,Lateral Lower Leg o 3 Layer Compression System - Left Lower Extremity - TCA ointment o Other: - on the right leg Farrow 4000 compression wraps Put on in morning, remove at night before bed. Apply lotion to legs at night. Additional Orders / Instructions Wound #1 Left,Medial Malleolus o Increase protein intake. Wound #3 Left,Medial Lower Leg o Increase protein intake. Tuohey, Mikhayla P. ([id number]) Wound #4  Left,Lateral Lower Leg o Increase protein intake. Electronic Signature(s) Signed: 04/03/2018 3:17:07 PM By: Army Melia Signed: 04/05/2018 11:30:14 PM By: Worthy Keeler PA-C Entered By: Army Melia on 04/02/2018 09:46:45 Freimuth, Soua P. ([id number]) --------------------------------------------------------------------------------  Problem List Details Patient Name: Scalia, Aarika P. Date of Service: 04/02/2018 9:00 AM Medical Record Number: [id number] Patient Account Number: [id number] Date of Birth/Sex: Jun 19, 1940 (78 y.o. F) Treating RN: Army Melia Primary Care Provider: Johny Drilling Other Clinician: Referring Provider: Johny Drilling Treating Provider/Extender: Melburn Hake, HOYT Weeks in Treatment: 3 Active Problems ICD-10 Evaluated Encounter Code Description Active Date Today Diagnosis E11.622 Type 2 diabetes mellitus with other skin ulcer 03/09/2018 No Yes I87.2 Venous insufficiency (chronic) (peripheral) 03/09/2018 No Yes L97.322 Non-pressure chronic ulcer of left ankle with fat layer 03/09/2018 No Yes exposed L97.812 Non-pressure chronic ulcer of other part of right lower leg 03/09/2018 No Yes with fat layer exposed I73.89 Other specified peripheral vascular diseases 03/09/2018 No Yes I10 Essential (primary) hypertension 03/09/2018 No Yes Inactive Problems Resolved Problems Electronic Signature(s) Signed: 04/05/2018 11:30:14 PM By: Worthy Keeler PA-C Entered By: Worthy Keeler on 04/02/2018 09:32:46 Graul, Zeffie P. ([id number]) -------------------------------------------------------------------------------- Progress Note Details Patient Name: [name], Yvonnie P. Date of Service: 04/02/2018 9:00 AM Medical Record Number: [id number] Patient Account Number: [id number] Date of Birth/Sex: 1940-12-23 (78 y.o. F) Treating RN: Army Melia Primary Care Provider: Johny Drilling Other Clinician: Referring Provider: Johny Drilling Treating Provider/Extender: Melburn Hake, HOYT Weeks in  Treatment: 3 Subjective Chief Complaint Information obtained from Patient Bilateral LE ulcers History of Present Illness (HPI) 03/09/18 on evaluation today patient presents for initial inspection in our clinic concerning issues that she has been having with bilateral lower extremity ulcers. She has lymphedema as well as chronic venous stasis. She has seen Dr. dew where she's undergone ABI testing with a left ABI 1.28 a right ABI 1.22. Subsequently she is supposed to be undergoing venous studies as well which are upcoming. She does have a history of diabetes, chronic venous stasis, peripheral vascular disease, and hypertension. Currently these wounds which are currently draining are stated to have been open for roughly 2 weeks or so. It does not appear to be any evidence of infection. No fevers, chills, nausea, or vomiting noted at this time. With that being said the patient does have some prominence to the ankle medially especially on the left side and I'm not sure if something was rubbing which calls this issue either. She is unable to get around and move very well simply due to the fact that she has an issue with her right hip as well which unfortunately prevents her from being able to move around very much. She did have an [place] placed at the request of her daughter when she was at vascular this seems to may be potentially helped a little bit. 03/16/18 on evaluation today patient's right lower extremity appears to likely be completely healed although there still small area of concern which I'm gonna continue to watch. The really does not appear to be any weeping at this point which is good news. Overall I feel like she is done excellent. In regard to left lower extremity this is a little larger still than the right although again the swelling is indeed down. The wound over the media malleolus does appear to be doing better which is good news. No fevers, chills, nausea, or vomiting noted at  this time. Unfortunately the patient still continues to have right hip pain which is getting more severe. Again her surgeon did not want to undergo surgery until everything was healed as far as the wounds of the right lower extremity. 03/23/18 on evaluation today patient actually appears to be doing excellent in regard to her  right lower extremity. There are no open wounds at this point in time and there is no weeping of drainage the compression wraps appear to have done very well for her. She did get the FarrowWrap 4000 Velcro compression wraps which we are gonna initiate treatment with today and this should help continue to keep swelling under control as well is the weeping. Again everything appears to be doing great in regard to the right lower extremity. In regard to left lower extremity everything is pretty much close there are no significant open wounds the only actual open wound that she has is on the medial left ankle which again is the opposite side from where her hip issue is and this is very superficial and shows no signs of infection at this point. 04/02/18 on evaluation today patient actually presents for an early appointment after I saw her last week due to several areas the left lower extremity which had reopened causing some issues at this point. Fortunately there does not appear to be any evidence of infection at this time. With that being said she again is waiting on everything to be better in order to be able to proceed with the surgery for her right hip as previously suggested. Patient History Information obtained from Patient. Social History Never smoker, Marital Status - Married, Alcohol Use - Never, Drug Use - No History, Caffeine Use - Never. Medical History Eyes JULIANI, LADUKE. ([id number]) Patient has history of Cataracts - removed Denies history of Glaucoma, Optic Neuritis Ear/Nose/Mouth/Throat Denies history of Chronic sinus problems/congestion, Middle ear  problems Hematologic/Lymphatic Denies history of Anemia, Hemophilia, Human Immunodeficiency Virus, Lymphedema, Sickle Cell Disease Respiratory Denies history of Aspiration, Asthma, Chronic Obstructive Pulmonary Disease (COPD), Pneumothorax, Sleep Apnea, Tuberculosis Cardiovascular Patient has history of Angina, Coronary Artery Disease, Hypertension, Myocardial Infarction - more than 5 years ago, Peripheral Venous Disease Denies history of Arrhythmia, Congestive Heart Failure, Deep Vein Thrombosis, Hypotension, Peripheral Arterial Disease, Phlebitis, Vasculitis Gastrointestinal Denies history of Cirrhosis , Colitis, Crohn s, Hepatitis A, Hepatitis B, Hepatitis C Endocrine Patient has history of Type II Diabetes Denies history of Type I Diabetes Genitourinary Denies history of End Stage Renal Disease Immunological Denies history of Lupus Erythematosus, Raynaud s, Scleroderma Integumentary (Skin) Denies history of History of Burn, History of pressure wounds Musculoskeletal Patient has history of Osteoarthritis Denies history of Gout, Rheumatoid Arthritis, Osteomyelitis Neurologic Patient has history of Neuropathy Denies history of Dementia, Quadriplegia, Paraplegia, Seizure Disorder Oncologic Patient has history of Received Chemotherapy, Received Radiation Psychiatric Denies history of Anorexia/bulimia, Confinement Anxiety Medical And Surgical History Notes Genitourinary CKD stage 3 Oncologic 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 Review of Systems (ROS) Constitutional Symptoms (General Health) Denies complaints or symptoms of Fever, Chills. Respiratory The patient has no complaints or symptoms. Cardiovascular Complains or has symptoms of LE edema. Psychiatric The patient has no complaints or symptoms. Victorious, Cosio Kippy P. ([id number]) Objective Constitutional Well-nourished and well-hydrated in no acute distress. Vitals Time Taken: 8:59 AM, Height: 67  in, Weight: 216 lbs, BMI: 33.8, Temperature: 97.5 F, Pulse: 72 bpm, Respiratory Rate: 18 breaths/min, Blood Pressure: 185/71 mmHg. Respiratory normal breathing without difficulty. Psychiatric this patient is able to make decisions and demonstrates good insight into disease process. Alert and Oriented x 3. pleasant and cooperative. General Notes: Patient's wound bed currently shows evidence of fairly good granulation the left medial ankle ulcer did require some debridement when celebrated the area actually appears to be doing very well. In fact it seems  smaller and what it was during the last visit by about half. Overall think she's doing excellent at this point. I think we can likely get this healed pretty quickly and in fact if there had been an issue with the compression stocking size that we ordered for her which delayed her getting the appropriate therapy she probably would not even have these new ulcers and likely would've been even better than what she is today. Integumentary (Hair, Skin) Wound #1 status is Open. Original cause of wound was Gradually Appeared. The wound is located on the Left,Medial Malleolus. The wound measures 0.6cm length x 0.5cm width x 0.1cm depth; 0.236cm^2 area and 0.024cm^3 volume. There is Fat Layer (Subcutaneous Tissue) Exposed exposed. There is no tunneling or undermining noted. There is a small amount of serous drainage noted. The wound margin is flat and intact. There is no granulation within the wound bed. There is a small (1-33%) amount of necrotic tissue within the wound bed including Adherent Slough. The periwound skin appearance did not exhibit: Callus, Crepitus, Excoriation, Induration, Rash, Scarring, Dry/Scaly, Maceration, Atrophie Blanche, Cyanosis, Ecchymosis, Hemosiderin Staining, Mottled, Pallor, Rubor, Erythema. Periwound temperature was noted as No Abnormality. Wound #3 status is Open. Original cause of wound was Gradually Appeared. The wound  is located on the Left,Medial Lower Leg. The wound measures 1cm length x 0.4cm width x 0.1cm depth; 0.314cm^2 area and 0.031cm^3 volume. There is Fat Layer (Subcutaneous Tissue) Exposed exposed. There is no tunneling or undermining noted. There is a small amount of serous drainage noted. The wound margin is flat and intact. There is small (1-33%) pink granulation within the wound bed. There is a medium (34-66%) amount of necrotic tissue within the wound bed including Adherent Slough. The periwound skin appearance exhibited: Erythema. The surrounding wound skin color is noted with erythema which is circumferential. Wound #4 status is Open. Original cause of wound was Gradually Appeared. The wound is located on the Left,Lateral Lower Leg. The wound measures 1.2cm length x 2cm width x 0.1cm depth; 1.885cm^2 area and 0.188cm^3 volume. There is Fat Layer (Subcutaneous Tissue) Exposed exposed. There is no tunneling or undermining noted. There is a small amount of serous drainage noted. The wound margin is flat and intact. There is small (1-33%) pink granulation within the wound bed. There is no necrotic tissue within the wound bed. The periwound skin appearance exhibited: Erythema. The periwound skin appearance did not exhibit: Callus, Crepitus, Excoriation, Induration, Rash, Scarring, Dry/Scaly, Maceration, Atrophie Blanche, Cyanosis, Ecchymosis, Hemosiderin Staining, Mottled, Pallor, Rubor. The surrounding wound skin color is noted with erythema which is circumferential. Assessment Active Problems ICD-10 Type 2 diabetes mellitus with other skin ulcer Hagedorn, Daphney P. 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 There was a Excisional Skin/Subcutaneous Tissue Debridement with a total area of 0.3 sq cm performed by STONE III, HOYT E., PA-C. With the following instrument(s): Curette to remove Viable and Non-Viable tissue/material. Material removed includes Subcutaneous Tissue, Slough, and Skin: Dermis after achieving pain control using Lidocaine. No specimens were taken. A time out was conducted at 09:38, prior to the start of the procedure. A Minimum amount of bleeding was controlled  with Pressure. The procedure was tolerated well. Post Debridement Measurements: 0.6cm length x 0.5cm width x 0.1cm depth; 0.024cm^3 volume. Character of Wound/Ulcer Post Debridement is stable. Severity of Tissue Post Debridement is: Fat layer exposed. 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 There was a Three Layer Compression Therapy Procedure with a pre-treatment ABI of 1.3 by Army Melia, RN. 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 There was a Three Layer Compression Therapy Procedure with a pre-treatment ABI of 1.3 by Army Melia, RN. 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 There was a Three Layer Compression Therapy Procedure with a pre-treatment ABI of 1.3 by Army Melia, RN. Post procedure Diagnosis Wound #4: Same as Pre-Procedure Plan Wound Cleansing: Wound #1 Left,Medial Malleolus: Clean wound with Normal Saline. Cleanse wound  with mild soap and water May Shower, gently pat wound dry prior to applying new dressing. Wound #3 Left,Medial Lower Leg: Clean wound with Normal Saline. Cleanse wound with mild soap and water May Shower, gently pat wound dry prior to applying new dressing. Wound #4 Left,Lateral Lower Leg: Straub, Adline P. ([id number]) Clean wound with Normal Saline. Cleanse wound with mild soap and water May Shower, gently pat wound dry prior to applying new dressing. Anesthetic (add to Medication List): Wound #1 Left,Medial Malleolus: Topical Lidocaine 4% cream applied to wound bed prior to debridement (In Clinic Only). Wound #3 Left,Medial Lower Leg: Topical Lidocaine 4% cream applied to wound bed prior to debridement (In Clinic Only). Wound #4 Left,Lateral Lower Leg: Topical Lidocaine 4% cream applied to wound bed prior to debridement (In Clinic Only). Primary Wound Dressing: Wound #1 Left,Medial Malleolus: Silver Alginate Wound #3 Left,Medial Lower Leg: Silver Alginate Wound #4 Left,Lateral Lower Leg: Silver Alginate Secondary Dressing: Wound #1 Left,Medial Malleolus: ABD pad Wound #3 Left,Medial Lower Leg: ABD pad Wound #4 Left,Lateral Lower Leg: ABD pad Dressing Change Frequency: Wound #1 Left,Medial Malleolus: Change dressing every week Wound #3 Left,Medial Lower Leg: Change dressing every week Wound #4 Left,Lateral Lower Leg: Change dressing every week Follow-up Appointments: Wound #1 Left,Medial Malleolus: Return Appointment in 1 week. Wound #3 Left,Medial Lower Leg: Return Appointment in 1 week. Wound #4 Left,Lateral Lower Leg: Return Appointment in 1 week. Edema Control: Wound #1 Left,Medial Malleolus: 3 Layer Compression System - Left Lower Extremity - TCA ointment Other: - on the right leg Farrow 4000 compression wraps Put on in morning, remove at night before bed. Apply lotion to legs at night. Wound #3 Left,Medial Lower Leg: 3 Layer Compression System - Left Lower  Extremity - TCA ointment Other: - on the right leg Farrow 4000 compression wraps Put on in morning, remove at night before bed. Apply lotion to legs at night. Wound #4 Left,Lateral Lower Leg: 3 Layer Compression System - Left Lower Extremity - TCA ointment Other: - on the right leg Farrow 4000 compression wraps Put on in morning, remove at night before bed. Apply lotion to legs at night. Additional Orders / Instructions: Wound #1 Left,Medial Malleolus: Increase protein intake. Wound #3 Left,Medial Lower Leg: Increase protein intake. Wound #4 Left,Lateral Lower Leg: Increase protein intake. Jadis, Pitter Syrita P. ([id number]) Patient's wound  bed currently shows good progress on the recommend that we continue with the above wound care measures for the next week patient and her daughter in agreement with plan. We will subsequently see her for reevaluation at that time hopefully will get his clothes in short order. Please see above for specific wound care orders. We will see patient for re-evaluation in 1 week(s) here in the clinic. If anything worsens or changes patient will contact our office for additional recommendations. P one select all Electronic Signature(s) Signed: 04/05/2018 11:30:14 PM By: Worthy Keeler PA-C Entered By: Worthy Keeler on 04/02/2018 09:53:24 Luellen, Gena Mamie Nick ([id number]) -------------------------------------------------------------------------------- ROS/PFSH Details Patient Name: Mccaul, Kimla P. Date of Service: 04/02/2018 9:00 AM Medical Record Number: [id number] Patient Account Number: [id number] Date of Birth/Sex: 1940/10/06 (78 y.o. F) Treating RN: Army Melia Primary Care Provider: Johny Drilling Other Clinician: Referring Provider: Johny Drilling Treating Provider/Extender: Melburn Hake, HOYT Weeks in Treatment: 3 Information Obtained From Patient Wound History Do you currently have one or more open woundso Yes How many open wounds do you currently haveo  3 Approximately how long have you had your woundso months How have you been treating your wound(s) until nowo unna boots Has your wound(s) ever healed and then re-openedo No Have you had any lab work done in the past montho No Have you tested positive for an antibiotic resistant organism (MRSA, VRE)o No Have you tested positive for osteomyelitis (bone infection)o No Have you had any tests for circulation on your legso No Have you had other problems associated with your woundso Swelling Constitutional Symptoms (General Health) Complaints and Symptoms: Negative for: Fever; Chills Cardiovascular Complaints and Symptoms: Positive for: LE edema Medical History: Positive for: Angina; Coronary Artery Disease; Hypertension; Myocardial Infarction - more than 5 years ago; Peripheral Venous Disease Negative for: Arrhythmia; Congestive Heart Failure; Deep Vein Thrombosis; Hypotension; Peripheral Arterial Disease; Phlebitis; Vasculitis Eyes Medical History: Positive for: Cataracts - removed Negative for: Glaucoma; Optic Neuritis Ear/Nose/Mouth/Throat Medical History: Negative for: Chronic sinus problems/congestion; Middle ear problems Hematologic/Lymphatic Medical History: Negative for: Anemia; Hemophilia; Human Immunodeficiency Virus; Lymphedema; Sickle Cell Disease Respiratory Zwilling, Asyria P. ([id number]) Complaints and Symptoms: No Complaints or Symptoms Medical History: Negative for: Aspiration; Asthma; Chronic Obstructive Pulmonary Disease (COPD); Pneumothorax; Sleep Apnea; Tuberculosis Gastrointestinal Medical History: Negative for: Cirrhosis ; Colitis; Crohnos; Hepatitis A; Hepatitis B; Hepatitis C Endocrine Medical History: Positive for: Type II Diabetes Negative for: Type I Diabetes Genitourinary Medical History: Negative for: End Stage Renal Disease Past Medical History Notes: CKD stage 3 Immunological Medical History: Negative for: Lupus Erythematosus; Raynaudos;  Scleroderma Integumentary (Skin) Medical History: Negative for: History of Burn; History of pressure wounds Musculoskeletal Medical History: Positive for: Osteoarthritis Negative for: Gout; Rheumatoid Arthritis; Osteomyelitis Neurologic Medical History: Positive for: Neuropathy Negative for: Dementia; Quadriplegia; Paraplegia; Seizure Disorder Oncologic Medical History: Positive for: Received Chemotherapy; Received Radiation Past Medical History Notes: 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 ([id number]) Medical History: Negative for: Anorexia/bulimia; Confinement Anxiety HBO Extended History Items Eyes: Cataracts Immunizations Pneumococcal Vaccine: Received Pneumococcal Vaccination: Yes Implantable Devices No devices added Family and Social History Never smoker; Marital Status - Married; Alcohol Use: Never; Drug Use: No History; Caffeine Use: Never; Financial Concerns: No; Food, Clothing or Shelter Needs: No; Support System Lacking: No; Transportation Concerns: No; Advanced Directives: No; Patient does not want information on Advanced Directives Physician Affirmation I have reviewed and agree with the above information. Electronic  Signature(s) Signed: 04/03/2018 3:17:07 PM By: Army Melia Signed: 04/05/2018 11:30:14 PM By: Worthy Keeler PA-C Entered By: Worthy Keeler on 04/02/2018 09:52:27 Graw, Kendi P. ([id number]) -------------------------------------------------------------------------------- SuperBill Details Patient Name: Hailes, Hilma P. Date of Service: 04/02/2018 Medical Record Number: [id number] Patient Account Number: [id number] Date of Birth/Sex: [birth year] (78 y.o. 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 I73.89 Other specified peripheral vascular diseases I10 Essential (primary) hypertension Facility Procedures CPT4 Code: 95621308 Description: 65784 - DEB SUBQ TISSUE 20 SQ CM/< ICD-10 Diagnosis Description L97.322 Non-pressure chronic ulcer of left ankle with fat layer expo Modifier: sed Quantity: 1 Physician Procedures CPT4 Code: 6962952 Description: 11042 - WC PHYS SUBQ TISS 20 SQ CM ICD-10 Diagnosis Description W41.324 Non-pressure chronic ulcer of left ankle with fat layer expo Modifier: sed Quantity: 1 Electronic Signature(s) Signed: 04/05/2018 11:30:14 PM By: Worthy Keeler PA-C Entered By: Worthy Keeler on 04/02/2018 09:53:34

## 2018-04-05 NOTE — Progress Notes (Signed)
Theresa [name] ([id number]) Visit Report for 04/02/2018 Arrival Information Details Patient Name: Theresa [name], Theresa [name]. Date of Service: 04/02/2018 9:00 AM Medical Record Number: [id number] Patient Account Number: [id number] Date of Birth/Sex: 12-21-1940 (77 y.o. F) Treating RN: Army Melia Primary Care Ketura Sirek: Johny Drilling Other Clinician: Referring Ethel Meisenheimer: Johny Drilling Treating Sipriano Fendley/Extender: Melburn Hake, HOYT Weeks in Treatment: 3 Visit Information History Since Last Visit Added or deleted any medications: No Patient Arrived: Wheel Chair Any new allergies or adverse reactions: No Arrival Time: 08:57 Had a fall or experienced change in No Accompanied By: daughter activities of daily living that may affect Transfer Assistance: Manual risk of falls: Patient Identification Verified: Yes Signs or symptoms of abuse/neglect since last visito No Secondary Verification Process Yes Hospitalized since last visit: No Completed: Implantable device outside of the clinic excluding No Patient Has Alerts: Yes cellular tissue based products placed in the center Patient Alerts: Patient on Blood since last visit: Thinner Has Dressing in Place as Prescribed: Yes Plavix, 81mg  aspirin Pain Present Now: No Type II Diabetic 03/06/2018 ABI AVVS (L) 1.28 (R) 1.22 Electronic Signature(s) Signed: 04/02/2018 3:04:07 PM By: Lorine Bears RCP, RRT, CHT Entered By: Lorine Bears on 04/02/2018 08:58:46 Haluska, Adelai P. ([id number]) -------------------------------------------------------------------------------- Compression Therapy Details Patient Name: [name], [name] Date of Service: 04/02/2018 9:00 AM Medical Record Number: [id number] Patient Account Number: [id number] Date of Birth/Sex: 05/06/1940 (77 y.o. F) Treating RN: Army Melia Primary Care Kennadie Brenner: Johny Drilling Other Clinician: Referring Deanglo Hissong: Johny Drilling Treating Charyl Minervini/Extender: Melburn Hake,  HOYT Weeks in Treatment: 3 Compression Therapy Performed for Wound Assessment: Wound #3 Left,Medial Lower Leg Performed By: Clinician Army Melia, RN Compression Type: Three Layer Pre Treatment ABI: 1.3 Post Procedure Diagnosis Same as Pre-procedure Electronic Signature(s) Signed: 04/03/2018 3:17:07 PM By: Army Melia Entered By: Army Melia on 04/02/2018 09:49:39 Masur, Hydeia P. ([id number]) -------------------------------------------------------------------------------- Compression Therapy Details Patient Name: [name], [name] Date of Service: 04/02/2018 9:00 AM Medical Record Number: [id number] Patient Account Number: [id number] Date of Birth/Sex: 17-May-1940 (77 y.o. F) Treating RN: Army Melia Primary Care Keileigh Vahey: Johny Drilling Other Clinician: Referring Greogory Cornette: Johny Drilling Treating Rayya Yagi/Extender: Melburn Hake, HOYT Weeks in Treatment: 3 Compression Therapy Performed for Wound Assessment: Wound #1 Left,Medial Malleolus Performed By: Clinician Army Melia, RN Compression Type: Three Layer Pre Treatment ABI: 1.3 Post Procedure Diagnosis Same as Pre-procedure Electronic Signature(s) Signed: 04/03/2018 3:17:07 PM By: Army Melia Entered By: Army Melia on 04/02/2018 09:49:39 Ciresi, Connee P. ([id number]) -------------------------------------------------------------------------------- Compression Therapy Details Patient Name: [name], [name] Date of Service: 04/02/2018 9:00 AM Medical Record Number: [id number] Patient Account Number: [id number] Date of Birth/Sex: 02/28/1940 (77 y.o. F) Treating RN: Army Melia Primary Care Chassie Pennix: Johny Drilling Other Clinician: Referring Ikhlas Albo: Johny Drilling Treating Anacaren Kohan/Extender: Melburn Hake, HOYT Weeks in Treatment: 3 Compression Therapy Performed for Wound Assessment: Wound #4 Left,Lateral Lower Leg Performed By: Clinician Army Melia, RN Compression Type: Three Layer Pre Treatment ABI: 1.3 Post Procedure  Diagnosis Same as Pre-procedure Electronic Signature(s) Signed: 04/03/2018 3:17:07 PM By: Army Melia Entered By: Army Melia on 04/02/2018 09:49:39 Nohr, Carnisha P. ([id number]) -------------------------------------------------------------------------------- Encounter Discharge Information Details Patient Name: [name], [name] Date of Service: 04/02/2018 9:00 AM Medical Record Number: [id number] Patient Account Number: [id number] Date of Birth/Sex: 1940-04-25 (77 y.o. F) Treating RN: Harold Barban Primary Care Aradhana Gin: Johny Drilling Other Clinician: Referring Taitum Alms: Johny Drilling Treating Stefanny Pieri/Extender: Melburn Hake, HOYT Weeks in Treatment: 3 Encounter Discharge Information Items Post Procedure Vitals Discharge Condition: Stable Temperature (F): 97.5 Ambulatory Status: Wheelchair Pulse (  bpm): 72 Discharge Destination: Home Respiratory Rate (breaths/min): 18 Transportation: Private Auto Blood Pressure (mmHg): 185/71 Accompanied By: daughter Schedule Follow-up Appointment: Yes Clinical Summary of Care: Electronic Signature(s) Signed: 04/03/2018 3:21:09 PM By: Harold Barban Entered By: Harold Barban on 04/02/2018 10:14:56 Dalsanto, Anisten P. ([id number]) -------------------------------------------------------------------------------- Lower Extremity Assessment Details Patient Name: [name], [name] Date of Service: 04/02/2018 9:00 AM Medical Record Number: [id number] Patient Account Number: [id number] Date of Birth/Sex: 1940/04/26 (77 y.o. F) Treating RN: Harold Barban Primary Care Dmiya Malphrus: Johny Drilling Other Clinician: Referring Kalina Morabito: Johny Drilling Treating Shimeka Bacot/Extender: Melburn Hake, HOYT Weeks in Treatment: 3 Electronic Signature(s) Signed: 04/03/2018 3:21:09 PM By: Harold Barban Entered By: Harold Barban on 04/02/2018 09:20:59 Zook, Tavionna P. ([id number]) -------------------------------------------------------------------------------- Multi  Wound Chart Details Patient Name: [name], [name] Date of Service: 04/02/2018 9:00 AM Medical Record Number: [id number] Patient Account Number: [id number] Date of Birth/Sex: 1940-02-22 (77 y.o. F) Treating RN: Army Melia Primary Care Woodward Klem: Johny Drilling Other Clinician: Referring Myla Mauriello: Johny Drilling Treating Wren Gallaga/Extender: Melburn Hake, HOYT Weeks in Treatment: 3 Vital Signs Height(in): 59 Pulse(bpm): 47 Weight(lbs): 216 Blood Pressure(mmHg): 185/71 Body Mass Index(BMI): 34 Temperature(F): 97.5 Respiratory Rate 18 (breaths/min): Photos: Wound Location: Left Malleolus - Medial Left Lower Leg - Medial Left Lower Leg - Lateral Wounding Event: Gradually Appeared Gradually Appeared Gradually Appeared Primary Etiology: Diabetic Wound/Ulcer of the Diabetic Wound/Ulcer of the Diabetic Wound/Ulcer of the Lower Extremity Lower Extremity Lower Extremity Secondary Etiology: Venous Leg Ulcer N/A N/A Comorbid History: Cataracts, Angina, Coronary Cataracts, Angina, Coronary Cataracts, Angina, Coronary Artery Disease, Hypertension, Artery Disease, Hypertension, Artery Disease, Hypertension, Myocardial Infarction, Myocardial Infarction, Myocardial Infarction, Peripheral Venous Disease, Peripheral Venous Disease, Peripheral Venous Disease, Type II Diabetes, Type II Diabetes, Type II Diabetes, Osteoarthritis, Neuropathy, Osteoarthritis, Neuropathy, Osteoarthritis, Neuropathy, Received Chemotherapy, Received Chemotherapy, Received Chemotherapy, Received Radiation Received Radiation Received Radiation Date Acquired: 12/07/2017 01/29/2016 03/29/2018 Weeks of Treatment: 3 0 0 Wound Status: Open Open Open Measurements L x W x D 0.6x0.5x0.1 1x0.4x0.1 1.2x2x0.1 (cm) Area (cm) : 0.236 0.314 1.885 Volume (cm) : 0.024 0.031 0.188 % Reduction in Area: 62.40% 0.00% 0.00% % Reduction in Volume: 61.90% 0.00% 0.00% Classification: Grade 2 Grade 1 Grade 2 Exudate Amount: Small Small  Small Exudate Type: Serous Serous Serous Exudate Color: amber amber amber Wound Margin: Flat and Intact Flat and Intact Flat and Intact Granulation Amount: None Present (0%) Small (1-33%) Small (1-33%) Granulation Quality: N/A Pink Pink Bojarski, Maryetta P. ([id number]) Necrotic Amount: Small (1-33%) Medium (34-66%) None Present (0%) Exposed Structures: Fat Layer (Subcutaneous Fat Layer (Subcutaneous Fat Layer (Subcutaneous Tissue) Exposed: Yes Tissue) Exposed: Yes Tissue) Exposed: Yes Fascia: No Fascia: No Fascia: No Tendon: No Tendon: No Tendon: No Muscle: No Muscle: No Muscle: No Joint: No Joint: No Joint: No Bone: No Bone: No Bone: No Epithelialization: None Small (1-33%) None Periwound Skin Texture: Excoriation: No No Abnormalities Noted Excoriation: No Induration: No Induration: No Callus: No Callus: No Crepitus: No Crepitus: No Rash: No Rash: No Scarring: No Scarring: No Periwound Skin Moisture: Maceration: No No Abnormalities Noted Maceration: No Dry/Scaly: No Dry/Scaly: No Periwound Skin Color: Atrophie Blanche: No Erythema: Yes Erythema: Yes Cyanosis: No Atrophie Blanche: No Ecchymosis: No Cyanosis: No Erythema: No Ecchymosis: No Hemosiderin Staining: No Hemosiderin Staining: No Mottled: No Mottled: No Pallor: No Pallor: No Rubor: No Rubor: No Erythema Location: N/A Circumferential Circumferential Temperature: No Abnormality N/A N/A Tenderness on Palpation: No No No Wound Preparation: Ulcer Cleansing: Other: soap Ulcer Cleansing: Ulcer Cleansing: and water Rinsed/Irrigated with Saline Rinsed/Irrigated with Saline  Topical Anesthetic Applied: Topical Anesthetic Applied: Topical Anesthetic Applied: Other: lidocaine 4% Other: lidocaine 4% Other: lidocaine 4% Treatment Notes Electronic Signature(s) Signed: 04/03/2018 3:17:07 PM By: Army Melia Entered By: Army Melia on 04/02/2018 09:34:46 Travaglini, Bryahna Mamie Nick  ([id number]) -------------------------------------------------------------------------------- [place] Details Patient Name: Nicklas, Lasheika P. Date of Service: 04/02/2018 9:00 AM Medical Record Number: [id number] Patient Account Number: [id number] Date of Birth/Sex: [birth year] (77 y.o. F) Treating RN: Army Melia Primary Care Lagina Reader: Johny Drilling Other Clinician: Referring Samirah Scarpati: Johny Drilling Treating Psalm Arman/Extender: Melburn Hake, HOYT Weeks in Treatment: 3 Active Inactive Abuse / Safety / Falls / Self Care Management Nursing Diagnoses: Potential for falls Goals: Patient will remain injury free related to falls Date Initiated: 03/09/2018 Target Resolution Date: 03/27/2018 Goal Status: Active Interventions: Assess fall risk on admission and as needed Notes: Medication Nursing Diagnoses: Knowledge deficit related to medication safety: actual or potential Goals: Patient/caregiver will demonstrate understanding of all current medications Date Initiated: 03/09/2018 Target Resolution Date: 03/27/2018 Goal Status: Active Interventions: Assess for medication contraindications each visit where new medications are prescribed Notes: Orientation to the Wound Care Program Nursing Diagnoses: Knowledge deficit related to the wound healing center program Goals: Patient/caregiver will verbalize understanding of the [facility] Date Initiated: 03/09/2018 Target Resolution Date: 03/27/2018 Goal Status: Active Interventions: Provide education on orientation to the wound center Niemann, Loriel P. ([id number]) Notes: Venous Leg Ulcer Nursing Diagnoses: Potential for venous Insuffiency (use before diagnosis confirmed) Goals: Non-invasive venous studies are completed as ordered Date Initiated: 03/09/2018 Target Resolution Date: 03/18/2018 Goal Status: Active Interventions: Assess peripheral edema status every visit. Treatment Activities: Non-invasive  vascular studies : 03/09/2018 Venous Duplex Doppler : 03/18/2018 Notes: Wound/Skin Impairment Nursing Diagnoses: Impaired tissue integrity Goals: Patient/caregiver will verbalize understanding of skin care regimen Date Initiated: 03/09/2018 Target Resolution Date: 03/27/2018 Goal Status: Active Interventions: Assess patient/caregiver ability to obtain necessary supplies Treatment Activities: Patient referred to home care : 03/09/2018 Notes: Electronic Signature(s) Signed: 04/03/2018 3:17:07 PM By: Army Melia Entered By: Army Melia on 04/02/2018 09:34:37 Constancio, Anaiz P. ([id number]) -------------------------------------------------------------------------------- Pain Assessment Details Patient Name: Cowens, Stephana P. Date of Service: 04/02/2018 9:00 AM Medical Record Number: [id number] Patient Account Number: [id number] Date of Birth/Sex: 31-Mar-1940 (77 y.o. F) Treating RN: Army Melia Primary Care Caily Rakers: Johny Drilling Other Clinician: Referring Jerrica Thorman: Johny Drilling Treating Masson Nalepa/Extender: Melburn Hake, HOYT Weeks in Treatment: 3 Active Problems Location of Pain Severity and Description of Pain Patient Has Paino No Site Locations Pain Management and Medication Current Pain Management: Electronic Signature(s) Signed: 04/02/2018 3:04:07 PM By: Lorine Bears RCP, RRT, CHT Signed: 04/03/2018 3:17:07 PM By: Army Melia Entered By: Lorine Bears on 04/02/2018 08:58:57 Walter, Katilynn Mamie Nick ([id number]) -------------------------------------------------------------------------------- Patient/Caregiver Education Details Patient Name: Thier, Danniel P. Date of Service: 04/02/2018 9:00 AM Medical Record Number: [id number] Patient Account Number: [id number] Date of Birth/Gender: April 04, 1940 (78 y.o. F) Treating RN: Army Melia Primary Care Physician: Johny Drilling Other Clinician: Referring Physician: Johny Drilling Treating Physician/Extender: Sharalyn Ink in Treatment: 3 Education Assessment Education Provided To: Patient Education Topics Provided Wound/Skin Impairment: Handouts: Caring for Your Ulcer Methods: Demonstration, Explain/Verbal Responses: State content correctly Electronic Signature(s) Signed: 04/03/2018 3:21:09 PM By: Harold Barban Entered By: Harold Barban on 04/02/2018 10:15:02 Soderlund, [place]. ([id number]) -------------------------------------------------------------------------------- Wound Assessment Details Patient Name: Vorce, Sky P. Date of Service: 04/02/2018 9:00 AM Medical Record Number: [id number] Patient Account Number: [id number] Date of Birth/Sex: 1940-04-09 (77 y.o. F) Treating RN: Harold Barban Primary Care Cniyah Sproull: Johny Drilling Other  Clinician: Referring Nikyah Lackman: Johny Drilling Treating Gurpreet Mariani/Extender: Melburn Hake, HOYT Weeks in Treatment: 3 Wound Status Wound Number: 1 Primary Diabetic Wound/Ulcer of the Lower Extremity Etiology: Wound Location: Left Malleolus - Medial Secondary Venous Leg Ulcer Wounding Event: Gradually Appeared Etiology: Date Acquired: 12/07/2017 Wound Open Weeks Of Treatment: 3 Status: Clustered Wound: No Comorbid Cataracts, Angina, Coronary Artery Disease, History: Hypertension, Myocardial Infarction, Peripheral Venous Disease, Type II Diabetes, Osteoarthritis, Neuropathy, Received Chemotherapy, Received Radiation Photos Wound Measurements Length: (cm) 0.6 % Reduction i Width: (cm) 0.5 % Reduction i Depth: (cm) 0.1 Epithelializa Area: (cm) 0.236 Tunneling: Volume: (cm) 0.024 Undermining: n Area: 62.4% n Volume: 61.9% tion: None No No Wound Description Classification: Grade 2 Foul Odor Aft Wound Margin: Flat and Intact Slough/Fibrin Exudate Amount: Small Exudate Type: Serous Exudate Color: amber er Cleansing: No o Yes Wound Bed Granulation Amount: None Present (0%) Exposed Structure Necrotic Amount: Small (1-33%) Fascia  Exposed: No Necrotic Quality: Adherent Slough Fat Layer (Subcutaneous Tissue) Exposed: Yes Tendon Exposed: No Muscle Exposed: No Joint Exposed: No Schouten, Julietta P. ([id number]) Bone Exposed: No Periwound Skin Texture Texture Color No Abnormalities Noted: No No Abnormalities Noted: No Callus: No Atrophie Blanche: No Crepitus: No Cyanosis: No Excoriation: No Ecchymosis: No Induration: No Erythema: No Rash: No Hemosiderin Staining: No Scarring: No Mottled: No Pallor: No Moisture Rubor: No No Abnormalities Noted: No Dry / Scaly: No Temperature / Pain Maceration: No Temperature: No Abnormality Wound Preparation Ulcer Cleansing: Other: soap and water, Topical Anesthetic Applied: Other: lidocaine 4%, Treatment Notes Wound #1 (Left, Medial Malleolus) Notes TCA cream, Silver alginate, ABD, 3-Layer Electronic Signature(s) Signed: 04/03/2018 3:21:09 PM By: Harold Barban Entered By: Harold Barban on 04/02/2018 09:19:25 Clock, Temica P. ([id number]) -------------------------------------------------------------------------------- Wound Assessment Details Patient Name: Haseman, Alaa P. Date of Service: 04/02/2018 9:00 AM Medical Record Number: [id number] Patient Account Number: [id number] Date of Birth/Sex: [birth year] (77 y.o. F) Treating RN: Harold Barban Primary Care Mikah Poss: Johny Drilling Other Clinician: Referring Glendale Youngblood: Johny Drilling Treating Sandee Bernath/Extender: Melburn Hake, HOYT Weeks in Treatment: 3 Wound Status Wound Number: 3 Primary Diabetic Wound/Ulcer of the Lower Extremity Etiology: Wound Location: Left Lower Leg - Medial Wound Open Wounding Event: Gradually Appeared Status: Date Acquired: 01/29/2016 Comorbid Cataracts, Angina, Coronary Artery Disease, Weeks Of Treatment: 0 History: Hypertension, Myocardial Infarction, Peripheral Clustered Wound: No Venous Disease, Type II Diabetes, Osteoarthritis, Neuropathy, Received Chemotherapy,  Received Radiation Photos Wound Measurements Length: (cm) 1 % Reduction in Width: (cm) 0.4 % Reduction in Depth: (cm) 0.1 Epithelializat Area: (cm) 0.314 Tunneling: Volume: (cm) 0.031 Undermining: Area: 0% Volume: 0% ion: Small (1-33%) No No Wound Description Classification: Grade 1 Foul Odor Afte Wound Margin: Flat and Intact Slough/Fibrino Exudate Amount: Small Exudate Type: Serous Exudate Color: amber r Cleansing: No Yes Wound Bed Granulation Amount: Small (1-33%) Exposed Structure Granulation Quality: Pink Fascia Exposed: No Necrotic Amount: Medium (34-66%) Fat Layer (Subcutaneous Tissue) Exposed: Yes Necrotic Quality: Adherent Slough Tendon Exposed: No Muscle Exposed: No Joint Exposed: No Bone Exposed: No Noorani, Marquerite P. ([id number]) Periwound Skin Texture Texture Color No Abnormalities Noted: No No Abnormalities Noted: No Erythema: Yes Moisture Erythema Location: Circumferential No Abnormalities Noted: No Wound Preparation Ulcer Cleansing: Rinsed/Irrigated with Saline Topical Anesthetic Applied: Other: lidocaine 4%, Treatment Notes Wound #3 (Left, Medial Lower Leg) Notes TCA cream, Silver alginate, ABD, 3-Layer Electronic Signature(s) Signed: 04/03/2018 3:21:09 PM By: Harold Barban Entered By: Harold Barban on 04/02/2018 09:20:04 Miner, Liyana P. ([id number]) -------------------------------------------------------------------------------- Wound Assessment Details Patient Name: Fuston, Tora P. Date of Service: 04/02/2018 9:00 AM  Medical Record Number: [id number] Patient Account Number: [id number] Date of Birth/Sex: 29-Apr-1940 (77 y.o. F) Treating RN: Harold Barban Primary Care Mikele Sifuentes: Johny Drilling Other Clinician: Referring Ashantee Deupree: Johny Drilling Treating Janiah Devinney/Extender: Melburn Hake, HOYT Weeks in Treatment: 3 Wound Status Wound Number: 4 Primary Diabetic Wound/Ulcer of the Lower Extremity Etiology: Wound Location: Left Lower  Leg - Lateral Wound Open Wounding Event: Gradually Appeared Status: Date Acquired: 03/29/2018 Comorbid Cataracts, Angina, Coronary Artery Disease, Weeks Of Treatment: 0 History: Hypertension, Myocardial Infarction, Peripheral Clustered Wound: No Venous Disease, Type II Diabetes, Osteoarthritis, Neuropathy, Received Chemotherapy, Received Radiation Photos Wound Measurements Length: (cm) 1.2 % Reduction i Width: (cm) 2 % Reduction i Depth: (cm) 0.1 Epithelializa Area: (cm) 1.885 Tunneling: Volume: (cm) 0.188 Undermining: n Area: 0% n Volume: 0% tion: None No No Wound Description Classification: Grade 2 Foul Odor Aft Wound Margin: Flat and Intact Slough/Fibrin Exudate Amount: Small Exudate Type: Serous Exudate Color: amber er Cleansing: No o No Wound Bed Granulation Amount: Small (1-33%) Exposed Structure Granulation Quality: Pink Fascia Exposed: No Necrotic Amount: None Present (0%) Fat Layer (Subcutaneous Tissue) Exposed: Yes Tendon Exposed: No Muscle Exposed: No Joint Exposed: No Bone Exposed: No Lindaman, Aimar P. ([id number]) Periwound Skin Texture Texture Color No Abnormalities Noted: No No Abnormalities Noted: No Callus: No Atrophie Blanche: No Crepitus: No Cyanosis: No Excoriation: No Ecchymosis: No Induration: No Erythema: Yes Rash: No Erythema Location: Circumferential Scarring: No Hemosiderin Staining: No Mottled: No Moisture Pallor: No No Abnormalities Noted: No Rubor: No Dry / Scaly: No Maceration: No Wound Preparation Ulcer Cleansing: Rinsed/Irrigated with Saline Topical Anesthetic Applied: Other: lidocaine 4%, Treatment Notes Wound #4 (Left, Lateral Lower Leg) Notes TCA cream, Silver alginate, ABD, 3-Layer Electronic Signature(s) Signed: 04/03/2018 3:21:09 PM By: Harold Barban Entered By: Harold Barban on 04/02/2018 09:20:26 Rossie, Hannalee P.  ([id number]) -------------------------------------------------------------------------------- Vitals Details Patient Name: Amendola, Zamyah P. Date of Service: 04/02/2018 9:00 AM Medical Record Number: [id number] Patient Account Number: [id number] Date of Birth/Sex: 1940-09-26 (77 y.o. F) Treating RN: Army Melia Primary Care Anahit Klumb: Johny Drilling Other Clinician: Referring Shalonda Sachse: Johny Drilling Treating Bryanna Yim/Extender: Melburn Hake, HOYT Weeks in Treatment: 3 Vital Signs Time Taken: 08:59 Temperature (F): 97.5 Height (in): 67 Pulse (bpm): 72 Weight (lbs): 216 Respiratory Rate (breaths/min): 18 Body Mass Index (BMI): 33.8 Blood Pressure (mmHg): 185/71 Reference Range: 80 - 120 mg / dl Electronic Signature(s) Signed: 04/02/2018 3:04:07 PM By: Lorine Bears RCP, RRT, CHT Entered By: Lorine Bears on 04/02/2018 09:03:21

## 2018-04-09 ENCOUNTER — Encounter: Payer: Medicare HMO | Admitting: Physician Assistant

## 2018-04-09 DIAGNOSIS — E11622 Type 2 diabetes mellitus with other skin ulcer: Secondary | ICD-10-CM | POA: Diagnosis not present

## 2018-04-11 NOTE — Progress Notes (Signed)
Theresa [name] ([id number]) Visit Report for 04/09/2018 Arrival Information Details Patient Name: Theresa [name], Theresa [name]. Date of Service: 04/09/2018 10:00 AM Medical Record Number: [id number] Patient Account Number: [id number] Date of Birth/Sex: 1940/12/18 (78 y.o. F) Treating RN: Army Melia Primary Care Layia Walla: Johny Drilling Other Clinician: Referring Zadin Lange: Johny Drilling Treating Katelyne Galster/Extender: Melburn Hake, HOYT Weeks in Treatment: 4 Visit Information History Since Last Visit Added or deleted any medications: No Patient Arrived: Wheel Chair Any new allergies or adverse reactions: No Arrival Time: 09:58 Had a fall or experienced change in No Accompanied By: daughter activities of daily living that may affect Transfer Assistance: Manual risk of falls: Patient Identification Verified: Yes Signs or symptoms of abuse/neglect since last visito No Secondary Verification Process Yes Hospitalized since last visit: No Completed: Implantable device outside of the clinic excluding No Patient Has Alerts: Yes cellular tissue based products placed in the center Patient Alerts: Patient on Blood since last visit: Thinner Has Dressing in Place as Prescribed: Yes Plavix, 81mg  aspirin Pain Present Now: No Type II Diabetic 03/06/2018 ABI AVVS (L) 1.28 (R) 1.22 Electronic Signature(s) Signed: 04/09/2018 3:01:47 PM By: Lorine Bears RCP, RRT, CHT Entered By: Becky Sax, Amado Nash on 04/09/2018 09:59:16 Beddow, Surya P. ([id number]) -------------------------------------------------------------------------------- Lower Extremity Assessment Details Patient Name: Fauth, Ova P. Date of Service: 04/09/2018 10:00 AM Medical Record Number: [id number] Patient Account Number: [id number] Date of Birth/Sex: [birth year] (78 y.o. F) Treating RN: Montey Hora Primary Care Jaslin Novitski: Johny Drilling Other Clinician: Referring Hodari Chuba: Johny Drilling Treating Estie Sproule/Extender:  Melburn Hake, HOYT Weeks in Treatment: 4 Edema Assessment Assessed: [Left: No] [Right: No] Edema: [Left: Yes] [Right: Yes] Calf Left: Right: Point of Measurement: 34 cm From Medial Instep 37 cm cm Ankle Left: Right: Point of Measurement: 13 cm From Medial Instep 24 cm cm Vascular Assessment Pulses: Dorsalis Pedis Palpable: [Left:Yes] Posterior Tibial Extremity colors, hair growth, and conditions: Extremity Color: [Left:Normal] Hair Growth on Extremity: [Left:No] Temperature of Extremity: [Left:Warm] Capillary Refill: [Left:< 3 seconds] Toe Nail Assessment Left: Right: Thick: Yes Discolored: Yes Deformed: No Improper Length and Hygiene: No Electronic Signature(s) Signed: 04/09/2018 2:44:29 PM By: Montey Hora Entered By: Montey Hora on 04/09/2018 10:08:58 Gotts, Madaleine P. ([id number]) -------------------------------------------------------------------------------- Multi Wound Chart Details Patient Name: Mollenhauer, Carrol P. Date of Service: 04/09/2018 10:00 AM Medical Record Number: [id number] Patient Account Number: [id number] Date of Birth/Sex: Mar 26, 1940 (78 y.o. F) Treating RN: Army Melia Primary Care Jameek Bruntz: Johny Drilling Other Clinician: Referring Keandre Linden: Johny Drilling Treating Aniyha Tate/Extender: Melburn Hake, HOYT Weeks in Treatment: 4 Vital Signs Height(in): 36 Pulse(bpm): 62 Weight(lbs): 216 Blood Pressure(mmHg): 139/62 Body Mass Index(BMI): 34 Temperature(F): 97.6 Respiratory Rate 18 (breaths/min): Photos: Wound Location: Left Malleolus - Medial Left Lower Leg - Medial Left Lower Leg - Lateral Wounding Event: Gradually Appeared Gradually Appeared Gradually Appeared Primary Etiology: Diabetic Wound/Ulcer of the Diabetic Wound/Ulcer of the Diabetic Wound/Ulcer of the Lower Extremity Lower Extremity Lower Extremity Secondary Etiology: Venous Leg Ulcer N/A N/A Comorbid History: Cataracts, Angina, Coronary Cataracts, Angina, Coronary Cataracts, Angina,  Coronary Artery Disease, Hypertension, Artery Disease, Hypertension, Artery Disease, Hypertension, Myocardial Infarction, Myocardial Infarction, Myocardial Infarction, Peripheral Venous Disease, Peripheral Venous Disease, Peripheral Venous Disease, Type II Diabetes, Type II Diabetes, Type II Diabetes, Osteoarthritis, Neuropathy, Osteoarthritis, Neuropathy, Osteoarthritis, Neuropathy, Received Chemotherapy, Received Chemotherapy, Received Chemotherapy, Received Radiation Received Radiation Received Radiation Date Acquired: 12/07/2017 01/29/2016 03/29/2018 Weeks of Treatment: 4 1 1  Wound Status: Open Open Open Measurements L x W x D 0.5x0.4x0.1 0.1x0.1x0.1 0.5x0.4x0.1 (cm) Area (cm) : 0.157 0.008 0.157 Volume (  cm) : 0.016 0.001 0.016 % Reduction in Area: 75.00% 97.50% 91.70% % Reduction in Volume: 74.60% 96.80% 91.50% Classification: Grade 2 Grade 1 Grade 2 Exudate Amount: Small None Present Small Exudate Type: Serous N/A Serous Exudate Color: amber N/A amber Wound Margin: Flat and Intact Flat and Intact Flat and Intact Granulation Amount: None Present (0%) None Present (0%) Large (67-100%) Granulation Quality: N/A N/A Pink Coupe, Maricel P. ([id number]) Necrotic Amount: Small (1-33%) None Present (0%) Small (1-33%) Necrotic Tissue: Eschar N/A Adherent Slough Exposed Structures: Fat Layer (Subcutaneous Fat Layer (Subcutaneous Fat Layer (Subcutaneous Tissue) Exposed: Yes Tissue) Exposed: Yes Tissue) Exposed: Yes Fascia: No Fascia: No Fascia: No Tendon: No Tendon: No Tendon: No Muscle: No Muscle: No Muscle: No Joint: No Joint: No Joint: No Bone: No Bone: No Bone: No Epithelialization: None Large (67-100%) Medium (34-66%) Periwound Skin Texture: Excoriation: No No Abnormalities Noted Excoriation: No Induration: No Induration: No Callus: No Callus: No Crepitus: No Crepitus: No Rash: No Rash: No Scarring: No Scarring: No Periwound Skin Moisture: Maceration: No No  Abnormalities Noted Maceration: No Dry/Scaly: No Dry/Scaly: No Periwound Skin Color: Atrophie Blanche: No Erythema: Yes Erythema: Yes Cyanosis: No Atrophie Blanche: No Ecchymosis: No Cyanosis: No Erythema: No Ecchymosis: No Hemosiderin Staining: No Hemosiderin Staining: No Mottled: No Mottled: No Pallor: No Pallor: No Rubor: No Rubor: No Erythema Location: N/A Circumferential Circumferential Temperature: No Abnormality No Abnormality No Abnormality Tenderness on Palpation: No No No Wound Preparation: Ulcer Cleansing: Other: soap Ulcer Cleansing: Ulcer Cleansing: Other: soap and water Rinsed/Irrigated with Saline, and water Other: soap and water Topical Anesthetic Applied: Topical Anesthetic Applied: Other: lidocaine 4% Topical Anesthetic Applied: Other: lidocaine 4% None Treatment Notes Electronic Signature(s) Signed: 04/10/2018 3:17:15 PM By: Army Melia Entered By: Army Melia on 04/09/2018 10:31:10 Vanleeuwen, Amyjo P. ([id number]) -------------------------------------------------------------------------------- Multi-Disciplinary Care Plan Details Patient Name: Pemble, Helaine P. Date of Service: 04/09/2018 10:00 AM Medical Record Number: [id number] Patient Account Number: [id number] Date of Birth/Sex: 14-May-1940 (77 y.o. F) Treating RN: Army Melia Primary Care Tishawna Larouche: Johny Drilling Other Clinician: Referring Christna Kulick: Johny Drilling Treating Yuniel Blaney/Extender: Melburn Hake, HOYT Weeks in Treatment: 4 Active Inactive Abuse / Safety / Falls / Self Care Management Nursing Diagnoses: Potential for falls Goals: Patient will remain injury free related to falls Date Initiated: 03/09/2018 Target Resolution Date: 03/27/2018 Goal Status: Active Interventions: Assess fall risk on admission and as needed Notes: Medication Nursing Diagnoses: Knowledge deficit related to medication safety: actual or potential Goals: Patient/caregiver will demonstrate understanding of  all current medications Date Initiated: 03/09/2018 Target Resolution Date: 03/27/2018 Goal Status: Active Interventions: Assess for medication contraindications each visit where new medications are prescribed Notes: Orientation to the Wound Care Program Nursing Diagnoses: Knowledge deficit related to the wound healing center program Goals: Patient/caregiver will verbalize understanding of the [facility] Date Initiated: 03/09/2018 Target Resolution Date: 03/27/2018 Goal Status: Active Interventions: Provide education on orientation to the wound center Goh, Kaedynce P. ([id number]) Notes: Venous Leg Ulcer Nursing Diagnoses: Potential for venous Insuffiency (use before diagnosis confirmed) Goals: Non-invasive venous studies are completed as ordered Date Initiated: 03/09/2018 Target Resolution Date: 03/18/2018 Goal Status: Active Interventions: Assess peripheral edema status every visit. Treatment Activities: Non-invasive vascular studies : 03/09/2018 Venous Duplex Doppler : 03/18/2018 Notes: Wound/Skin Impairment Nursing Diagnoses: Impaired tissue integrity Goals: Patient/caregiver will verbalize understanding of skin care regimen Date Initiated: 03/09/2018 Target Resolution Date: 03/27/2018 Goal Status: Active Interventions: Assess patient/caregiver ability to obtain necessary supplies Treatment Activities: Patient referred to home care : 03/09/2018 Notes:  Electronic Signature(s) Signed: 04/10/2018 3:17:15 PM By: Army Melia Entered By: Army Melia on 04/09/2018 10:30:53 Haymond, Alyvia P. ([id number]) -------------------------------------------------------------------------------- Non-Wound Condition Assessment Details Patient Name: Bluemel, Tanessa P. Date of Service: 04/09/2018 10:00 AM Medical Record Number: [id number] Patient Account Number: [id number] Date of Birth/Sex: Jun 22, 1940 (77 y.o. F) Treating RN: Montey Hora Primary Care Dontarious Schaum: Johny Drilling Other Clinician: Referring Billye Pickerel: Johny Drilling Treating Naveya Ellerman/Extender: Melburn Hake, HOYT Weeks in Treatment: 4 Non-Wound Condition: Condition: Other Dermatologic Condition Location: Leg Side: Right Photos Periwound Skin Texture Texture Color No Abnormalities Noted: No No Abnormalities Noted: No Moisture Temperature / Pain No Abnormalities Noted: No Temperature: No Abnormality Notes patient with blister on her right lower Electronic Signature(s) Signed: 04/09/2018 2:44:29 PM By: Montey Hora Entered By: Montey Hora on 04/09/2018 10:21:56 Tino, Deetra P. ([id number]) -------------------------------------------------------------------------------- Pain Assessment Details Patient Name: Mcnally, Tamasha P. Date of Service: 04/09/2018 10:00 AM Medical Record Number: [id number] Patient Account Number: [id number] Date of Birth/Sex: [birth year] (77 y.o. F) Treating RN: Army Melia Primary Care Anahli Arvanitis: Johny Drilling Other Clinician: Referring Norvel Wenker: Johny Drilling Treating Saagar Tortorella/Extender: Melburn Hake, HOYT Weeks in Treatment: 4 Active Problems Location of Pain Severity and Description of Pain Patient Has Paino No Site Locations Pain Management and Medication Current Pain Management: Electronic Signature(s) Signed: 04/09/2018 3:01:47 PM By: Lorine Bears RCP, RRT, CHT Signed: 04/10/2018 3:17:15 PM By: Army Melia Entered By: Lorine Bears on 04/09/2018 09:59:26 Christy, Kaeden P. ([id number]) -------------------------------------------------------------------------------- Patient/Caregiver Education Details Patient Name: Wenrich, Jolisa P. Date of Service: 04/09/2018 10:00 AM Medical Record Number: [id number] Patient Account Number: [id number] Date of Birth/Gender: 01-23-1941 (78 y.o. F) Treating RN: Army Melia Primary Care Physician: Johny Drilling Other Clinician: Referring Physician: Johny Drilling Treating Physician/Extender:  Sharalyn Ink in Treatment: 4 Education Assessment Education Provided To: Patient Education Topics Provided Wound/Skin Impairment: Handouts: Caring for Your Ulcer Methods: Demonstration, Explain/Verbal Responses: State content correctly Electronic Signature(s) Signed: 04/10/2018 3:17:15 PM By: Army Melia Entered By: Army Melia on 04/09/2018 10:45:43 Mesler, Mataya P. ([id number]) -------------------------------------------------------------------------------- Wound Assessment Details Patient Name: Cavell, Eisha P. Date of Service: 04/09/2018 10:00 AM Medical Record Number: [id number] Patient Account Number: [id number] Date of Birth/Sex: 30-Oct-1940 (77 y.o. F) Treating RN: Montey Hora Primary Care Sinahi Knights: Johny Drilling Other Clinician: Referring Ameliyah Sarno: Johny Drilling Treating Loran Auguste/Extender: Melburn Hake, HOYT Weeks in Treatment: 4 Wound Status Wound Number: 1 Primary Diabetic Wound/Ulcer of the Lower Extremity Etiology: Wound Location: Left Malleolus - Medial Secondary Venous Leg Ulcer Wounding Event: Gradually Appeared Etiology: Date Acquired: 12/07/2017 Wound Open Weeks Of Treatment: 4 Status: Clustered Wound: No Comorbid Cataracts, Angina, Coronary Artery Disease, History: Hypertension, Myocardial Infarction, Peripheral Venous Disease, Type II Diabetes, Osteoarthritis, Neuropathy, Received Chemotherapy, Received Radiation Photos Photo Uploaded By: Montey Hora on 04/09/2018 10:20:41 Wound Measurements Length: (cm) 0.5 Width: (cm) 0.4 Depth: (cm) 0.1 Area: (cm) 0.157 Volume: (cm) 0.016 % Reduction in Area: 75% % Reduction in Volume: 74.6% Epithelialization: None Tunneling: No Undermining: No Wound Description Classification: Grade 2 Wound Margin: Flat and Intact Exudate Amount: Small Exudate Type: Serous Exudate Color: amber Foul Odor After Cleansing: No Slough/Fibrino Yes Wound Bed Granulation Amount: None Present (0%) Exposed  Structure Necrotic Amount: Small (1-33%) Fascia Exposed: No Necrotic Quality: Eschar Fat Layer (Subcutaneous Tissue) Exposed: Yes Tendon Exposed: No Muscle Exposed: No Edelman, Cherrie P. ([id number]) Joint Exposed: No Bone Exposed: No Periwound Skin Texture Texture Color No Abnormalities Noted: No No Abnormalities Noted: No Callus: No Atrophie Blanche: No Crepitus: No Cyanosis: No Excoriation: No Ecchymosis: No Induration:  No Erythema: No Rash: No Hemosiderin Staining: No Scarring: No Mottled: No Pallor: No Moisture Rubor: No No Abnormalities Noted: No Dry / Scaly: No Temperature / Pain Maceration: No Temperature: No Abnormality Wound Preparation Ulcer Cleansing: Other: soap and water, Topical Anesthetic Applied: Other: lidocaine 4%, Electronic Signature(s) Signed: 04/09/2018 2:44:29 PM By: Montey Hora Entered By: Montey Hora on 04/09/2018 10:12:44 Piazza, Suann P. ([id number]) -------------------------------------------------------------------------------- Wound Assessment Details Patient Name: Porras, Sheanna P. Date of Service: 04/09/2018 10:00 AM Medical Record Number: [id number] Patient Account Number: [id number] Date of Birth/Sex: 1940/07/27 (77 y.o. F) Treating RN: Montey Hora Primary Care Sharbel Sahagun: Johny Drilling Other Clinician: Referring Alessia Gonsalez: Johny Drilling Treating Georgette Helmer/Extender: Melburn Hake, HOYT Weeks in Treatment: 4 Wound Status Wound Number: 3 Primary Diabetic Wound/Ulcer of the Lower Extremity Etiology: Wound Location: Left Lower Leg - Medial Wound Open Wounding Event: Gradually Appeared Status: Date Acquired: 01/29/2016 Comorbid Cataracts, Angina, Coronary Artery Disease, Weeks Of Treatment: 1 History: Hypertension, Myocardial Infarction, Peripheral Clustered Wound: No Venous Disease, Type II Diabetes, Osteoarthritis, Neuropathy, Received Chemotherapy, Received Radiation Photos Photo Uploaded By: Montey Hora on  04/09/2018 10:20:42 Wound Measurements Length: (cm) 0.1 Width: (cm) 0.1 Depth: (cm) 0.1 Area: (cm) 0.008 Volume: (cm) 0.001 % Reduction in Area: 97.5% % Reduction in Volume: 96.8% Epithelialization: Large (67-100%) Tunneling: No Undermining: No Wound Description Classification: Grade 1 Foul O Wound Margin: Flat and Intact Slough Exudate Amount: None Present dor After Cleansing: No /Fibrino No Wound Bed Granulation Amount: None Present (0%) Exposed Structure Necrotic Amount: None Present (0%) Fascia Exposed: No Fat Layer (Subcutaneous Tissue) Exposed: Yes Tendon Exposed: No Muscle Exposed: No Joint Exposed: No Bone Exposed: No Periwound Skin Texture Sahm, Searra P. ([id number]) Texture Color No Abnormalities Noted: No No Abnormalities Noted: No Erythema: Yes Moisture Erythema Location: Circumferential No Abnormalities Noted: No Temperature / Pain Temperature: No Abnormality Wound Preparation Ulcer Cleansing: Rinsed/Irrigated with Saline, Other: soap and water, Topical Anesthetic Applied: None Electronic Signature(s) Signed: 04/09/2018 2:44:29 PM By: Montey Hora Entered By: Montey Hora on 04/09/2018 10:13:44 Bumbaugh, Navi P. ([id number]) -------------------------------------------------------------------------------- Wound Assessment Details Patient Name: Fenstermacher, Keelan P. Date of Service: 04/09/2018 10:00 AM Medical Record Number: [id number] Patient Account Number: [id number] Date of Birth/Sex: 1940/04/20 (77 y.o. F) Treating RN: Montey Hora Primary Care Vollie Aaron: Johny Drilling Other Clinician: Referring Clive Parcel: Johny Drilling Treating Vaneza Pickart/Extender: Melburn Hake, HOYT Weeks in Treatment: 4 Wound Status Wound Number: 4 Primary Diabetic Wound/Ulcer of the Lower Extremity Etiology: Wound Location: Left Lower Leg - Lateral Wound Open Wounding Event: Gradually Appeared Status: Date Acquired: 03/29/2018 Comorbid Cataracts, Angina, Coronary  Artery Disease, Weeks Of Treatment: 1 History: Hypertension, Myocardial Infarction, Peripheral Clustered Wound: No Venous Disease, Type II Diabetes, Osteoarthritis, Neuropathy, Received Chemotherapy, Received Radiation Photos Photo Uploaded By: Montey Hora on 04/09/2018 10:20:57 Wound Measurements Length: (cm) 0.5 Width: (cm) 0.4 Depth: (cm) 0.1 Area: (cm) 0.157 Volume: (cm) 0.016 % Reduction in Area: 91.7% % Reduction in Volume: 91.5% Epithelialization: Medium (34-66%) Tunneling: No Undermining: No Wound Description Classification: Grade 2 Foul O Wound Margin: Flat and Intact Slough Exudate Amount: Small Exudate Type: Serous Exudate Color: amber dor After Cleansing: No /Fibrino No Wound Bed Granulation Amount: Large (67-100%) Exposed Structure Granulation Quality: Pink Fascia Exposed: No Necrotic Amount: Small (1-33%) Fat Layer (Subcutaneous Tissue) Exposed: Yes Necrotic Quality: Adherent Slough Tendon Exposed: No Muscle Exposed: No Joint Exposed: No Bone Exposed: No Gellerman, Meyer P. ([id number]) Periwound Skin Texture Texture Color No Abnormalities Noted: No No Abnormalities Noted: No Callus: No Atrophie Blanche: No Crepitus: No Cyanosis:  No Excoriation: No Ecchymosis: No Induration: No Erythema: Yes Rash: No Erythema Location: Circumferential Scarring: No Hemosiderin Staining: No Mottled: No Moisture Pallor: No No Abnormalities Noted: No Rubor: No Dry / Scaly: No Maceration: No Temperature / Pain Temperature: No Abnormality Wound Preparation Ulcer Cleansing: Other: soap and water, Topical Anesthetic Applied: Other: lidocaine 4%, Electronic Signature(s) Signed: 04/09/2018 2:44:29 PM By: Montey Hora Entered By: Montey Hora on 04/09/2018 10:16:06 Fortin, Christyne P. ([id number]) -------------------------------------------------------------------------------- Vitals Details Patient Name: Fout, Santa P. Date of Service:  04/09/2018 10:00 AM Medical Record Number: [id number] Patient Account Number: [id number] Date of Birth/Sex: 1940/02/04 (77 y.o. F) Treating RN: Army Melia Primary Care Dantae Meunier: Johny Drilling Other Clinician: Referring Magenta Schmiesing: Johny Drilling Treating Shonn Farruggia/Extender: Melburn Hake, HOYT Weeks in Treatment: 4 Vital Signs Time Taken: 09:59 Temperature (F): 97.6 Height (in): 67 Pulse (bpm): 66 Weight (lbs): 216 Respiratory Rate (breaths/min): 18 Body Mass Index (BMI): 33.8 Blood Pressure (mmHg): 139/62 Reference Range: 80 - 120 mg / dl Electronic Signature(s) Signed: 04/09/2018 3:01:47 PM By: Lorine Bears RCP, RRT, CHT Entered By: Lorine Bears on 04/09/2018 10:02:00

## 2018-04-11 NOTE — Progress Notes (Signed)
LEONA, ALEN ([id number]) Visit Report for 04/09/2018 Chief Complaint Document Details Patient Name: Theresa [name], Theresa [name]. Date of Service: 04/09/2018 10:00 AM Medical Record Number: [id number] Patient Account Number: [id number] Date of Birth/Sex: Jun 03, 1940 (78 y.o. F) Treating RN: Theresa [name] Primary Care Provider: Johny [name] Other Clinician: Referring Provider: Johny [name] Treating Provider/Extender: Theresa [name], Theresa [name] in Treatment: 4 Information Obtained from: Patient Chief Complaint Bilateral LE ulcers Electronic Signature(s) Signed: 04/09/2018 3:26:18 PM By: Worthy Keeler [name] Entered By: Worthy Keeler on 04/09/2018 10:07:42 [name], [name] ([id number]) -------------------------------------------------------------------------------- Debridement Details Patient Name: [name], [name] Date of Service: 04/09/2018 10:00 AM Medical Record Number: [id number] Patient Account Number: [id number] Date of Birth/Sex: 10/20/1940 (78 y.o. F) Treating RN: Theresa [name] Primary Care Provider: Johny [name] Other Clinician: Referring Provider: Johny [name] Treating Provider/Extender: Theresa [name], Theresa [name] [name] in Treatment: 4 Debridement Performed for Wound #1 Left,Medial Malleolus Assessment: Performed By: Physician Theresa [name] Debridement Type: Debridement Severity of Tissue Pre Fat layer exposed Debridement: Level of Consciousness (Pre- Awake and Alert procedure): Pre-procedure Verification/Time Yes - 10:35 Out Taken: Start Time: 10:35 Pain Control: Lidocaine Total Area Debrided (L x W): 0.5 (cm) x 0.4 (cm) = 0.2 (cm) Tissue and other material Non-Viable, Skin: Epidermis debrided: Level: Skin/Epidermis Debridement Description: Selective/Open Wound Instrument: Curette Bleeding: None End Time: 10:37 Response to Treatment: Procedure was tolerated well Level of Consciousness Awake and Alert (Post-procedure): Post Debridement Measurements of Total  Wound Length: (cm) 0.5 Width: (cm) 0.4 Depth: (cm) 0.1 Volume: (cm) 0.016 Character of Wound/Ulcer Post Debridement: Stable Severity of Tissue Post Debridement: Fat layer exposed Post Procedure Diagnosis Same as Pre-procedure Electronic Signature(s) Signed: 04/09/2018 3:26:18 PM By: Worthy Keeler [name] Signed: 04/10/2018 3:17:15 PM By: Theresa [name] Entered By: Theresa [name] on 04/09/2018 10:40:10 [name], [name] ([id number]) -------------------------------------------------------------------------------- HPI Details Patient Name: [name], [name] Date of Service: 04/09/2018 10:00 AM Medical Record Number: [id number] Patient Account Number: [id number] Date of Birth/Sex: 1940/02/14 (78 y.o. F) Treating RN: Theresa [name] Primary Care Provider: Johny [name] Other Clinician: Referring Provider: Johny [name] Treating Provider/Extender: Theresa [name], Theresa [name] [name] in Treatment: 4 History of Present Illness HPI Description: 03/09/18 on evaluation today patient presents for initial inspection in our clinic concerning issues that she has been having with bilateral lower extremity ulcers. She has lymphedema as well as chronic venous stasis. She has seen Dr. dew where she's undergone ABI testing with a left ABI 1.28 a right ABI 1.22. Subsequently she is supposed to be undergoing venous studies as well which are upcoming. She does have a history of diabetes, chronic venous stasis, peripheral vascular disease, and hypertension. Currently these wounds which are currently draining are stated to have been open for roughly 2 [name] or so. It does not appear to be any evidence of infection. No fevers, chills, nausea, or vomiting noted at this time. With that being said the patient does have some prominence to the ankle medially especially on the left side and I'm not sure if something was rubbing which calls this issue either. She is unable to get around and move very well simply due to the fact that she  has an issue with her right hip as well which unfortunately prevents her from being able to move around very much. She did have an [place] placed at the request of her daughter when she was at vascular this seems to may be potentially helped a little bit. 03/16/18 on evaluation today patient's right lower extremity appears to  likely be completely healed although there still small area of concern which I'm gonna continue to watch. The really does not appear to be any weeping at this point which is good news. Overall I feel like she is done excellent. In regard to left lower extremity this is a little larger still than the right although again the swelling is indeed down. The wound over the media malleolus does appear to be doing better which is good news. No fevers, chills, nausea, or vomiting noted at this time. Unfortunately the patient still continues to have right hip pain which is getting more severe. Again her surgeon did not want to undergo surgery until everything was healed as far as the wounds of the right lower extremity. 03/23/18 on evaluation today patient actually appears to be doing excellent in regard to her right lower extremity. There are no open wounds at this point in time and there is no weeping of drainage the compression wraps appear to have done very well for her. She did get the FarrowWrap 4000 Velcro compression wraps which we are gonna initiate treatment with today and this should help continue to keep swelling under control as well is the weeping. Again everything appears to be doing great in regard to the right lower extremity. In regard to left lower extremity everything is pretty much close there are no significant open wounds the only actual open wound that she has is on the medial left ankle which again is the opposite side from where her hip issue is and this is very superficial and shows no signs of infection at this point. 04/02/18 on evaluation today patient  actually presents for an early appointment after I saw her last week due to several areas the left lower extremity which had reopened causing some issues at this point. Fortunately there does not appear to be any evidence of infection at this time. With that being said she again is waiting on everything to be better in order to be able to proceed with the surgery for her right hip as previously suggested. 04/09/18 on evaluation today patient actually appears to be doing better in regard to her left lower surety ulcers which is great news. Unfortunately she continues to have issues even on the right side there are no open wounds that she does have a blister that arose on the right at this point as well. Fortunately there's no evidence of infection currently. No fevers, chills, nausea, or vomiting noted at this time. Electronic Signature(s) Signed: 04/09/2018 3:26:18 PM By: Worthy Keeler [name] Entered By: Worthy Keeler on 04/09/2018 14:40:53 [name], Theresa Dow ([id number]) -------------------------------------------------------------------------------- Physical Exam Details Patient Name: [name], [name] Date of Service: 04/09/2018 10:00 AM Medical Record Number: [id number] Patient Account Number: [id number] Date of Birth/Sex: 02-03-1940 (78 y.o. F) Treating RN: Theresa [name] Primary Care Provider: Johny [name] Other Clinician: Referring Provider: Johny [name] Treating Provider/Extender: Theresa [name], Mariska Daffin [name] in Treatment: 4 Constitutional Well-nourished and well-hydrated in no acute distress. Respiratory normal breathing without difficulty. clear to auscultation bilaterally. Cardiovascular regular rate and rhythm with normal S1, S2. 1+ pitting edema of the bilateral lower extremities. Psychiatric this patient is able to make decisions and demonstrates good insight into disease process. Alert and Oriented x 3. pleasant and cooperative. Notes Upon inspection today patient's wound bed  actually shows evidence of good granulation currently. Fortunately there's no signs of infection at this time. Overall been very pleased with how things seem to be progressing. With that being said  I do think that this patient may be a good candidate for lymphedema pumps based on what I'm seeing currently. She does have stage one lymphedema we have been wrapping her with compression since March 09, 2018. She does well with compression. I think that lymphedema pumps would be good benefit for her as well. Electronic Signature(s) Signed: 04/09/2018 3:26:18 PM By: Worthy Keeler [name] Entered By: Worthy Keeler on 04/09/2018 14:42:47 [name], Theresa Dow ([id number]) -------------------------------------------------------------------------------- Physician Orders Details Patient Name: [name], [name] Date of Service: 04/09/2018 10:00 AM Medical Record Number: [id number] Patient Account Number: [id number] Date of Birth/Sex: 1940/09/05 (78 y.o. F) Treating RN: Theresa [name] Primary Care Provider: Johny [name] Other Clinician: Referring Provider: Johny [name] Treating Provider/Extender: Theresa [name], Zahid Carneiro [name] in Treatment: 4 Verbal / Phone Orders: No Diagnosis Coding ICD-10 Coding Code Description E11.622 Type 2 diabetes mellitus with other skin ulcer I87.2 Venous insufficiency (chronic) (peripheral) L97.322 Non-pressure chronic ulcer of left ankle with fat layer exposed L97.812 Non-pressure chronic ulcer of other part of right lower leg with fat layer exposed I73.89 Other specified peripheral vascular diseases I10 Essential (primary) hypertension Wound Cleansing Wound #1 Left,Medial Malleolus o Clean wound with Normal Saline. o Cleanse wound with mild soap and water o May Shower, gently pat wound dry prior to applying new dressing. Wound #3 Left,Medial Lower Leg o Clean wound with Normal Saline. o Cleanse wound with mild soap and water o May Shower, gently pat wound dry  prior to applying new dressing. Wound #4 Left,Lateral Lower Leg o Clean wound with Normal Saline. o Cleanse wound with mild soap and water o May Shower, gently pat wound dry prior to applying new dressing. Anesthetic (add to Medication List) Wound #1 Left,Medial Malleolus o Topical Lidocaine 4% cream applied to wound bed prior to debridement (In Clinic Only). Wound #3 Left,Medial Lower Leg o Topical Lidocaine 4% cream applied to wound bed prior to debridement (In Clinic Only). Wound #4 Left,Lateral Lower Leg o Topical Lidocaine 4% cream applied to wound bed prior to debridement (In Clinic Only). Primary Wound Dressing Wound #1 Left,Medial Malleolus o Silver Alginate Wound #3 Left,Medial Lower Leg o Silver Alginate Wound #4 Left,Lateral Lower Leg Dirusso, Naida P. ([id number]) o Silver Alginate Secondary Dressing Wound #1 Left,Medial Malleolus o ABD pad Wound #3 Left,Medial Lower Leg o ABD pad Wound #4 Left,Lateral Lower Leg o ABD pad Dressing Change Frequency Wound #1 Left,Medial Malleolus o Change dressing every week Wound #3 Left,Medial Lower Leg o Change dressing every week Wound #4 Left,Lateral Lower Leg o Change dressing every week Follow-up Appointments Wound #1 Left,Medial Malleolus o Return Appointment in 1 week. Wound #3 Left,Medial Lower Leg o Return Appointment in 1 week. Wound #4 Left,Lateral Lower Leg o Return Appointment in 1 week. Edema Control Wound #1 Left,Medial Malleolus o 3 Layer Compression System - Bilateral - TCA ointment Wound #3 Left,Medial Lower Leg o 3 Layer Compression System - Bilateral - TCA ointment Wound #4 Left,Lateral Lower Leg o 3 Layer Compression System - Bilateral - TCA ointment Additional Orders / Instructions Wound #1 Left,Medial Malleolus o Increase protein intake. Wound #3 Left,Medial Lower Leg o Increase protein intake. Wound #4 Left,Lateral Lower Leg o Increase protein  intake. Electronic Signature(s) EMMAJEAN, RATLEDGE ([id number]) Signed: 04/09/2018 3:26:18 PM By: Worthy Keeler [name] Signed: 04/10/2018 3:17:15 PM By: Theresa [name] Entered By: Theresa [name] on 04/09/2018 10:42:17 Wernert, Austine P. ([id number]) -------------------------------------------------------------------------------- Problem List Details Patient Name: [name], [name] Date of Service: 04/09/2018 10:00 AM Medical Record  Number: [id number] Patient Account Number: [id number] Date of Birth/Sex: [birth year] (78 y.o. F) Treating RN: Theresa [name] Primary Care Provider: Johny [name] Other Clinician: Referring Provider: Johny [name] Treating Provider/Extender: Theresa [name], Melayna Robarts [name] in Treatment: 4 Active Problems ICD-10 Evaluated Encounter Code Description Active Date Today Diagnosis E11.622 Type 2 diabetes mellitus with other skin ulcer 03/09/2018 No Yes I87.2 Venous insufficiency (chronic) (peripheral) 03/09/2018 No Yes L97.322 Non-pressure chronic ulcer of left ankle with fat layer 03/09/2018 No Yes exposed L97.812 Non-pressure chronic ulcer of other part of right lower leg 03/09/2018 No Yes with fat layer exposed I73.89 Other specified peripheral vascular diseases 03/09/2018 No Yes I10 Essential (primary) hypertension 03/09/2018 No Yes Inactive Problems Resolved Problems Electronic Signature(s) Signed: 04/09/2018 3:26:18 PM By: Worthy Keeler [name] Entered By: Worthy Keeler on 04/09/2018 10:07:36 Dahlem, Tamaiya P. ([id number]) -------------------------------------------------------------------------------- Progress Note Details Patient Name: [name], [name] Date of Service: 04/09/2018 10:00 AM Medical Record Number: [id number] Patient Account Number: [id number] Date of Birth/Sex: 31-Jan-1940 (78 y.o. F) Treating RN: Theresa [name] Primary Care Provider: Johny [name] Other Clinician: Referring Provider: Johny [name] Treating Provider/Extender: Theresa [name], Coralie Stanke [name] in  Treatment: 4 Subjective Chief Complaint Information obtained from Patient Bilateral LE ulcers History of Present Illness (HPI) 03/09/18 on evaluation today patient presents for initial inspection in our clinic concerning issues that she has been having with bilateral lower extremity ulcers. She has lymphedema as well as chronic venous stasis. She has seen Dr. dew where she's undergone ABI testing with a left ABI 1.28 a right ABI 1.22. Subsequently she is supposed to be undergoing venous studies as well which are upcoming. She does have a history of diabetes, chronic venous stasis, peripheral vascular disease, and hypertension. Currently these wounds which are currently draining are stated to have been open for roughly 2 [name] or so. It does not appear to be any evidence of infection. No fevers, chills, nausea, or vomiting noted at this time. With that being said the patient does have some prominence to the ankle medially especially on the left side and I'm not sure if something was rubbing which calls this issue either. She is unable to get around and move very well simply due to the fact that she has an issue with her right hip as well which unfortunately prevents her from being able to move around very much. She did have an [place] placed at the request of her daughter when she was at vascular this seems to may be potentially helped a little bit. 03/16/18 on evaluation today patient's right lower extremity appears to likely be completely healed although there still small area of concern which I'm gonna continue to watch. The really does not appear to be any weeping at this point which is good news. Overall I feel like she is done excellent. In regard to left lower extremity this is a little larger still than the right although again the swelling is indeed down. The wound over the media malleolus does appear to be doing better which is good news. No fevers, chills, nausea, or vomiting noted at  this time. Unfortunately the patient still continues to have right hip pain which is getting more severe. Again her surgeon did not want to undergo surgery until everything was healed as far as the wounds of the right lower extremity. 03/23/18 on evaluation today patient actually appears to be doing excellent in regard to her right lower extremity. There are no open wounds at this point in time and there is  no weeping of drainage the compression wraps appear to have done very well for her. She did get the FarrowWrap 4000 Velcro compression wraps which we are gonna initiate treatment with today and this should help continue to keep swelling under control as well is the weeping. Again everything appears to be doing great in regard to the right lower extremity. In regard to left lower extremity everything is pretty much close there are no significant open wounds the only actual open wound that she has is on the medial left ankle which again is the opposite side from where her hip issue is and this is very superficial and shows no signs of infection at this point. 04/02/18 on evaluation today patient actually presents for an early appointment after I saw her last week due to several areas the left lower extremity which had reopened causing some issues at this point. Fortunately there does not appear to be any evidence of infection at this time. With that being said she again is waiting on everything to be better in order to be able to proceed with the surgery for her right hip as previously suggested. 04/09/18 on evaluation today patient actually appears to be doing better in regard to her left lower surety ulcers which is great news. Unfortunately she continues to have issues even on the right side there are no open wounds that she does have a blister that arose on the right at this point as well. Fortunately there's no evidence of infection currently. No fevers, chills, nausea, or vomiting noted at this  time. Patient History Information obtained from Patient. TAJI, SATHER ([id number]) Social History Never smoker, Marital Status - Married, Alcohol Use - Never, Drug Use - No History, Caffeine Use - Never. Medical History Eyes Patient has history of Cataracts - removed Denies history of Glaucoma, Optic Neuritis Ear/Nose/Mouth/Throat Denies history of Chronic sinus problems/congestion, Middle ear problems Hematologic/Lymphatic Denies history of Anemia, Hemophilia, Human Immunodeficiency Virus, Lymphedema, Sickle Cell Disease Respiratory Denies history of Aspiration, Asthma, Chronic Obstructive Pulmonary Disease (COPD), Pneumothorax, Sleep Apnea, Tuberculosis Cardiovascular Patient has history of Angina, Coronary Artery Disease, Hypertension, Myocardial Infarction - more than 5 years ago, Peripheral Venous Disease Denies history of Arrhythmia, Congestive Heart Failure, Deep Vein Thrombosis, Hypotension, Peripheral Arterial Disease, Phlebitis, Vasculitis Gastrointestinal Denies history of Cirrhosis , Colitis, Crohn s, Hepatitis A, Hepatitis B, Hepatitis C Endocrine Patient has history of Type II Diabetes Denies history of Type I Diabetes Genitourinary Denies history of End Stage Renal Disease Immunological Denies history of Lupus Erythematosus, Raynaud s, Scleroderma Integumentary (Skin) Denies history of History of Burn, History of pressure wounds Musculoskeletal Patient has history of Osteoarthritis Denies history of Gout, Rheumatoid Arthritis, Osteomyelitis Neurologic Patient has history of Neuropathy Denies history of Dementia, Quadriplegia, Paraplegia, Seizure Disorder Oncologic Patient has history of Received Chemotherapy, Received Radiation Psychiatric Denies history of Anorexia/bulimia, Confinement Anxiety Medical And Surgical History Notes Genitourinary CKD stage 3 Oncologic 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 Review of Systems  (ROS) Constitutional Symptoms (General Health) Denies complaints or symptoms of Fever, Chills. Respiratory The patient has no complaints or symptoms. Cardiovascular Complains or has symptoms of LE edema. Psychiatric The patient has no complaints or symptoms. [name], G[place] [name] ([id number]) Objective Constitutional Well-nourished and well-hydrated in no acute distress. Vitals Time Taken: 9:59 AM, Height: 67 in, Weight: 216 lbs, BMI: 33.8, Temperature: 97.6 F, Pulse: 66 bpm, Respiratory Rate: 18 breaths/min, Blood Pressure: 139/62 mmHg. Respiratory normal breathing without difficulty. clear to auscultation  bilaterally. Cardiovascular regular rate and rhythm with normal S1, S2. 1+ pitting edema of the bilateral lower extremities. Psychiatric this patient is able to make decisions and demonstrates good insight into disease process. Alert and Oriented x 3. pleasant and cooperative. General Notes: Upon inspection today patient's wound bed actually shows evidence of good granulation currently. Fortunately there's no signs of infection at this time. Overall been very pleased with how things seem to be progressing. With that being said I do think that this patient may be a good candidate for lymphedema pumps based on what I'm seeing currently. She does have stage one lymphedema we have been wrapping her with compression since March 09, 2018. She does well with compression. I think that lymphedema pumps would be good benefit for her as well. Integumentary (Hair, Skin) Wound #1 status is Open. Original cause of wound was Gradually Appeared. The wound is located on the Left,Medial Malleolus. The wound measures 0.5cm length x 0.4cm width x 0.1cm depth; 0.157cm^2 area and 0.016cm^3 volume. There is Fat Layer (Subcutaneous Tissue) Exposed exposed. There is no tunneling or undermining noted. There is a small amount of serous drainage noted. The wound margin is flat and intact. There is no  granulation within the wound bed. There is a small (1-33%) amount of necrotic tissue within the wound bed including Eschar. The periwound skin appearance did not exhibit: Callus, Crepitus, Excoriation, Induration, Rash, Scarring, Dry/Scaly, Maceration, Atrophie Blanche, Cyanosis, Ecchymosis, Hemosiderin Staining, Mottled, Pallor, Rubor, Erythema. Periwound temperature was noted as No Abnormality. Wound #3 status is Open. Original cause of wound was Gradually Appeared. The wound is located on the Left,Medial Lower Leg. The wound measures 0.1cm length x 0.1cm width x 0.1cm depth; 0.008cm^2 area and 0.001cm^3 volume. There is Fat Layer (Subcutaneous Tissue) Exposed exposed. There is no tunneling or undermining noted. There is a none present amount of drainage noted. The wound margin is flat and intact. There is no granulation within the wound bed. There is no necrotic tissue within the wound bed. The periwound skin appearance exhibited: Erythema. The surrounding wound skin color is noted with erythema which is circumferential. Periwound temperature was noted as No Abnormality. Wound #4 status is Open. Original cause of wound was Gradually Appeared. The wound is located on the Left,Lateral Lower Leg. The wound measures 0.5cm length x 0.4cm width x 0.1cm depth; 0.157cm^2 area and 0.016cm^3 volume. There is Fat Layer (Subcutaneous Tissue) Exposed exposed. There is no tunneling or undermining noted. There is a small amount of serous drainage noted. The wound margin is flat and intact. There is large (67-100%) pink granulation within the wound bed. There is a small (1-33%) amount of necrotic tissue within the wound bed including Adherent Slough. The periwound skin appearance exhibited: Erythema. The periwound skin appearance did not exhibit: Callus, Crepitus, Excoriation, Induration, Rash, Scarring, Dry/Scaly, Maceration, Atrophie Blanche, Cyanosis, Ecchymosis, Hemosiderin Staining, Mottled, Pallor, Rubor.  The surrounding wound skin color is noted with erythema which is circumferential. Periwound temperature was noted as No Abnormality. Other Condition(s) Stitt, [name] ([id number]) Patient presents with Other Dermatologic Condition located on the Right Leg. Skin temperature was noted as No Abnormality. 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 There was a Selective/Open Wound Skin/Epidermis Debridement with a total area of 0.2 sq cm performed by Theresa III, Keynan Heffern E., [name]. With the following instrument(s): Curette to remove Non-Viable tissue/material. Material removed includes Skin: Epidermis after achieving pain control using Lidocaine. A time out was conducted at 10:35, prior to the start of the procedure. There was no bleeding. The procedure was tolerated well. Post Debridement Measurements: 0.5cm length x 0.4cm width x 0.1cm depth; 0.016cm^3 volume. Character of Wound/Ulcer Post Debridement is stable. Severity of Tissue Post Debridement is: Fat layer exposed. Post procedure Diagnosis Wound #1: Same as Pre-Procedure Plan Wound Cleansing: Wound #1 Left,Medial Malleolus: Clean wound with Normal Saline. Cleanse wound with mild soap and water May Shower, gently pat wound dry prior to applying new dressing. Wound #3 Left,Medial Lower Leg: Clean wound with Normal Saline. Cleanse wound with mild soap and water May Shower, gently pat  wound dry prior to applying new dressing. Wound #4 Left,Lateral Lower Leg: Clean wound with Normal Saline. Cleanse wound with mild soap and water May Shower, gently pat wound dry prior to applying new dressing. Anesthetic (add to Medication List): Wound #1 Left,Medial Malleolus: Topical Lidocaine 4% cream applied to wound bed prior to debridement (In Clinic Only). MARISUE, CANION. ([id number]) Wound #3 Left,Medial Lower Leg: Topical Lidocaine 4% cream applied to wound bed prior to debridement (In Clinic Only). Wound #4 Left,Lateral Lower Leg: Topical Lidocaine 4% cream applied to wound bed prior to debridement (In Clinic Only). Primary Wound Dressing: Wound #1 Left,Medial Malleolus: Silver Alginate Wound #3 Left,Medial Lower Leg: Silver Alginate Wound #4 Left,Lateral Lower Leg: Silver Alginate Secondary Dressing: Wound #1 Left,Medial Malleolus: ABD pad Wound #3 Left,Medial Lower Leg: ABD pad Wound #4 Left,Lateral Lower Leg: ABD pad Dressing Change Frequency: Wound #1 Left,Medial Malleolus: Change dressing every week Wound #3 Left,Medial Lower Leg: Change dressing every week Wound #4 Left,Lateral Lower Leg: Change dressing every week Follow-up Appointments: Wound #1 Left,Medial Malleolus: Return Appointment in 1 week. Wound #3 Left,Medial Lower Leg: Return Appointment in 1 week. Wound #4 Left,Lateral Lower Leg: Return Appointment in 1 week. Edema Control: Wound #1 Left,Medial Malleolus: 3 Layer Compression System - Bilateral - TCA ointment Wound #3 Left,Medial Lower Leg: 3 Layer Compression System - Bilateral - TCA ointment Wound #4 Left,Lateral Lower Leg: 3 Layer Compression System - Bilateral - TCA ointment Additional Orders / Instructions: Wound #1 Left,Medial Malleolus: Increase protein intake. Wound #3 Left,Medial Lower Leg: Increase protein intake. Wound #4 Left,Lateral Lower Leg: Increase protein intake. My suggestion currently is gonna be that we go  ahead and continue with the above wound care measures and gonna rewrap her right leg today as well in order to help with this bluster and prevent any new reoccurrence. Again we're trying to get her to the point that she can have her hip replacement surgery and get things doing better so she's actually able to move around. 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 She also has been using her home Velcro compression wraps in between times with rapture. Nonetheless I still think she would benefit from the lymphedema pumps them are gonna go ahead and see about ordering that for her today. If anything changes or worsens she let me know otherwise will see were things stand in one  week. Please see above for specific wound care orders. We will see patient for re-evaluation in 1 week(s) here in the clinic. If anything worsens or changes patient will contact our office for additional recommendations. BERKLIE, DETHLEFS P. ([id number]) in regard to her right hip replacement I think she is at very low risk at this point I see no signs of infection almost all of her wounds are completely close I expect they would be next week. I plan to see her back at that time we will see were things stand however if surgeon needs any additional documentation they can definitely contact her office and did have the patient take pictures of the wounds today to show him hopefully this will be sufficient to go ahead and get the patient the surgery schedule we can continue to wrap her until surgery if that's the case in order to keep the blistering under control although if we get the pumps I think this would be helpful as well. Electronic Signature(s) Signed: 04/09/2018 3:26:18 PM By: Worthy Keeler [name] Entered By: Worthy Keeler on 04/09/2018 14:44:00 Facemire, Edgar P.  ([id number]) -------------------------------------------------------------------------------- ROS/PFSH Details Patient Name: Hocevar, Triana P. Date of Service: 04/09/2018 10:00 AM Medical Record Number: [id number] Patient Account Number: [id number] Date of Birth/Sex: Jul 29, 1940 (78 y.o. F) Treating RN: Theresa [name] Primary Care Provider: Johny [name] Other Clinician: Referring Provider: Johny [name] Treating Provider/Extender: Theresa [name], Clarkson Rosselli [name] in Treatment: 4 Information Obtained From Patient Wound History Do you currently have one or more open woundso Yes How many open wounds do you currently haveo 3 Approximately how long have you had your woundso months How have you been treating your wound(s) until nowo unna boots Has your wound(s) ever healed and then re-openedo No Have you had any lab work done in the past montho No Have you tested positive for an antibiotic resistant organism (MRSA, VRE)o No Have you tested positive for osteomyelitis (bone infection)o No Have you had any tests for circulation on your legso No Have you had other problems associated with your woundso Swelling Constitutional Symptoms (General Health) Complaints and Symptoms: Negative for: Fever; Chills Cardiovascular Complaints and Symptoms: Positive for: LE edema Medical History: Positive for: Angina; Coronary Artery Disease; Hypertension; Myocardial Infarction - more than 5 years ago; Peripheral Venous Disease Negative for: Arrhythmia; Congestive Heart Failure; Deep Vein Thrombosis; Hypotension; Peripheral Arterial Disease; Phlebitis; Vasculitis Eyes Medical History: Positive for: Cataracts - removed Negative for: Glaucoma; Optic Neuritis Ear/Nose/Mouth/Throat Medical History: Negative for: Chronic sinus problems/congestion; Middle ear problems Hematologic/Lymphatic Medical History: Negative for: Anemia; Hemophilia; Human Immunodeficiency Virus; Lymphedema; Sickle Cell  Disease Respiratory Sundby, Paolina P. ([id number]) Complaints and Symptoms: No Complaints or Symptoms Medical History: Negative for: Aspiration; Asthma; Chronic Obstructive Pulmonary Disease (COPD); Pneumothorax; Sleep Apnea; Tuberculosis Gastrointestinal Medical History: Negative for: Cirrhosis ; Colitis; Crohnos; Hepatitis A; Hepatitis B; Hepatitis C Endocrine Medical History: Positive for: Type II Diabetes Negative for: Type I Diabetes Genitourinary Medical History: Negative for: End Stage Renal Disease Past Medical History Notes: CKD stage 3 Immunological Medical History: Negative for: Lupus Erythematosus; Raynaudos; Scleroderma Integumentary (Skin) Medical History: Negative for: History of Burn; History of pressure wounds Musculoskeletal Medical History: Positive for: Osteoarthritis Negative for: Gout; Rheumatoid Arthritis; Osteomyelitis Neurologic Medical History: Positive for: Neuropathy Negative for: Dementia; Quadriplegia; Paraplegia; Seizure Disorder Oncologic Medical History: Positive for: Received Chemotherapy; Received Radiation Past Medical History Notes: 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 Psychiatric Complaints and Symptoms: No Complaints or Symptoms Carriker, Arrie P. ([id number])  Medical History: Negative for: Anorexia/bulimia; Confinement Anxiety HBO Extended History Items Eyes: Cataracts Immunizations Pneumococcal Vaccine: Received Pneumococcal Vaccination: Yes Implantable Devices No devices added Family and Social History Never smoker; Marital Status - Married; Alcohol Use: Never; Drug Use: No History; Caffeine Use: Never; Financial Concerns: No; Food, Clothing or Shelter Needs: No; Support System Lacking: No; Transportation Concerns: No; Advanced Directives: No; Patient does not want information on Advanced Directives Physician Affirmation I have reviewed and agree with the above information. Electronic  Signature(s) Signed: 04/09/2018 3:26:18 PM By: Worthy Keeler [name] Signed: 04/10/2018 3:17:15 PM By: Theresa [name] Entered By: Worthy Keeler on 04/09/2018 14:42:23 Demario, Chenoa P. ([id number]) -------------------------------------------------------------------------------- SuperBill Details Patient Name: Cozby, Gabrial P. Date of Service: 04/09/2018 Medical Record Number: [id number] Patient Account Number: [id number] Date of Birth/Sex: 11-24-1940 (78 y.o. 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 I73.89 Other specified peripheral vascular diseases I10 Essential (primary) hypertension Facility Procedures CPT4 Code: [id number] Description: [phone] - [facility]BR[facility] WOUND 1ST 20 SQ CM OR < ICD-10 Diagnosis Description L97.322 Non-pressure chronic ulcer of left ankle with fat layer exposed Modifier: Quantity: 1 Physician Procedures CPT4 Code Description: [id number] - WC PHYS LEVEL 4 - EST PT ICD-10 Diagnosis Description E11.622 Type 2 diabetes mellitus with other skin ulcer I87.2 Venous insufficiency (chronic) (peripheral) L97.322 Non-pressure chronic ulcer of left ankle with  fat layer exposed L97.812 Non-pressure chronic ulcer of other part of right lower leg with f Modifier: 25 at layer expo Quantity: 1 sed CPT4 Code Description: [id number] - WC PHYS DEBR WO ANESTH 20 SQ CM ICD-10 Diagnosis Description Z99.357 Non-pressure chronic ulcer of left ankle with fat layer exposed Modifier: Quantity: 1 Electronic Signature(s) Signed: 04/09/2018  3:26:18 PM By: Worthy Keeler [name] Entered By: Worthy Keeler on 04/09/2018 14:45:14

## 2018-04-16 ENCOUNTER — Other Ambulatory Visit: Payer: Self-pay

## 2018-04-16 ENCOUNTER — Encounter: Payer: Medicare HMO | Admitting: Physician Assistant

## 2018-04-16 DIAGNOSIS — E11622 Type 2 diabetes mellitus with other skin ulcer: Secondary | ICD-10-CM | POA: Diagnosis not present

## 2018-04-17 NOTE — Progress Notes (Signed)
Theresa, [name] ([id number]) Visit Report for 04/16/2018 Arrival Information Details Patient Name: Theresa [name], Theresa [name]. Date of Service: 04/16/2018 3:30 PM Medical Record Number: [id number] Patient Account Number: [id number] Date of Birth/Sex: 1941/01/10 (78 y.o. F) Treating RN: Montey Hora Primary Care Markham Dumlao: Johny Drilling Other Clinician: Referring Dioselina Brumbaugh: Johny Drilling Treating Denine Brotz/Extender: Melburn Hake, HOYT Weeks in Treatment: 5 Visit Information History Since Last Visit Added or deleted any medications: No Patient Arrived: Wheel Chair Any new allergies or adverse reactions: No Arrival Time: 15:57 Had a fall or experienced change in No Accompanied By: self activities of daily living that may affect Transfer Assistance: None risk of falls: Patient Identification Verified: Yes Signs or symptoms of abuse/neglect since last visito No Secondary Verification Process Yes Hospitalized since last visit: No Completed: Implantable device outside of the clinic excluding No Patient Has Alerts: Yes cellular tissue based products placed in the center Patient Alerts: Patient on Blood since last visit: Thinner Has Dressing in Place as Prescribed: Yes Plavix, 81mg  aspirin Has Compression in Place as Prescribed: Yes Type II Diabetic 03/06/2018 ABI AVVS Pain Present Now: No (L) 1.28 (R) 1.22 Electronic Signature(s) Signed: 04/16/2018 4:49:35 PM By: Montey Hora Entered By: Montey Hora on 04/16/2018 15:58:02 [place], [place]. ([id number]) -------------------------------------------------------------------------------- Clinic Level of Care Assessment Details Patient Name: [name], [name] Date of Service: 04/16/2018 3:30 PM Medical Record Number: [id number] Patient Account Number: [id number] Date of Birth/Sex: [birth year] (78 y.o. F) Treating RN: Army Melia Primary Care Erisha Paugh: Johny Drilling Other Clinician: Referring Detric Scalisi: Johny Drilling Treating Kimari Coudriet/Extender:  Melburn Hake, HOYT Weeks in Treatment: 5 Clinic Level of Care Assessment Items TOOL 4 Quantity Score []  - Use when only an EandM is performed on FOLLOW-UP visit 0 ASSESSMENTS - Nursing Assessment / Reassessment X - Reassessment of Co-morbidities (includes updates in patient status) 1 10 X- 1 5 Reassessment of Adherence to Treatment Plan ASSESSMENTS - Wound and Skin Assessment / Reassessment X - Simple Wound Assessment / Reassessment - one wound 1 5 []  - 0 Complex Wound Assessment / Reassessment - multiple wounds []  - 0 Dermatologic / Skin Assessment (not related to wound area) ASSESSMENTS - Focused Assessment []  - Circumferential Edema Measurements - multi extremities 0 []  - 0 Nutritional Assessment / Counseling / Intervention []  - 0 Lower Extremity Assessment (monofilament, tuning fork, pulses) []  - 0 Peripheral Arterial Disease Assessment (using hand held doppler) ASSESSMENTS - Ostomy and/or Continence Assessment and Care []  - Incontinence Assessment and Management 0 []  - 0 Ostomy Care Assessment and Management (repouching, etc.) PROCESS - Coordination of Care X - Simple Patient / Family Education for ongoing care 1 15 []  - 0 Complex (extensive) Patient / Family Education for ongoing care []  - 0 Staff obtains Programmer, systems, Records, Test Results / Process Orders []  - 0 Staff telephones HHA, Nursing Homes / Clarify orders / etc []  - 0 Routine Transfer to another Facility (non-emergent condition) []  - 0 Routine Hospital Admission (non-emergent condition) []  - 0 New Admissions / Biomedical engineer / Ordering NPWT, Apligraf, etc. []  - 0 Emergency Hospital Admission (emergent condition) X- 1 10 Simple Discharge Coordination [place], Edlyn P. ([id number]) []  - 0 Complex (extensive) Discharge Coordination PROCESS - Special Needs []  - Pediatric / Minor Patient Management 0 []  - 0 Isolation Patient Management []  - 0 Hearing / Language / Visual special needs []  -  0 Assessment of Community assistance (transportation, D/C planning, etc.) []  - 0 Additional assistance / Altered mentation []  - 0 Support Surface(s) Assessment (bed, cushion, seat,  etc.) INTERVENTIONS - Wound Cleansing / Measurement X - Simple Wound Cleansing - one wound 1 5 []  - 0 Complex Wound Cleansing - multiple wounds X- 1 5 Wound Imaging (photographs - any number of wounds) []  - 0 Wound Tracing (instead of photographs) X- 1 5 Simple Wound Measurement - one wound []  - 0 Complex Wound Measurement - multiple wounds INTERVENTIONS - Wound Dressings []  - Small Wound Dressing one or multiple wounds 0 X- 1 15 Medium Wound Dressing one or multiple wounds []  - 0 Large Wound Dressing one or multiple wounds []  - 0 Application of Medications - topical []  - 0 Application of Medications - injection INTERVENTIONS - Miscellaneous []  - External ear exam 0 []  - 0 Specimen Collection (cultures, biopsies, blood, body fluids, etc.) []  - 0 Specimen(s) / Culture(s) sent or taken to Lab for analysis []  - 0 Patient Transfer (multiple staff / Civil Service fast streamer / Similar devices) []  - 0 Simple Staple / Suture removal (25 or less) []  - 0 Complex Staple / Suture removal (26 or more) []  - 0 Hypo / Hyperglycemic Management (close monitor of Blood Glucose) []  - 0 Ankle / Brachial Index (ABI) - do not check if billed separately X- 1 5 Vital Signs Brimage, Emiley P. ([id number]) Has the patient been seen at the hospital within the last three years: Yes Total Score: 80 Level Of Care: New/Established - Level 3 Electronic Signature(s) Signed: 04/16/2018 4:44:15 PM By: Army Melia Entered By: Army Melia on 04/16/2018 16:36:10 Kretschmer, Luverta P. ([id number]) -------------------------------------------------------------------------------- Lower Extremity Assessment Details Patient Name: Lavallie, Hilari P. Date of Service: 04/16/2018 3:30 PM Medical Record Number: [id number] Patient Account Number:  [id number] Date of Birth/Sex: 1940/04/18 (78 y.o. F) Treating RN: Montey Hora Primary Care Loy Mccartt: Johny Drilling Other Clinician: Referring Nikiah Goin: Johny Drilling Treating Liza Czerwinski/Extender: Melburn Hake, HOYT Weeks in Treatment: 5 Edema Assessment Assessed: [Left: No] [Right: No] [Left: Edema] [Right: :] Calf Left: Right: Point of Measurement: 34 cm From Medial Instep 39 cm 42 cm Ankle Left: Right: Point of Measurement: 13 cm From Medial Instep 23.5 cm 23.5 cm Vascular Assessment Pulses: Dorsalis Pedis Palpable: [Left:Yes] [Right:Yes] Posterior Tibial Extremity colors, hair growth, and conditions: Extremity Color: [Left:Normal] [Right:Normal] Hair Growth on Extremity: [Left:No] [Right:No] Temperature of Extremity: [Left:Warm] [Right:Warm] Capillary Refill: [Left:< 3 seconds] [Right:< 3 seconds] Toe Nail Assessment Left: Right: Thick: Yes Yes Discolored: Yes Yes Deformed: No No Improper Length and Hygiene: No No Electronic Signature(s) Signed: 04/16/2018 4:49:35 PM By: Montey Hora Entered By: Montey Hora on 04/16/2018 16:08:00 Mandelbaum, Nithila P. ([id number]) -------------------------------------------------------------------------------- Multi Wound Chart Details Patient Name: Chesmore, Omolola P. Date of Service: 04/16/2018 3:30 PM Medical Record Number: [id number] Patient Account Number: [id number] Date of Birth/Sex: Oct 21, 1940 (78 y.o. F) Treating RN: Army Melia Primary Care Makinzi Prieur: Johny Drilling Other Clinician: Referring Eryn Marandola: Johny Drilling Treating Shana Zavaleta/Extender: Melburn Hake, HOYT Weeks in Treatment: 5 Vital Signs Height(in): 77 Pulse(bpm): 60 Weight(lbs): 216 Blood Pressure(mmHg): 167/65 Body Mass Index(BMI): 34 Temperature(F): 98.4 Respiratory Rate 16 (breaths/min): Photos: [1:No Photos] [3:No Photos] [4:No Photos] Wound Location: [1:Left Malleolus - Medial] [3:Left, Medial Lower Leg] [4:Left, Lateral Lower Leg] Wounding Event:  [1:Gradually Appeared] [3:Gradually Appeared] [4:Gradually Appeared] Primary Etiology: [1:Diabetic Wound/Ulcer of the Lower Extremity] [3:Diabetic Wound/Ulcer of the Lower Extremity] [4:Diabetic Wound/Ulcer of the Lower Extremity] Secondary Etiology: [1:Venous Leg Ulcer] [3:N/A] [4:N/A] Comorbid History: [1:Cataracts, Angina, Coronary Artery Disease, Hypertension, Myocardial Infarction, Peripheral Venous Disease, Type II Diabetes, Osteoarthritis, Neuropathy, Received Chemotherapy, Received Radiation] [3:N/A] [4:N/A] Date Acquired: [1:12/07/2017] [3:01/29/2016] [4:03/29/2018] Weeks  of Treatment: [1:5] [3:2] [4:2] Wound Status: [1:Open] [3:Healed - Epithelialized] [4:Healed - Epithelialized] Measurements L x W x D [1:0.5x0.4x0.1] [3:0x0x0] [4:0x0x0] (cm) Area (cm) : [1:0.157] [3:0] [4:0] Volume (cm) : [1:0.016] [3:0] [4:0] % Reduction in Area: [1:75.00%] [3:100.00%] [4:100.00%] % Reduction in Volume: [1:74.60%] [3:100.00%] [4:100.00%] Classification: [1:Grade 2] [3:Grade 1] [4:Grade 2] Exudate Amount: [1:None Present] [3:N/A] [4:N/A] Exudate Type: [1:N/A] [3:N/A] [4:N/A] Exudate Color: [1:N/A] [3:N/A] [4:N/A] Wound Margin: [1:Flat and Intact] [3:N/A] [4:N/A] Granulation Amount: [1:None Present (0%)] [3:N/A] [4:N/A] Granulation Quality: [1:N/A] [3:N/A] [4:N/A] Necrotic Amount: [1:Small (1-33%)] [3:N/A] [4:N/A] Necrotic Tissue: [1:Eschar] [3:N/A] [4:N/A] Exposed Structures: [1:Fat Layer (Subcutaneous Tissue) Exposed: Yes Fascia: No Tendon: No Muscle: No] [3:N/A] [4:N/A] Joint: No Bone: No Epithelialization: None N/A N/A Periwound Skin Texture: Excoriation: No No Abnormalities Noted No Abnormalities Noted Induration: No Callus: No Crepitus: No Rash: No Scarring: No Periwound Skin Moisture: Maceration: No No Abnormalities Noted No Abnormalities Noted Dry/Scaly: No Periwound Skin Color: Atrophie Blanche: No No Abnormalities Noted No Abnormalities Noted Cyanosis: No Ecchymosis:  No Erythema: No Hemosiderin Staining: No Mottled: No Pallor: No Rubor: No Temperature: No Abnormality N/A N/A Tenderness on Palpation: No No No Wound Preparation: Ulcer Cleansing: Other: soap N/A N/A and water Topical Anesthetic Applied: Other: lidocaine 4% Wound Number: 5 N/A N/A Photos: No Photos N/A N/A Wound Location: Right Lower Leg - Lateral N/A N/A Wounding Event: Blister N/A N/A Primary Etiology: Venous Leg Ulcer N/A N/A Secondary Etiology: N/A N/A N/A Comorbid History: Cataracts, Angina, Coronary N/A N/A Artery Disease, Hypertension, Myocardial Infarction, Peripheral Venous Disease, Type II Diabetes, Osteoarthritis, Neuropathy, Received Chemotherapy, Received Radiation Date Acquired: 04/09/2018 N/A N/A Weeks of Treatment: 0 N/A N/A Wound Status: Open N/A N/A Measurements L x W x D 4.3x2.4x0.1 N/A N/A (cm) Area (cm) : 8.105 N/A N/A Volume (cm) : 0.811 N/A N/A % Reduction in Area: N/A N/A N/A % Reduction in Volume: N/A N/A N/A Classification: Partial Thickness N/A N/A Exudate Amount: Medium N/A N/A Exudate Type: Serous N/A N/A Exudate Color: amber N/A N/A Wound Margin: Flat and Intact N/A N/A Granulation Amount: Large (67-100%) N/A N/A Granulation Quality: Pink N/A N/A Necrotic Amount: None Present (0%) N/A N/A Pamintuan, Nasiyah P. ([id number]) Necrotic Tissue: N/A N/A N/A Exposed Structures: Fascia: No N/A N/A Fat Layer (Subcutaneous Tissue) Exposed: No Tendon: No Muscle: No Joint: No Bone: No Limited to Skin Breakdown Epithelialization: Medium (34-66%) N/A N/A Periwound Skin Texture: Excoriation: No N/A N/A Induration: No Callus: No Crepitus: No Rash: No Scarring: No Periwound Skin Moisture: Maceration: No N/A N/A Dry/Scaly: No Periwound Skin Color: Atrophie Blanche: No N/A N/A Cyanosis: No Ecchymosis: No Erythema: No Hemosiderin Staining: No Mottled: No Pallor: No Rubor: No Temperature: No Abnormality N/A N/A Tenderness on  Palpation: No N/A N/A Wound Preparation: Ulcer Cleansing: Other: soap N/A N/A and water Topical Anesthetic Applied: Other: lidocaine 4% Treatment Notes Electronic Signature(s) Signed: 04/16/2018 4:44:15 PM By: Army Melia Entered By: Army Melia on 04/16/2018 16:24:25 Turan, Parker Mamie Nick ([id number]) -------------------------------------------------------------------------------- [place] Details Patient Name: Lawrance, Avacyn P. Date of Service: 04/16/2018 3:30 PM Medical Record Number: [id number] Patient Account Number: [id number] Date of Birth/Sex: [birth year] (78 y.o. F) Treating RN: Army Melia Primary Care Jocabed Cheese: Johny Drilling Other Clinician: Referring Nissi Doffing: Johny Drilling Treating Kaius Daino/Extender: Melburn Hake, HOYT Weeks in Treatment: 5 Active Inactive Abuse / Safety / Falls / Self Care Management Nursing Diagnoses: Potential for falls Goals: Patient will remain injury free related to falls Date Initiated: 03/09/2018 Target Resolution Date: 03/27/2018 Goal Status: Active  Interventions: Assess fall risk on admission and as needed Notes: Medication Nursing Diagnoses: Knowledge deficit related to medication safety: actual or potential Goals: Patient/caregiver will demonstrate understanding of all current medications Date Initiated: 03/09/2018 Target Resolution Date: 03/27/2018 Goal Status: Active Interventions: Assess for medication contraindications each visit where new medications are prescribed Notes: Orientation to the Wound Care Program Nursing Diagnoses: Knowledge deficit related to the wound healing center program Goals: Patient/caregiver will verbalize understanding of the [place] Date Initiated: 03/09/2018 Target Resolution Date: 03/27/2018 Goal Status: Active Interventions: Provide education on orientation to the wound center Steinert, Rabecka P. ([id number]) Notes: Venous Leg Ulcer Nursing Diagnoses: Potential  for venous Insuffiency (use before diagnosis confirmed) Goals: Non-invasive venous studies are completed as ordered Date Initiated: 03/09/2018 Target Resolution Date: 03/18/2018 Goal Status: Active Interventions: Assess peripheral edema status every visit. Treatment Activities: Non-invasive vascular studies : 03/09/2018 Venous Duplex Doppler : 03/18/2018 Notes: Wound/Skin Impairment Nursing Diagnoses: Impaired tissue integrity Goals: Patient/caregiver will verbalize understanding of skin care regimen Date Initiated: 03/09/2018 Target Resolution Date: 03/27/2018 Goal Status: Active Interventions: Assess patient/caregiver ability to obtain necessary supplies Treatment Activities: Patient referred to home care : 03/09/2018 Notes: Electronic Signature(s) Signed: 04/16/2018 4:44:15 PM By: Army Melia Entered By: Army Melia on 04/16/2018 16:24:17 Labrador, Erskine. ([id number]) -------------------------------------------------------------------------------- Pain Assessment Details Patient Name: Woulfe, Caitrin P. Date of Service: 04/16/2018 3:30 PM Medical Record Number: [id number] Patient Account Number: [id number] Date of Birth/Sex: 1940-03-21 (78 y.o. F) Treating RN: Montey Hora Primary Care Euna Armon: Johny Drilling Other Clinician: Referring Eliany Mccarter: Johny Drilling Treating Davette Nugent/Extender: Melburn Hake, HOYT Weeks in Treatment: 5 Active Problems Location of Pain Severity and Description of Pain Patient Has Paino No Site Locations Pain Management and Medication Current Pain Management: Electronic Signature(s) Signed: 04/16/2018 4:49:35 PM By: Montey Hora Entered By: Montey Hora on 04/16/2018 15:58:08 Jablonowski, Nashonda P. ([id number]) -------------------------------------------------------------------------------- Patient/Caregiver Education Details Patient Name: Rath, Cleon P. Date of Service: 04/16/2018 3:30 PM Medical Record Number: [id number] Patient Account  Number: [id number] Date of Birth/Gender: [birth year] (78 y.o. F) Treating RN: Army Melia Primary Care Physician: Johny Drilling Other Clinician: Referring Physician: Johny Drilling Treating Physician/Extender: Sharalyn Ink in Treatment: 5 Education Assessment Education Provided To: Patient Education Topics Provided Wound/Skin Impairment: Handouts: Caring for Your Ulcer Methods: Demonstration, Explain/Verbal Responses: State content correctly Electronic Signature(s) Signed: 04/16/2018 4:44:15 PM By: Army Melia Entered By: Army Melia on 04/16/2018 16:36:30 Kurtz, Yuliya P. ([id number]) -------------------------------------------------------------------------------- Wound Assessment Details Patient Name: Flannagan, Falan P. Date of Service: 04/16/2018 3:30 PM Medical Record Number: [id number] Patient Account Number: [id number] Date of Birth/Sex: Jun 23, 1940 (78 y.o. F) Treating RN: Army Melia Primary Care Suliman Termini: Johny Drilling Other Clinician: Referring Hilbert Briggs: Johny Drilling Treating Jusiah Aguayo/Extender: Melburn Hake, HOYT Weeks in Treatment: 5 Wound Status Wound Number: 1 Primary Diabetic Wound/Ulcer of the Lower Extremity Etiology: Wound Location: Left, Medial Malleolus Secondary Venous Leg Ulcer Wounding Event: Gradually Appeared Etiology: Date Acquired: 12/07/2017 Wound Healed - Epithelialized Weeks Of Treatment: 5 Status: Clustered Wound: No Comorbid Cataracts, Angina, Coronary Artery Disease, History: Hypertension, Myocardial Infarction, Peripheral Venous Disease, Type II Diabetes, Osteoarthritis, Neuropathy, Received Chemotherapy, Received Radiation Wound Measurements Length: (cm) 0 % Reduct Width: (cm) 0 % Reduct Depth: (cm) 0 Epitheli Area: (cm) 0 Tunneli Volume: (cm) 0 Undermi ion in Area: 100% ion in Volume: 100% alization: None ng: No ning: No Wound Description Classification: Grade 2 Foul Odo Wound Margin: Flat and Intact  Slough/F Exudate Amount: None Present r After Cleansing: No ibrino Yes Wound Bed Granulation  Amount: None Present (0%) Exposed Structure Necrotic Amount: Small (1-33%) Fascia Exposed: No Necrotic Quality: Eschar Fat Layer (Subcutaneous Tissue) Exposed: Yes Tendon Exposed: No Muscle Exposed: No Joint Exposed: No Bone Exposed: No Periwound Skin Texture Texture Color No Abnormalities Noted: No No Abnormalities Noted: No Callus: No Atrophie Blanche: No Crepitus: No Cyanosis: No Excoriation: No Ecchymosis: No Induration: No Erythema: No Rash: No Hemosiderin Staining: No Scarring: No Mottled: No Pallor: No Moisture Rubor: No No Abnormalities Noted: No Dry / Scaly: No Temperature / Pain Habel, Elainah P. ([id number]) Maceration: No Temperature: No Abnormality Wound Preparation Ulcer Cleansing: Other: soap and water, Topical Anesthetic Applied: Other: lidocaine 4%, Electronic Signature(s) Signed: 04/16/2018 4:44:15 PM By: Army Melia Entered By: Army Melia on 04/16/2018 16:26:33 Garton, Breuna P. ([id number]) -------------------------------------------------------------------------------- Wound Assessment Details Patient Name: Horlacher, Falesha P. Date of Service: 04/16/2018 3:30 PM Medical Record Number: [id number] Patient Account Number: [id number] Date of Birth/Sex: 09/26/1940 (78 y.o. F) Treating RN: Montey Hora Primary Care Meghan Tiemann: Johny Drilling Other Clinician: Referring Jaisha Villacres: Johny Drilling Treating Alyce Inscore/Extender: Melburn Hake, HOYT Weeks in Treatment: 5 Wound Status Wound Number: 3 Primary Diabetic Wound/Ulcer of the Lower Etiology: Extremity Wound Location: Left, Medial Lower Leg Wound Status: Healed - Epithelialized Wounding Event: Gradually Appeared Date Acquired: 01/29/2016 Weeks Of Treatment: 2 Clustered Wound: No Wound Measurements Length: (cm) 0 % Width: (cm) 0 % Depth: (cm) 0 Area: (cm) 0 Volume: (cm) 0 Reduction in Area:  100% Reduction in Volume: 100% Wound Description Classification: Grade 1 Periwound Skin Texture Texture Color No Abnormalities Noted: No No Abnormalities Noted: No Moisture No Abnormalities Noted: No Electronic Signature(s) Signed: 04/16/2018 4:49:35 PM By: Montey Hora Entered By: Montey Hora on 04/16/2018 16:13:38 Dufrane, Maddyn P. ([id number]) -------------------------------------------------------------------------------- Wound Assessment Details Patient Name: Teuscher, Elianys P. Date of Service: 04/16/2018 3:30 PM Medical Record Number: [id number] Patient Account Number: [id number] Date of Birth/Sex: [birth year] (78 y.o. F) Treating RN: Montey Hora Primary Care Bee Hammerschmidt: Johny Drilling Other Clinician: Referring Johnmichael Melhorn: Johny Drilling Treating Emmersyn Kratzke/Extender: Melburn Hake, HOYT Weeks in Treatment: 5 Wound Status Wound Number: 4 Primary Diabetic Wound/Ulcer of the Lower Etiology: Extremity Wound Location: Left, Lateral Lower Leg Wound Status: Healed - Epithelialized Wounding Event: Gradually Appeared Date Acquired: 03/29/2018 Weeks Of Treatment: 2 Clustered Wound: No Wound Measurements Length: (cm) 0 % Width: (cm) 0 % Depth: (cm) 0 Area: (cm) 0 Volume: (cm) 0 Reduction in Area: 100% Reduction in Volume: 100% Wound Description Classification: Grade 2 Periwound Skin Texture Texture Color No Abnormalities Noted: No No Abnormalities Noted: No Moisture No Abnormalities Noted: No Electronic Signature(s) Signed: 04/16/2018 4:49:35 PM By: Montey Hora Entered By: Montey Hora on 04/16/2018 16:13:38 Sutcliffe, Ellaina P. ([id number]) -------------------------------------------------------------------------------- Wound Assessment Details Patient Name: Lawson, Layli P. Date of Service: 04/16/2018 3:30 PM Medical Record Number: [id number] Patient Account Number: [id number] Date of Birth/Sex: Nov 06, 1940 (78 y.o. F) Treating RN: Montey Hora Primary Care  Ketih Goodie: Johny Drilling Other Clinician: Referring Nikkole Placzek: Johny Drilling Treating Ebb Carelock/Extender: Melburn Hake, HOYT Weeks in Treatment: 5 Wound Status Wound Number: 5 Primary Venous Leg Ulcer Etiology: Wound Location: Right Lower Leg - Lateral Wound Open Wounding Event: Blister Status: Date Acquired: 04/09/2018 Comorbid Cataracts, Angina, Coronary Artery Disease, Weeks Of Treatment: 0 History: Hypertension, Myocardial Infarction, Peripheral Clustered Wound: No Venous Disease, Type II Diabetes, Osteoarthritis, Neuropathy, Received Chemotherapy, Received Radiation Wound Measurements Length: (cm) 4.3 Width: (cm) 2.4 Depth: (cm) 0.1 Area: (cm) 8.105 Volume: (cm) 0.811 % Reduction in Area: % Reduction in Volume: Epithelialization: Medium (34-66%) Tunneling: No Undermining: No Wound  Description Classification: Partial Thickness Foul Odo Wound Margin: Flat and Intact Slough/F Exudate Amount: Medium Exudate Type: Serous Exudate Color: amber r After Cleansing: No ibrino No Wound Bed Granulation Amount: Large (67-100%) Exposed Structure Granulation Quality: Pink Fascia Exposed: No Necrotic Amount: None Present (0%) Fat Layer (Subcutaneous Tissue) Exposed: No Tendon Exposed: No Muscle Exposed: No Joint Exposed: No Bone Exposed: No Limited to Skin Breakdown Periwound Skin Texture Texture Color No Abnormalities Noted: No No Abnormalities Noted: No Callus: No Atrophie Blanche: No Crepitus: No Cyanosis: No Excoriation: No Ecchymosis: No Induration: No Erythema: No Rash: No Hemosiderin Staining: No Scarring: No Mottled: No Pallor: No Moisture Rubor: No Lazaro, Will P. ([id number]) No Abnormalities Noted: No Temperature / Pain Dry / Scaly: No Temperature: No Abnormality Maceration: No Wound Preparation Ulcer Cleansing: Other: soap and water, Topical Anesthetic Applied: Other: lidocaine 4%, Electronic Signature(s) Signed: 04/16/2018 4:49:35 PM By:  Montey Hora Entered By: Montey Hora on 04/16/2018 16:12:27 Leppo, Bradleigh P. ([id number]) -------------------------------------------------------------------------------- Vitals Details Patient Name: Gross, Graciella P. Date of Service: 04/16/2018 3:30 PM Medical Record Number: [id number] Patient Account Number: [id number] Date of Birth/Sex: 1940-06-10 (78 y.o. F) Treating RN: Montey Hora Primary Care Cassondra Stachowski: Johny Drilling Other Clinician: Referring Scarlettrose Costilow: Johny Drilling Treating Russ Looper/Extender: Melburn Hake, HOYT Weeks in Treatment: 5 Vital Signs Time Taken: 15:59 Temperature (F): 98.4 Height (in): 67 Pulse (bpm): 81 Weight (lbs): 216 Respiratory Rate (breaths/min): 16 Body Mass Index (BMI): 33.8 Blood Pressure (mmHg): 167/65 Reference Range: 80 - 120 mg / dl Electronic Signature(s) Signed: 04/16/2018 4:49:35 PM By: Montey Hora Entered By: Montey Hora on 04/16/2018 16:00:51

## 2018-04-18 NOTE — Progress Notes (Signed)
Theresa [name], Theresa [name] ([id number]) Visit Report for 04/16/2018 Chief Complaint Document Details Patient Name: Theresa [name], Theresa [name]. Date of Service: 04/16/2018 3:30 PM Medical Record Number: [id number] Patient Account Number: [id number] Date of Birth/Sex: 07-10-1940 (78 y.o. F) Treating RN: Army Melia Primary Care Provider: Johny Drilling Other Clinician: Referring Provider: Johny Drilling Treating Provider/Extender: Melburn Hake, HOYT Weeks in Treatment: 5 Information Obtained from: Patient Chief Complaint Bilateral LE ulcers Electronic Signature(s) Signed: 04/17/2018 11:59:00 AM By: Worthy Keeler PA-C Entered By: Worthy Keeler on 04/16/2018 15:47:05 Theresa [name], Theresa [name]. ([id number]) -------------------------------------------------------------------------------- HPI Details Patient Name: Theresa [name], Theresa [name]. Date of Service: 04/16/2018 3:30 PM Medical Record Number: [id number] Patient Account Number: [id number] Date of Birth/Sex: 1940/04/20 (78 y.o. F) Treating RN: Army Melia Primary Care Provider: Johny Drilling Other Clinician: Referring Provider: Johny Drilling Treating Provider/Extender: Melburn Hake, HOYT Weeks in Treatment: 5 History of Present Illness HPI Description: 03/09/18 on evaluation today patient presents for initial inspection in our clinic concerning issues that she has been having with bilateral lower extremity ulcers. She has lymphedema as well as chronic venous stasis. She has seen Dr. dew where she's undergone ABI testing with a left ABI 1.28 a right ABI 1.22. Subsequently she is supposed to be undergoing venous studies as well which are upcoming. She does have a history of diabetes, chronic venous stasis, peripheral vascular disease, and hypertension. Currently these wounds which are currently draining are stated to have been open for roughly 2 weeks or so. It does not appear to be any evidence of infection. No fevers, chills, nausea, or vomiting noted at this time. With that  being said the patient does have some prominence to the ankle medially especially on the left side and I'm not sure if something was rubbing which calls this issue either. She is unable to get around and move very well simply due to the fact that she has an issue with her right hip as well which unfortunately prevents her from being able to move around very much. She did have an [place] placed at the request of her daughter when she was at vascular this seems to may be potentially helped a little bit. 03/16/18 on evaluation today patient's right lower extremity appears to likely be completely healed although there still small area of concern which I'm gonna continue to watch. The really does not appear to be any weeping at this point which is good news. Overall I feel like she is done excellent. In regard to left lower extremity this is a little larger still than the right although again the swelling is indeed down. The wound over the media malleolus does appear to be doing better which is good news. No fevers, chills, nausea, or vomiting noted at this time. Unfortunately the patient still continues to have right hip pain which is getting more severe. Again her surgeon did not want to undergo surgery until everything was healed as far as the wounds of the right lower extremity. 03/23/18 on evaluation today patient actually appears to be doing excellent in regard to her right lower extremity. There are no open wounds at this point in time and there is no weeping of drainage the compression wraps appear to have done very well for her. She did get the FarrowWrap 4000 Velcro compression wraps which we are gonna initiate treatment with today and this should help continue to keep swelling under control as well is the weeping. Again everything appears to be doing great in regard to the right lower  extremity. In regard to left lower extremity everything is pretty much close there are no significant open  wounds the only actual open wound that she has is on the medial left ankle which again is the opposite side from where her hip issue is and this is very superficial and shows no signs of infection at this point. 04/02/18 on evaluation today patient actually presents for an early appointment after I saw her last week due to several areas the left lower extremity which had reopened causing some issues at this point. Fortunately there does not appear to be any evidence of infection at this time. With that being said she again is waiting on everything to be better in order to be able to proceed with the surgery for her right hip as previously suggested. 04/09/18 on evaluation today patient actually appears to be doing better in regard to her left lower surety ulcers which is great news. Unfortunately she continues to have issues even on the right side there are no open wounds that she does have a blister that arose on the right at this point as well. Fortunately there's no evidence of infection currently. No fevers, chills, nausea, or vomiting noted at this time. 04/16/18 on evaluation today patient actually appears to be doing very well in regard to her left lower extremity all the wounds appear to be completely healed including the left medial malleolus which was of greatest concern. The only if and when she has some regard to the right lower extremity where there is a very superficial area of blistering which is open at this point. Fortunately there's no signs of active infection otherwise. Overall I feel like she would be very safe to proceed with that surgery at this time. Electronic Signature(s) Signed: 04/17/2018 11:59:00 AM By: Herbert Pun, Harvey [name]. ([id number]) Entered By: Worthy Keeler on 04/17/2018 11:57:57 Theresa [name], Theresa [name] ([id number]) -------------------------------------------------------------------------------- Physical Exam Details Patient Name: Theresa [name], Theresa  [name]. Date of Service: 04/16/2018 3:30 PM Medical Record Number: [id number] Patient Account Number: [id number] Date of Birth/Sex: [birth year] (78 y.o. F) Treating RN: Army Melia Primary Care Provider: Johny Drilling Other Clinician: Referring Provider: Johny Drilling Treating Provider/Extender: Melburn Hake, HOYT Weeks in Treatment: 5 Constitutional Well-nourished and well-hydrated in no acute distress. Respiratory normal breathing without difficulty. clear to auscultation bilaterally. Cardiovascular regular rate and rhythm with normal S1, S2. Psychiatric this patient is able to make decisions and demonstrates good insight into disease process. Alert and Oriented x 3. pleasant and cooperative. Notes Patient's wound bed currently shows again new skin growth underneath the area which was blistered last week this appears to be doing well I think will likely heal over the next week in regard to the right lower extremity everything else has done excellent. Electronic Signature(s) Signed: 04/17/2018 11:59:00 AM By: Worthy Keeler PA-C Entered By: Worthy Keeler on 04/17/2018 11:58:18 Theresa [name], Theresa [name] ([id number]) -------------------------------------------------------------------------------- Physician Orders Details Patient Name: Theresa [name], Theresa [name]. Date of Service: 04/16/2018 3:30 PM Medical Record Number: [id number] Patient Account Number: [id number] Date of Birth/Sex: 1940/05/12 (78 y.o. F) Treating RN: Army Melia Primary Care Provider: Johny Drilling Other Clinician: Referring Provider: Johny Drilling Treating Provider/Extender: Melburn Hake, HOYT Weeks in Treatment: 5 Verbal / Phone Orders: No Diagnosis Coding ICD-10 Coding Code Description E11.622 Type 2 diabetes mellitus with other skin ulcer I87.2 Venous insufficiency (chronic) (peripheral) L97.322 Non-pressure chronic ulcer of left ankle with fat layer exposed L97.812 Non-pressure chronic ulcer of other part of right lower leg  with  fat layer exposed I73.89 Other specified peripheral vascular diseases I10 Essential (primary) hypertension Wound Cleansing Wound #5 Right,Lateral Lower Leg o Clean wound with Normal Saline. Primary Wound Dressing Wound #5 Right,Lateral Lower Leg o Silver Alginate Secondary Dressing Wound #5 Right,Lateral Lower Leg o ABD pad Dressing Change Frequency o Change dressing every week Follow-up Appointments o Return Appointment in 1 week. Electronic Signature(s) Signed: 04/16/2018 4:44:15 PM By: Army Melia Signed: 04/17/2018 11:59:00 AM By: Worthy Keeler PA-C Entered By: Army Melia on 04/16/2018 16:35:38 Theresa [name], Theresa [name]. ([id number]) -------------------------------------------------------------------------------- Problem List Details Patient Name: Theresa [name], Theresa [name]. Date of Service: 04/16/2018 3:30 PM Medical Record Number: [id number] Patient Account Number: [id number] Date of Birth/Sex: 05-16-1940 (78 y.o. F) Treating RN: Army Melia Primary Care Provider: Johny Drilling Other Clinician: Referring Provider: Johny Drilling Treating Provider/Extender: Melburn Hake, HOYT Weeks in Treatment: 5 Active Problems ICD-10 Evaluated Encounter Code Description Active Date Today Diagnosis E11.622 Type 2 diabetes mellitus with other skin ulcer 03/09/2018 No Yes I87.2 Venous insufficiency (chronic) (peripheral) 03/09/2018 No Yes L97.322 Non-pressure chronic ulcer of left ankle with fat layer 03/09/2018 No Yes exposed L97.812 Non-pressure chronic ulcer of other part of right lower leg 03/09/2018 No Yes with fat layer exposed I73.89 Other specified peripheral vascular diseases 03/09/2018 No Yes I10 Essential (primary) hypertension 03/09/2018 No Yes Inactive Problems Resolved Problems Electronic Signature(s) Signed: 04/17/2018 11:59:00 AM By: Worthy Keeler PA-C Entered By: Worthy Keeler on 04/16/2018 15:47:01 Theresa [name], Theresa [name].  ([id number]) -------------------------------------------------------------------------------- Progress Note Details Patient Name: Theresa [name], Theresa [name]. Date of Service: 04/16/2018 3:30 PM Medical Record Number: [id number] Patient Account Number: [id number] Date of Birth/Sex: 08/03/1940 (78 y.o. F) Treating RN: Army Melia Primary Care Provider: Johny Drilling Other Clinician: Referring Provider: Johny Drilling Treating Provider/Extender: Melburn Hake, HOYT Weeks in Treatment: 5 Subjective Chief Complaint Information obtained from Patient Bilateral LE ulcers History of Present Illness (HPI) 03/09/18 on evaluation today patient presents for initial inspection in our clinic concerning issues that she has been having with bilateral lower extremity ulcers. She has lymphedema as well as chronic venous stasis. She has seen Dr. dew where she's undergone ABI testing with a left ABI 1.28 a right ABI 1.22. Subsequently she is supposed to be undergoing venous studies as well which are upcoming. She does have a history of diabetes, chronic venous stasis, peripheral vascular disease, and hypertension. Currently these wounds which are currently draining are stated to have been open for roughly 2 weeks or so. It does not appear to be any evidence of infection. No fevers, chills, nausea, or vomiting noted at this time. With that being said the patient does have some prominence to the ankle medially especially on the left side and I'm not sure if something was rubbing which calls this issue either. She is unable to get around and move very well simply due to the fact that she has an issue with her right hip as well which unfortunately prevents her from being able to move around very much. She did have an [place] placed at the request of her daughter when she was at vascular this seems to may be potentially helped a little bit. 03/16/18 on evaluation today patient's right lower extremity appears to likely be completely  healed although there still small area of concern which I'm gonna continue to watch. The really does not appear to be any weeping at this point which is good news. Overall I feel like she is done excellent. In regard to left lower extremity  this is a little larger still than the right although again the swelling is indeed down. The wound over the media malleolus does appear to be doing better which is good news. No fevers, chills, nausea, or vomiting noted at this time. Unfortunately the patient still continues to have right hip pain which is getting more severe. Again her surgeon did not want to undergo surgery until everything was healed as far as the wounds of the right lower extremity. 03/23/18 on evaluation today patient actually appears to be doing excellent in regard to her right lower extremity. There are no open wounds at this point in time and there is no weeping of drainage the compression wraps appear to have done very well for her. She did get the FarrowWrap 4000 Velcro compression wraps which we are gonna initiate treatment with today and this should help continue to keep swelling under control as well is the weeping. Again everything appears to be doing great in regard to the right lower extremity. In regard to left lower extremity everything is pretty much close there are no significant open wounds the only actual open wound that she has is on the medial left ankle which again is the opposite side from where her hip issue is and this is very superficial and shows no signs of infection at this point. 04/02/18 on evaluation today patient actually presents for an early appointment after I saw her last week due to several areas the left lower extremity which had reopened causing some issues at this point. Fortunately there does not appear to be any evidence of infection at this time. With that being said she again is waiting on everything to be better in order to be able to proceed with the  surgery for her right hip as previously suggested. 04/09/18 on evaluation today patient actually appears to be doing better in regard to her left lower surety ulcers which is great news. Unfortunately she continues to have issues even on the right side there are no open wounds that she does have a blister that arose on the right at this point as well. Fortunately there's no evidence of infection currently. No fevers, chills, nausea, or vomiting noted at this time. 04/16/18 on evaluation today patient actually appears to be doing very well in regard to her left lower extremity all the wounds appear to be completely healed including the left medial malleolus which was of greatest concern. The only if and when she has some regard to the right lower extremity where there is a very superficial area of blistering which is open at this point. Fortunately there's no signs of active infection otherwise. Overall I feel like she would be very safe to proceed with that Kasprzak, Zlata [name]. ([id number]) surgery at this time. Patient History Information obtained from Patient. Social History Never smoker, Marital Status - Married, Alcohol Use - Never, Drug Use - No History, Caffeine Use - Never. Medical History Eyes Patient has history of Cataracts - removed Denies history of Glaucoma, Optic Neuritis Ear/Nose/Mouth/Throat Denies history of Chronic sinus problems/congestion, Middle ear problems Hematologic/Lymphatic Denies history of Anemia, Hemophilia, Human Immunodeficiency Virus, Lymphedema, Sickle Cell Disease Respiratory Denies history of Aspiration, Asthma, Chronic Obstructive Pulmonary Disease (COPD), Pneumothorax, Sleep Apnea, Tuberculosis Cardiovascular Patient has history of Angina, Coronary Artery Disease, Hypertension, Myocardial Infarction - more than 5 years ago, Peripheral Venous Disease Denies history of Arrhythmia, Congestive Heart Failure, Deep Vein Thrombosis, Hypotension, Peripheral  Arterial Disease, Phlebitis, Vasculitis Gastrointestinal Denies history of Cirrhosis ,  Colitis, Crohn s, Hepatitis A, Hepatitis B, Hepatitis C Endocrine Patient has history of Type II Diabetes Denies history of Type I Diabetes Genitourinary Denies history of End Stage Renal Disease Immunological Denies history of Lupus Erythematosus, Raynaud s, Scleroderma Integumentary (Skin) Denies history of History of Burn, History of pressure wounds Musculoskeletal Patient has history of Osteoarthritis Denies history of Gout, Rheumatoid Arthritis, Osteomyelitis Neurologic Patient has history of Neuropathy Denies history of Dementia, Quadriplegia, Paraplegia, Seizure Disorder Oncologic Patient has history of Received Chemotherapy, Received Radiation Psychiatric Denies history of Anorexia/bulimia, Confinement Anxiety Medical And Surgical History Notes Genitourinary CKD stage 3 Oncologic 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 Review of Systems (ROS) Constitutional Symptoms (General Health) Denies complaints or symptoms of Fever, Chills. Respiratory The patient has no complaints or symptoms. Cardiovascular The patient has no complaints or symptoms. SKYLYNNE, SCHLECHTER ([id number]) Psychiatric The patient has no complaints or symptoms. Objective Constitutional Well-nourished and well-hydrated in no acute distress. Vitals Time Taken: 3:59 PM, Height: 67 in, Weight: 216 lbs, BMI: 33.8, Temperature: 98.4 F, Pulse: 81 bpm, Respiratory Rate: 16 breaths/min, Blood Pressure: 167/65 mmHg. Respiratory normal breathing without difficulty. clear to auscultation bilaterally. Cardiovascular regular rate and rhythm with normal S1, S2. Psychiatric this patient is able to make decisions and demonstrates good insight into disease process. Alert and Oriented x 3. pleasant and cooperative. General Notes: Patient's wound bed currently shows again new skin growth underneath the area  which was blistered last week this appears to be doing well I think will likely heal over the next week in regard to the right lower extremity everything else has done excellent. Integumentary (Hair, Skin) Wound #1 status is Healed - Epithelialized. Original cause of wound was Gradually Appeared. The wound is located on the Left,Medial Malleolus. The wound measures 0cm length x 0cm width x 0cm depth; 0cm^2 area and 0cm^3 volume. There is Fat Layer (Subcutaneous Tissue) Exposed exposed. There is no tunneling or undermining noted. There is a none present amount of drainage noted. The wound margin is flat and intact. There is no granulation within the wound bed. There is a small (1-33%) amount of necrotic tissue within the wound bed including Eschar. The periwound skin appearance did not exhibit: Callus, Crepitus, Excoriation, Induration, Rash, Scarring, Dry/Scaly, Maceration, Atrophie Blanche, Cyanosis, Ecchymosis, Hemosiderin Staining, Mottled, Pallor, Rubor, Erythema. Periwound temperature was noted as No Abnormality. Wound #3 status is Healed - Epithelialized. Original cause of wound was Gradually Appeared. The wound is located on the Left,Medial Lower Leg. The wound measures 0cm length x 0cm width x 0cm depth; 0cm^2 area and 0cm^3 volume. Wound #4 status is Healed - Epithelialized. Original cause of wound was Gradually Appeared. The wound is located on the Left,Lateral Lower Leg. The wound measures 0cm length x 0cm width x 0cm depth; 0cm^2 area and 0cm^3 volume. Wound #5 status is Open. Original cause of wound was Blister. The wound is located on the Right,Lateral Lower Leg. The wound measures 4.3cm length x 2.4cm width x 0.1cm depth; 8.105cm^2 area and 0.811cm^3 volume. The wound is limited to skin breakdown. There is no tunneling or undermining noted. There is a medium amount of serous drainage noted. The wound margin is flat and intact. There is large (67-100%) pink granulation within the  wound bed. There is no necrotic tissue within the wound bed. The periwound skin appearance did not exhibit: Callus, Crepitus, Excoriation, Induration, Rash, Scarring, Dry/Scaly, Maceration, Atrophie Blanche, Cyanosis, Ecchymosis, Hemosiderin Staining, Mottled, Pallor, Rubor, Erythema. Periwound temperature  was noted as No Abnormality. 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 Primary Wound Dressing: Wound #5 Right,Lateral Lower Leg: Silver Alginate Secondary Dressing: Wound #5 Right,Lateral Lower Leg: ABD pad Dressing Change Frequency: Change dressing every week Follow-up Appointments: Return Appointment in 1 week. My suggestion currently is gonna be that the patient continued of monster and manage her fluid as we have been at this point. I still think the lymphedema pumps will be beneficial again we have been using compression wraps for her lymphedema and she also does elevate her legs. She has issues with her right hip but nonetheless even with that being said she still elevates her legs nonetheless. Once the hip is better shall also be able to walk in that will in turn help with the fluid management as well but nonetheless even though her left leg is doing well and she can use it she still is having the lymphedema and swelling. I do believe the lymphedema pumps will be beneficial and again we get everything we can up to this point to help her prior to the initiation recommendation of lymphedema pumps. We'll see were things stand at follow-up. Please see above  for specific wound care orders. We will see patient for re-evaluation in 1 week(s) here in the clinic. If anything worsens or changes patient will contact our office for additional recommendations. if the patient is doing better and everything is healed and want to the right lower extremity by next week then she does not have to come back for reevaluation she can just go ahead and proceed with colleagues to cancel that appointment and will see her back as needed. Hopefully she'll be able to get her hip surgery soon with the current state of quarantine due to the covert 19 virus I'm not sure how things such as elective surgery are gonna do for her. We shall see. Electronic Signature(s) TERREN, HABERLE ([id number]) Signed: 04/17/2018 11:59:00 AM By: Worthy Keeler PA-C Entered By: Worthy Keeler on 04/17/2018 11:58:27 Marlatt, Kairy Mamie [name] ([id number]) -------------------------------------------------------------------------------- ROS/PFSH Details Patient Name: Rausch, Taneia [name]. Date of Service: 04/16/2018 3:30 PM Medical Record Number: [id number] Patient Account Number: [id number] Date of Birth/Sex: 01-07-1941 (78 y.o. F) Treating RN: Army Melia Primary Care Provider: Johny Drilling Other Clinician: Referring Provider: Johny Drilling Treating Provider/Extender: Melburn Hake, HOYT Weeks in Treatment: 5 Information Obtained From Patient Wound History Do you currently have one or more open woundso Yes How many open wounds do you currently haveo 3 Approximately how long have you had your woundso months How have you been treating your wound(s) until nowo unna boots Has your wound(s) ever healed and then re-openedo No Have you had any lab work done in the past montho No Have you tested positive for an antibiotic resistant organism (MRSA, VRE)o No Have you tested positive for osteomyelitis (bone infection)o No Have you had any tests for circulation on your legso No Have you had other problems  associated with your woundso Swelling Constitutional Symptoms (General Health) Complaints and Symptoms: Negative for: Fever; Chills Eyes Medical History: Positive for: Cataracts - removed Negative for: Glaucoma; Optic Neuritis Ear/Nose/Mouth/Throat Medical History: Negative for: Chronic sinus  problems/congestion; Middle ear problems Hematologic/Lymphatic Medical History: Negative for: Anemia; Hemophilia; Human Immunodeficiency Virus; Lymphedema; Sickle Cell Disease Respiratory Complaints and Symptoms: No Complaints or Symptoms Medical History: Negative for: Aspiration; Asthma; Chronic Obstructive Pulmonary Disease (COPD); Pneumothorax; Sleep Apnea; Tuberculosis Cardiovascular Complaints and Symptoms: No Complaints or Symptoms Sweney, Shoshanah [name]. ([id number]) Medical History: Positive for: Angina; Coronary Artery Disease; Hypertension; Myocardial Infarction - more than 5 years ago; Peripheral Venous Disease Negative for: Arrhythmia; Congestive Heart Failure; Deep Vein Thrombosis; Hypotension; Peripheral Arterial Disease; Phlebitis; Vasculitis Gastrointestinal Medical History: Negative for: Cirrhosis ; Colitis; Crohnos; Hepatitis A; Hepatitis B; Hepatitis C Endocrine Medical History: Positive for: Type II Diabetes Negative for: Type I Diabetes Genitourinary Medical History: Negative for: End Stage Renal Disease Past Medical History Notes: CKD stage 3 Immunological Medical History: Negative for: Lupus Erythematosus; Raynaudos; Scleroderma Integumentary (Skin) Medical History: Negative for: History of Burn; History of pressure wounds Musculoskeletal Medical History: Positive for: Osteoarthritis Negative for: Gout; Rheumatoid Arthritis; Osteomyelitis Neurologic Medical History: Positive for: Neuropathy Negative for: Dementia; Quadriplegia; Paraplegia; Seizure Disorder Oncologic Medical History: Positive for: Received Chemotherapy; Received Radiation Past Medical  History Notes: 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 Psychiatric Complaints and Symptoms: No Complaints or Symptoms Medical History: CHINWE, LOPE ([id number]) Negative for: Anorexia/bulimia; Confinement Anxiety HBO Extended History Items Eyes: Cataracts Immunizations Pneumococcal Vaccine: Received Pneumococcal Vaccination: Yes Implantable Devices No devices added Family and Social History Never smoker; Marital Status - Married; Alcohol Use: Never; Drug Use: No History; Caffeine Use: Never; Financial Concerns: No; Food, Clothing or Shelter Needs: No; Support System Lacking: No; Transportation Concerns: No; Advanced Directives: No; Patient does not want information on Advanced Directives Physician Affirmation I have reviewed and agree with the above information. Electronic Signature(s) Signed: 04/17/2018 11:59:00 AM By: Worthy Keeler PA-C Signed: 04/17/2018 2:42:08 PM By: Army Melia Entered By: Worthy Keeler on 04/17/2018 11:58:09 Kishimoto, Rosalinda [name]. ([id number]) -------------------------------------------------------------------------------- SuperBill Details Patient Name: Grindle, Yanna [name]. Date of Service: 04/16/2018 Medical Record Number: [id number] Patient Account Number: [id number] Date of Birth/Sex: 1940-10-15 (78 y.o. 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 I73.89 Other specified peripheral vascular diseases I10 Essential (primary) hypertension Facility Procedures CPT4 Code:  16384536 Description: 99213 - WOUND CARE VISIT-LEV 3 EST PT Modifier: Quantity: 1 Physician Procedures CPT4 Code Description: 4680321 22482 - WC PHYS LEVEL 4 - EST PT ICD-10 Diagnosis Description E11.622 Type 2 diabetes mellitus with other skin ulcer I87.2 Venous insufficiency (chronic) (peripheral) L97.322 Non-pressure chronic ulcer of left ankle with  fat layer expose L97.812 Non-pressure chronic ulcer of other part of right lower leg wi Modifier: d th fat layer expo Quantity: 1 sed Electronic Signature(s) Signed: 04/17/2018 11:59:00 AM By: Worthy Keeler PA-C Entered By: Worthy Keeler on 04/17/2018 11:58:37

## 2018-04-20 ENCOUNTER — Ambulatory Visit: Payer: Medicare HMO | Admitting: Physician Assistant

## 2018-04-21 ENCOUNTER — Encounter: Payer: Medicare HMO | Admitting: Physician Assistant

## 2018-04-21 ENCOUNTER — Other Ambulatory Visit: Payer: Self-pay

## 2018-04-21 DIAGNOSIS — E11622 Type 2 diabetes mellitus with other skin ulcer: Secondary | ICD-10-CM | POA: Diagnosis not present

## 2018-04-23 ENCOUNTER — Ambulatory Visit: Payer: Medicare HMO | Admitting: Physician Assistant

## 2018-04-23 NOTE — Progress Notes (Addendum)
ARLENE, BRICKEL ([id number]) Visit Report for 04/21/2018 Arrival Information Details Patient Name: Theresa [name], Theresa [name]. Date of Service: 04/21/2018 8:30 AM Medical Record Number: [id number] Patient Account Number: [id number] Date of Birth/Sex: 22-May-1940 (78 y.o. F) Treating RN: Montey Hora Primary Care Chevette Fee: Johny Drilling Other Clinician: Referring Venkat Ankney: Johny Drilling Treating Wendy Mikles/Extender: Melburn Hake, HOYT Weeks in Treatment: 6 Visit Information History Since Last Visit Added or deleted any medications: No Patient Arrived: Wheel Chair Any new allergies or adverse reactions: No Arrival Time: 08:40 Had a fall or experienced change in No Accompanied By: daughter activities of daily living that may affect Transfer Assistance: None risk of falls: Patient Identification Verified: Yes Signs or symptoms of abuse/neglect since last visito No Secondary Verification Process Yes Hospitalized since last visit: No Completed: Implantable device outside of the clinic excluding No Patient Has Alerts: Yes cellular tissue based products placed in the center Patient Alerts: Patient on Blood since last visit: Thinner Has Dressing in Place as Prescribed: Yes Plavix, 81mg  aspirin Pain Present Now: Yes Type II Diabetic 03/06/2018 ABI AVVS (L) 1.28 (R) 1.22 Electronic Signature(s) Signed: 04/21/2018 4:07:26 PM By: Lorine Bears RCP, RRT, CHT Entered By: Lorine Bears on 04/21/2018 08:41:45 Jaworski, Letonya P. ([id number]) -------------------------------------------------------------------------------- Clinic Level of Care Assessment Details Patient Name: Licklider, Elysabeth P. Date of Service: 04/21/2018 8:30 AM Medical Record Number: [id number] Patient Account Number: [id number] Date of Birth/Sex: March 02, 1940 (78 y.o. F) Treating RN: Montey Hora Primary Care Daisie Haft: Johny Drilling Other Clinician: Referring Abshir Paolini: Johny Drilling Treating  Ozro Russett/Extender: Melburn Hake, HOYT Weeks in Treatment: 6 Clinic Level of Care Assessment Items TOOL 4 Quantity Score []  - Use when only an EandM is performed on FOLLOW-UP visit 0 ASSESSMENTS - Nursing Assessment / Reassessment X - Reassessment of Co-morbidities (includes updates in patient status) 1 10 X- 1 5 Reassessment of Adherence to Treatment Plan ASSESSMENTS - Wound and Skin Assessment / Reassessment X - Simple Wound Assessment / Reassessment - one wound 1 5 []  - 0 Complex Wound Assessment / Reassessment - multiple wounds []  - 0 Dermatologic / Skin Assessment (not related to wound area) ASSESSMENTS - Focused Assessment X - Circumferential Edema Measurements - multi extremities 1 5 []  - 0 Nutritional Assessment / Counseling / Intervention X- 1 5 Lower Extremity Assessment (monofilament, tuning fork, pulses) []  - 0 Peripheral Arterial Disease Assessment (using hand held doppler) ASSESSMENTS - Ostomy and/or Continence Assessment and Care []  - Incontinence Assessment and Management 0 []  - 0 Ostomy Care Assessment and Management (repouching, etc.) PROCESS - Coordination of Care X - Simple Patient / Family Education for ongoing care 1 15 []  - 0 Complex (extensive) Patient / Family Education for ongoing care X- 1 10 Staff obtains Programmer, systems, Records, Test Results / Process Orders []  - 0 Staff telephones HHA, Nursing Homes / Clarify orders / etc []  - 0 Routine Transfer to another Facility (non-emergent condition) []  - 0 Routine Hospital Admission (non-emergent condition) []  - 0 New Admissions / Biomedical engineer / Ordering NPWT, Apligraf, etc. []  - 0 Emergency Hospital Admission (emergent condition) X- 1 10 Simple Discharge Coordination Ransford, Letrice P. ([id number]) []  - 0 Complex (extensive) Discharge Coordination PROCESS - Special Needs []  - Pediatric / Minor Patient Management 0 []  - 0 Isolation Patient Management []  - 0 Hearing / Language / Visual special  needs []  - 0 Assessment of Community assistance (transportation, D/C planning, etc.) []  - 0 Additional assistance / Altered mentation []  - 0 Support Surface(s) Assessment (bed, cushion, seat, etc.)  INTERVENTIONS - Wound Cleansing / Measurement X - Simple Wound Cleansing - one wound 1 5 []  - 0 Complex Wound Cleansing - multiple wounds X- 1 5 Wound Imaging (photographs - any number of wounds) []  - 0 Wound Tracing (instead of photographs) X- 1 5 Simple Wound Measurement - one wound []  - 0 Complex Wound Measurement - multiple wounds INTERVENTIONS - Wound Dressings []  - Small Wound Dressing one or multiple wounds 0 X- 1 15 Medium Wound Dressing one or multiple wounds []  - 0 Large Wound Dressing one or multiple wounds X- 1 5 Application of Medications - topical []  - 0 Application of Medications - injection INTERVENTIONS - Miscellaneous []  - External ear exam 0 []  - 0 Specimen Collection (cultures, biopsies, blood, body fluids, etc.) []  - 0 Specimen(s) / Culture(s) sent or taken to Lab for analysis []  - 0 Patient Transfer (multiple staff / Civil Service fast streamer / Similar devices) []  - 0 Simple Staple / Suture removal (25 or less) []  - 0 Complex Staple / Suture removal (26 or more) []  - 0 Hypo / Hyperglycemic Management (close monitor of Blood Glucose) []  - 0 Ankle / Brachial Index (ABI) - do not check if billed separately X- 1 5 Vital Signs Valenta, Ashika P. ([id number]) Has the patient been seen at the hospital within the last three years: Yes Total Score: 105 Level Of Care: New/Established - Level 3 Electronic Signature(s) Signed: 04/21/2018 5:06:46 PM By: Montey Hora Entered By: Montey Hora on 04/21/2018 09:13:18 Naff, Lumina P. ([id number]) -------------------------------------------------------------------------------- Lower Extremity Assessment Details Patient Name: Kasa, Archana P. Date of Service: 04/21/2018 8:30 AM Medical Record Number: [id number] Patient  Account Number: [id number] Date of Birth/Sex: 03-May-1940 (78 y.o. F) Treating RN: Army Melia Primary Care Mahalia Dykes: Johny Drilling Other Clinician: Referring Kienan Doublin: Johny Drilling Treating Soraya Paquette/Extender: Melburn Hake, HOYT Weeks in Treatment: 6 Edema Assessment Assessed: [Left: No] [Right: No] [Left: Edema] [Right: :] Calf Left: Right: Point of Measurement: 34 cm From Medial Instep cm 40 cm Ankle Left: Right: Point of Measurement: 13 cm From Medial Instep cm 24 cm Vascular Assessment Pulses: Dorsalis Pedis Palpable: [Right:Yes] Extremity colors, hair growth, and conditions: Extremity Color: [Right:Red] Hair Growth on Extremity: [Right:No] Temperature of Extremity: [Right:Warm < 3 seconds] Toe Nail Assessment Left: Right: Thick: Yes Discolored: Yes Deformed: No Improper Length and Hygiene: No Electronic Signature(s) Signed: 04/21/2018 9:14:28 AM By: Army Melia Entered By: Army Melia on 04/21/2018 08:49:03 Denherder, Annete P. ([id number]) -------------------------------------------------------------------------------- Multi Wound Chart Details Patient Name: Lang, Kasidee P. Date of Service: 04/21/2018 8:30 AM Medical Record Number: [id number] Patient Account Number: [id number] Date of Birth/Sex: 19-Aug-1940 (77 y.o. F) Treating RN: Montey Hora Primary Care Mizani Dilday: Johny Drilling Other Clinician: Referring Dareth Andrew: Johny Drilling Treating Ashleyanne Hemmingway/Extender: Melburn Hake, HOYT Weeks in Treatment: 6 Vital Signs Height(in): 36 Pulse(bpm): 41 Weight(lbs): 216 Blood Pressure(mmHg): 182/97 Body Mass Index(BMI): 34 Temperature(F): 97.5 Respiratory Rate 16 (breaths/min): Photos: [5:No Photos] [N/A:N/A] Wound Location: [5:Right Lower Leg - Lateral] [N/A:N/A] Wounding Event: [5:Blister] [N/A:N/A] Primary Etiology: [5:Venous Leg Ulcer] [N/A:N/A] Comorbid History: [5:Cataracts, Angina, Coronary Artery Disease, Hypertension, Myocardial Infarction, Peripheral Venous  Disease, Type II Diabetes, Osteoarthritis, Neuropathy, Received Chemotherapy, Received Radiation] [N/A:N/A] Date Acquired: [5:04/09/2018] [N/A:N/A] Weeks of Treatment: [5:0] [N/A:N/A] Wound Status: [5:Open] [N/A:N/A] Measurements L x W x D [5:2x2.5x0.1] [N/A:N/A] (cm) Area (cm) : [5:3.927] [N/A:N/A] Volume (cm) : [5:0.393] [N/A:N/A] % Reduction in Area: [5:51.50%] [N/A:N/A] % Reduction in Volume: [5:51.50%] [N/A:N/A] Classification: [5:Partial Thickness] [N/A:N/A] Exudate Amount: [5:Medium] [N/A:N/A] Exudate Type: [5:Serous] [N/A:N/A] Exudate Color: [5:amber] [  N/A:N/A] Wound Margin: [5:Flat and Intact] [N/A:N/A] Granulation Amount: [5:Medium (34-66%)] [N/A:N/A] Granulation Quality: [5:Pink] [N/A:N/A] Necrotic Amount: [5:Small (1-33%)] [N/A:N/A] Exposed Structures: [5:Fascia: No Fat Layer (Subcutaneous Tissue) Exposed: No Tendon: No Muscle: No Joint: No Bone: No Limited to Skin Breakdown] [N/A:N/A] Epithelialization: Medium (34-66%) N/A N/A Wound Preparation: Ulcer Cleansing: Other: soap N/A N/A and water Topical Anesthetic Applied: Other: lidocaine 4% Treatment Notes Electronic Signature(s) Signed: 04/21/2018 5:06:46 PM By: Montey Hora Entered By: Montey Hora on 04/21/2018 09:02:14 Hartin, Willene Mamie Nick ([id number]) -------------------------------------------------------------------------------- [place] Details Patient Name: Gaymon, Dorota P. Date of Service: 04/21/2018 8:30 AM Medical Record Number: [id number] Patient Account Number: [id number] Date of Birth/Sex: Mar 11, 1940 (77 y.o. F) Treating RN: Montey Hora Primary Care Tranell Wojtkiewicz: Johny Drilling Other Clinician: Referring Arav Bannister: Johny Drilling Treating Berel Najjar/Extender: Melburn Hake, HOYT Weeks in Treatment: 6 Active Inactive Electronic Signature(s) Signed: 06/01/2018 11:57:33 AM By: Gretta Cool, BSN, RN, CWS, Kim RN, BSN Signed: 09/25/2018 1:04:33 PM By: Montey Hora Previous Signature: 04/21/2018  5:06:46 PM Version By: Montey Hora Entered By: Gretta Cool BSN, RN, CWS, Kim on 06/01/2018 11:57:32 Boullion, Cordella P. ([id number]) -------------------------------------------------------------------------------- Pain Assessment Details Patient Name: Pontius, Deanza P. Date of Service: 04/21/2018 8:30 AM Medical Record Number: [id number] Patient Account Number: [id number] Date of Birth/Sex: 1940/09/23 (77 y.o. F) Treating RN: Montey Hora Primary Care Suriyah Vergara: Johny Drilling Other Clinician: Referring Corvette Orser: Johny Drilling Treating Nieko Clarin/Extender: Melburn Hake, HOYT Weeks in Treatment: 6 Active Problems Location of Pain Severity and Description of Pain Patient Has Paino Yes Site Locations Rate the pain. Current Pain Level: 5 Pain Management and Medication Current Pain Management: Electronic Signature(s) Signed: 04/21/2018 4:07:26 PM By: Lorine Bears RCP, RRT, CHT Signed: 04/21/2018 5:06:46 PM By: Montey Hora Entered By: Lorine Bears on 04/21/2018 08:42:02 Thibeau, Aldora PMarland Kitchen ([id number]) -------------------------------------------------------------------------------- Patient/Caregiver Education Details Patient Name: Hafen, Eliya P. Date of Service: 04/21/2018 8:30 AM Medical Record Number: [id number] Patient Account Number: [id number] Date of Birth/Gender: 12-Jul-1940 (78 y.o. F) Treating RN: Montey Hora Primary Care Physician: Johny Drilling Other Clinician: Referring Physician: Johny Drilling Treating Physician/Extender: Sharalyn Ink in Treatment: 6 Education Assessment Education Provided To: Patient Education Topics Provided Wound/Skin Impairment: Handouts: Other: wound care as ordered Methods: Demonstration, Explain/Verbal Responses: State content correctly Electronic Signature(s) Signed: 04/21/2018 5:06:46 PM By: Montey Hora Entered By: Montey Hora on 04/21/2018 09:13:40 Hudspeth, Ellana P.  ([id number]) -------------------------------------------------------------------------------- Wound Assessment Details Patient Name: Helseth, Dayanis P. Date of Service: 04/21/2018 8:30 AM Medical Record Number: [id number] Patient Account Number: [id number] Date of Birth/Sex: 04-25-1940 (77 y.o. F) Treating RN: Army Melia Primary Care Jonathon Tan: Johny Drilling Other Clinician: Referring Abdoulaye Drum: Johny Drilling Treating Maryam Feely/Extender: Melburn Hake, HOYT Weeks in Treatment: 6 Wound Status Wound Number: 5 Primary Venous Leg Ulcer Etiology: Wound Location: Right Lower Leg - Lateral Wound Open Wounding Event: Blister Status: Date Acquired: 04/09/2018 Comorbid Cataracts, Angina, Coronary Artery Disease, Weeks Of Treatment: 0 History: Hypertension, Myocardial Infarction, Peripheral Clustered Wound: No Venous Disease, Type II Diabetes, Osteoarthritis, Neuropathy, Received Chemotherapy, Received Radiation Photos Photo Uploaded By: Army Melia on 04/21/2018 09:13:43 Wound Measurements Length: (cm) 2 Width: (cm) 2.5 Depth: (cm) 0.1 Area: (cm) 3.927 Volume: (cm) 0.393 % Reduction in Area: 51.5% % Reduction in Volume: 51.5% Epithelialization: Medium (34-66%) Tunneling: No Undermining: No Wound Description Classification: Partial Thickness Foul Odo Wound Margin: Flat and Intact Slough/F Exudate Amount: Medium Exudate Type: Serous Exudate Color: amber r After Cleansing: No ibrino No Wound Bed Granulation Amount: Medium (34-66%) Exposed Structure Granulation Quality: Pink Fascia Exposed: No Necrotic Amount:  Small (1-33%) Fat Layer (Subcutaneous Tissue) Exposed: No Necrotic Quality: Adherent Slough Tendon Exposed: No Muscle Exposed: No Joint Exposed: No Bone Exposed: No Born, Aaralynn P. ([id number]) Limited to Skin Breakdown Periwound Skin Texture Texture Color No Abnormalities Noted: No No Abnormalities Noted: No Callus: No Atrophie Blanche: No Crepitus:  No Cyanosis: No Excoriation: No Ecchymosis: No Induration: No Erythema: No Rash: No Hemosiderin Staining: No Scarring: No Mottled: No Pallor: No Moisture Rubor: No No Abnormalities Noted: No Dry / Scaly: No Temperature / Pain Maceration: No Temperature: No Abnormality Tenderness on Palpation: Yes Wound Preparation Ulcer Cleansing: Other: soap and water, Topical Anesthetic Applied: Other: lidocaine 4%, Electronic Signature(s) Signed: 04/21/2018 9:14:28 AM By: Army Melia Entered By: Army Melia on 04/21/2018 08:47:35 Stoker, Anishka P. ([id number]) -------------------------------------------------------------------------------- Vitals Details Patient Name: Damewood, Quanisha P. Date of Service: 04/21/2018 8:30 AM Medical Record Number: [id number] Patient Account Number: [id number] Date of Birth/Sex: 02/09/1940 (77 y.o. F) Treating RN: Montey Hora Primary Care Chancie Lampert: Johny Drilling Other Clinician: Referring Cambrea Kirt: Johny Drilling Treating Semaje Kinker/Extender: Melburn Hake, HOYT Weeks in Treatment: 6 Vital Signs Time Taken: 08:42 Temperature (F): 97.5 Height (in): 67 Pulse (bpm): 66 Weight (lbs): 216 Respiratory Rate (breaths/min): 16 Body Mass Index (BMI): 33.8 Blood Pressure (mmHg): 182/97 Reference Range: 80 - 120 mg / dl Notes Patient has not taken her blood pressure medication this morning. Electronic Signature(s) Signed: 04/21/2018 4:07:26 PM By: Lorine Bears RCP, RRT, CHT Entered By: Lorine Bears on 04/21/2018 08:43:21

## 2018-04-23 NOTE — Progress Notes (Signed)
SUPRINA, MANDEVILLE ([id number]) Visit Report for 04/21/2018 Chief Complaint Document Details Patient Name: Theresa [name], Theresa [name]. Date of Service: 04/21/2018 8:30 AM Medical Record Number: [id number] Patient Account Number: [id number] Date of Birth/Sex: Jul 21, 1940 (77 y.o. F) Treating RN: Montey Hora Primary Care Provider: Johny Drilling Other Clinician: Referring Provider: Johny Drilling Treating Provider/Extender: Melburn Hake, Glorian Mcdonell Weeks in Treatment: 6 Information Obtained from: Patient Chief Complaint Bilateral LE ulcers Electronic Signature(s) Signed: 04/22/2018 11:01:46 PM By: Worthy Keeler PA-C Entered By: Worthy Keeler on 04/21/2018 08:58:29 Bohnsack, Karlissa P. ([id number]) -------------------------------------------------------------------------------- HPI Details Patient Name: [name], [name] Date of Service: 04/21/2018 8:30 AM Medical Record Number: [id number] Patient Account Number: [id number] Date of Birth/Sex: 1940/05/01 (77 y.o. F) Treating RN: Montey Hora Primary Care Provider: Johny Drilling Other Clinician: Referring Provider: Johny Drilling Treating Provider/Extender: Melburn Hake, Glenola Wheat Weeks in Treatment: 6 History of Present Illness HPI Description: 03/09/18 on evaluation today patient presents for initial inspection in our clinic concerning issues that she has been having with bilateral lower extremity ulcers. She has lymphedema as well as chronic venous stasis. She has seen Dr. dew where she's undergone ABI testing with a left ABI 1.28 a right ABI 1.22. Subsequently she is supposed to be undergoing venous studies as well which are upcoming. She does have a history of diabetes, chronic venous stasis, peripheral vascular disease, and hypertension. Currently these wounds which are currently draining are stated to have been open for roughly 2 weeks or so. It does not appear to be any evidence of infection. No fevers, chills, nausea, or vomiting noted at this time. With  that being said the patient does have some prominence to the ankle medially especially on the left side and I'm not sure if something was rubbing which calls this issue either. She is unable to get around and move very well simply due to the fact that she has an issue with her right hip as well which unfortunately prevents her from being able to move around very much. She did have an [place] placed at the request of her daughter when she was at vascular this seems to may be potentially helped a little bit. 03/16/18 on evaluation today patient's right lower extremity appears to likely be completely healed although there still small area of concern which I'm gonna continue to watch. The really does not appear to be any weeping at this point which is good news. Overall I feel like she is done excellent. In regard to left lower extremity this is a little larger still than the right although again the swelling is indeed down. The wound over the media malleolus does appear to be doing better which is good news. No fevers, chills, nausea, or vomiting noted at this time. Unfortunately the patient still continues to have right hip pain which is getting more severe. Again her surgeon did not want to undergo surgery until everything was healed as far as the wounds of the right lower extremity. 03/23/18 on evaluation today patient actually appears to be doing excellent in regard to her right lower extremity. There are no open wounds at this point in time and there is no weeping of drainage the compression wraps appear to have done very well for her. She did get the FarrowWrap 4000 Velcro compression wraps which we are gonna initiate treatment with today and this should help continue to keep swelling under control as well is the weeping. Again everything appears to be doing great in regard to the right lower  extremity. In regard to left lower extremity everything is pretty much close there are no  significant open wounds the only actual open wound that she has is on the medial left ankle which again is the opposite side from where her hip issue is and this is very superficial and shows no signs of infection at this point. 04/02/18 on evaluation today patient actually presents for an early appointment after I saw her last week due to several areas the left lower extremity which had reopened causing some issues at this point. Fortunately there does not appear to be any evidence of infection at this time. With that being said she again is waiting on everything to be better in order to be able to proceed with the surgery for her right hip as previously suggested. 04/09/18 on evaluation today patient actually appears to be doing better in regard to her left lower surety ulcers which is great news. Unfortunately she continues to have issues even on the right side there are no open wounds that she does have a blister that arose on the right at this point as well. Fortunately there's no evidence of infection currently. No fevers, chills, nausea, or vomiting noted at this time. 04/16/18 on evaluation today patient actually appears to be doing very well in regard to her left lower extremity all the wounds appear to be completely healed including the left medial malleolus which was of greatest concern. The only if and when she has some regard to the right lower extremity where there is a very superficial area of blistering which is open at this point. Fortunately there's no signs of active infection otherwise. Overall I feel like she would be very safe to proceed with that surgery at this time. 04/21/18 on evaluation today patient presents for early follow-up she was supposed to be here in two days but nonetheless she is here sooner due to the fact that she's been having issues with her right wrap which is actually causing her a lot of discomfort. She states her leg has been more swollen and painful  something she really has not experienced in recent weeks. River, Mckercher Kasidy P. ([id number]) Again this leg has been doing very well there's just a small blister that we were trading on the lateral aspect of her right leg. Again the left leg has pretty much completely resolved. Fortunately upon inspection today there does not appear to be any evidence of active infection systemically. With that being said I'm questioning whether or not she may have some cellulitis around the right lower extremity especially with the wrap somewhat pushed down and I think she may be scratching at this area. Electronic Signature(s) Signed: 04/22/2018 11:01:46 PM By: Worthy Keeler PA-C Entered By: Worthy Keeler on 04/22/2018 22:14:47 Mihalko, Takyla Mamie Nick ([id number]) -------------------------------------------------------------------------------- Physical Exam Details Patient Name: Sausedo, Skila P. Date of Service: 04/21/2018 8:30 AM Medical Record Number: [id number] Patient Account Number: [id number] Date of Birth/Sex: Feb 11, 1940 (77 y.o. F) Treating RN: Montey Hora Primary Care Provider: Johny Drilling Other Clinician: Referring Provider: Johny Drilling Treating Provider/Extender: Melburn Hake, Jillayne Witte Weeks in Treatment: 6 Constitutional Well-nourished and well-hydrated in no acute distress. Respiratory normal breathing without difficulty. clear to auscultation bilaterally. Cardiovascular regular rate and rhythm with normal S1, S2. Psychiatric this patient is able to make decisions and demonstrates good insight into disease process. Alert and Oriented x 3. pleasant and cooperative. Notes On inspection today patient's wound on the right lower extremity laterally actually appears to be significantly smaller  and is doing excellent. In regard to the new area which is scratched at the top of the rack were got pushed down due to being so tight this actually appears to show signs of honey crusted drainage dried  externally. That has me concerned about the possibility of a staff infection/impetigo. Nonetheless overall I do feel like that the patient seems to be not doing too poorly and I'm hopeful we can get this of control very quickly. Electronic Signature(s) Signed: 04/22/2018 11:01:46 PM By: Worthy Keeler PA-C Entered By: Worthy Keeler on 04/22/2018 22:16:00 Haskell, Kemiah Mamie Nick ([id number]) -------------------------------------------------------------------------------- Physician Orders Details Patient Name: Argyle, Denea P. Date of Service: 04/21/2018 8:30 AM Medical Record Number: [id number] Patient Account Number: [id number] Date of Birth/Sex: 1940/07/04 (77 y.o. F) Treating RN: Montey Hora Primary Care Provider: Johny Drilling Other Clinician: Referring Provider: Johny Drilling Treating Provider/Extender: Melburn Hake, Lilliann Rossetti Weeks in Treatment: 6 Verbal / Phone Orders: No Diagnosis Coding ICD-10 Coding Code Description E11.622 Type 2 diabetes mellitus with other skin ulcer I87.2 Venous insufficiency (chronic) (peripheral) L97.322 Non-pressure chronic ulcer of left ankle with fat layer exposed L97.812 Non-pressure chronic ulcer of other part of right lower leg with fat layer exposed I73.89 Other specified peripheral vascular diseases I10 Essential (primary) hypertension Wound Cleansing Wound #5 Right,Lateral Lower Leg o Clean wound with Normal Saline. Primary Wound Dressing Wound #5 Right,Lateral Lower Leg o Silver Alginate o Other: - mupirocin ointment Secondary Dressing Wound #5 Right,Lateral Lower Leg o ABD pad o Conform/Kerlix Dressing Change Frequency Wound #5 Right,Lateral Lower Leg o Change dressing every day. Follow-up Appointments o Return Appointment in 1 week. Edema Control Wound #5 Right,Lateral Lower Leg o Patient to wear own Velcro compression garment. Patient Medications Allergies: codeine, morphine HCl Notifications Medication Indication  Start End Bactrim DS 04/21/2018 DOSE 1 - oral 800 mg-160 mg tablet - 1 tablet oral taken 2 times a day for 10 days mupirocin 04/21/2018 Hopping, Yasuko P. ([id number]) Notifications Medication Indication Start End DOSE topical 2 % ointment - ointment topical applied daily to the wound beds under the dressing as directed until healed Electronic Signature(s) Signed: 04/21/2018 9:14:59 AM By: Worthy Keeler PA-C Entered By: Worthy Keeler on 04/21/2018 09:14:59 Diniz, Nadyne P. ([id number]) -------------------------------------------------------------------------------- Problem List Details Patient Name: Drees, Alia P. Date of Service: 04/21/2018 8:30 AM Medical Record Number: [id number] Patient Account Number: [id number] Date of Birth/Sex: [birth year] (77 y.o. F) Treating RN: Montey Hora Primary Care Provider: Johny Drilling Other Clinician: Referring Provider: Johny Drilling Treating Provider/Extender: Melburn Hake, Makell Drohan Weeks in Treatment: 6 Active Problems ICD-10 Evaluated Encounter Code Description Active Date Today Diagnosis E11.622 Type 2 diabetes mellitus with other skin ulcer 03/09/2018 No Yes I87.2 Venous insufficiency (chronic) (peripheral) 03/09/2018 No Yes L97.322 Non-pressure chronic ulcer of left ankle with fat layer 03/09/2018 No Yes exposed L97.812 Non-pressure chronic ulcer of other part of right lower leg 03/09/2018 No Yes with fat layer exposed I73.89 Other specified peripheral vascular diseases 03/09/2018 No Yes I10 Essential (primary) hypertension 03/09/2018 No Yes Inactive Problems Resolved Problems Electronic Signature(s) Signed: 04/22/2018 11:01:46 PM By: Worthy Keeler PA-C Entered By: Worthy Keeler on 04/21/2018 08:57:54 Davanzo, Octavie P. ([id number]) -------------------------------------------------------------------------------- Progress Note Details Patient Name: Larios, Lorena P. Date of Service: 04/21/2018 8:30 AM Medical Record Number:  [id number] Patient Account Number: [id number] Date of Birth/Sex: October 30, 1940 (77 y.o. F) Treating RN: Montey Hora Primary Care Provider: Johny Drilling Other Clinician: Referring Provider: Johny Drilling Treating Provider/Extender: Melburn Hake, Randeep Biondolillo Weeks in Treatment: 6 Subjective  Chief Complaint Information obtained from Patient Bilateral LE ulcers History of Present Illness (HPI) 03/09/18 on evaluation today patient presents for initial inspection in our clinic concerning issues that she has been having with bilateral lower extremity ulcers. She has lymphedema as well as chronic venous stasis. She has seen Dr. dew where she's undergone ABI testing with a left ABI 1.28 a right ABI 1.22. Subsequently she is supposed to be undergoing venous studies as well which are upcoming. She does have a history of diabetes, chronic venous stasis, peripheral vascular disease, and hypertension. Currently these wounds which are currently draining are stated to have been open for roughly 2 weeks or so. It does not appear to be any evidence of infection. No fevers, chills, nausea, or vomiting noted at this time. With that being said the patient does have some prominence to the ankle medially especially on the left side and I'm not sure if something was rubbing which calls this issue either. She is unable to get around and move very well simply due to the fact that she has an issue with her right hip as well which unfortunately prevents her from being able to move around very much. She did have an [place] placed at the request of her daughter when she was at vascular this seems to may be potentially helped a little bit. 03/16/18 on evaluation today patient's right lower extremity appears to likely be completely healed although there still small area of concern which I'm gonna continue to watch. The really does not appear to be any weeping at this point which is good news. Overall I feel like she is done excellent.  In regard to left lower extremity this is a little larger still than the right although again the swelling is indeed down. The wound over the media malleolus does appear to be doing better which is good news. No fevers, chills, nausea, or vomiting noted at this time. Unfortunately the patient still continues to have right hip pain which is getting more severe. Again her surgeon did not want to undergo surgery until everything was healed as far as the wounds of the right lower extremity. 03/23/18 on evaluation today patient actually appears to be doing excellent in regard to her right lower extremity. There are no open wounds at this point in time and there is no weeping of drainage the compression wraps appear to have done very well for her. She did get the FarrowWrap 4000 Velcro compression wraps which we are gonna initiate treatment with today and this should help continue to keep swelling under control as well is the weeping. Again everything appears to be doing great in regard to the right lower extremity. In regard to left lower extremity everything is pretty much close there are no significant open wounds the only actual open wound that she has is on the medial left ankle which again is the opposite side from where her hip issue is and this is very superficial and shows no signs of infection at this point. 04/02/18 on evaluation today patient actually presents for an early appointment after I saw her last week due to several areas the left lower extremity which had reopened causing some issues at this point. Fortunately there does not appear to be any evidence of infection at this time. With that being said she again is waiting on everything to be better in order to be able to proceed with the surgery for her right hip as previously suggested. 04/09/18 on evaluation  today patient actually appears to be doing better in regard to her left lower surety ulcers which is great news. Unfortunately she  continues to have issues even on the right side there are no open wounds that she does have a blister that arose on the right at this point as well. Fortunately there's no evidence of infection currently. No fevers, chills, nausea, or vomiting noted at this time. 04/16/18 on evaluation today patient actually appears to be doing very well in regard to her left lower extremity all the wounds appear to be completely healed including the left medial malleolus which was of greatest concern. The only if and when she has some regard to the right lower extremity where there is a very superficial area of blistering which is open at this point. Fortunately there's no signs of active infection otherwise. Overall I feel like she would be very safe to proceed with that Bondy, Muntaha P. ([id number]) surgery at this time. 04/21/18 on evaluation today patient presents for early follow-up she was supposed to be here in two days but nonetheless she is here sooner due to the fact that she's been having issues with her right wrap which is actually causing her a lot of discomfort. She states her leg has been more swollen and painful something she really has not experienced in recent weeks. Again this leg has been doing very well there's just a small blister that we were trading on the lateral aspect of her right leg. Again the left leg has pretty much completely resolved. Fortunately upon inspection today there does not appear to be any evidence of active infection systemically. With that being said I'm questioning whether or not she may have some cellulitis around the right lower extremity especially with the wrap somewhat pushed down and I think she may be scratching at this area. Patient History Information obtained from Patient. Social History Never smoker, Marital Status - Married, Alcohol Use - Never, Drug Use - No History, Caffeine Use - Never. Medical History Eyes Patient has history of Cataracts -  removed Denies history of Glaucoma, Optic Neuritis Ear/Nose/Mouth/Throat Denies history of Chronic sinus problems/congestion, Middle ear problems Hematologic/Lymphatic Denies history of Anemia, Hemophilia, Human Immunodeficiency Virus, Lymphedema, Sickle Cell Disease Respiratory Denies history of Aspiration, Asthma, Chronic Obstructive Pulmonary Disease (COPD), Pneumothorax, Sleep Apnea, Tuberculosis Cardiovascular Patient has history of Angina, Coronary Artery Disease, Hypertension, Myocardial Infarction - more than 5 years ago, Peripheral Venous Disease Denies history of Arrhythmia, Congestive Heart Failure, Deep Vein Thrombosis, Hypotension, Peripheral Arterial Disease, Phlebitis, Vasculitis Gastrointestinal Denies history of Cirrhosis , Colitis, Crohn s, Hepatitis A, Hepatitis B, Hepatitis C Endocrine Patient has history of Type II Diabetes Denies history of Type I Diabetes Genitourinary Denies history of End Stage Renal Disease Immunological Denies history of Lupus Erythematosus, Raynaud s, Scleroderma Integumentary (Skin) Denies history of History of Burn, History of pressure wounds Musculoskeletal Patient has history of Osteoarthritis Denies history of Gout, Rheumatoid Arthritis, Osteomyelitis Neurologic Patient has history of Neuropathy Denies history of Dementia, Quadriplegia, Paraplegia, Seizure Disorder Oncologic Patient has history of Received Chemotherapy, Received Radiation Psychiatric Denies history of Anorexia/bulimia, Confinement Anxiety Medical And Surgical History Notes Genitourinary CKD stage 3 Oncologic ABRIEL, HATTERY. ([id number]) 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 Review of Systems (ROS) Constitutional Symptoms (General Health) Denies complaints or symptoms of Fever, Chills. Respiratory The patient has no complaints or symptoms. Cardiovascular The patient has no complaints or symptoms. Psychiatric The patient has no  complaints or symptoms.  Objective Constitutional Well-nourished and well-hydrated in no acute distress. Vitals Time Taken: 8:42 AM, Height: 67 in, Weight: 216 lbs, BMI: 33.8, Temperature: 97.5 F, Pulse: 66 bpm, Respiratory Rate: 16 breaths/min, Blood Pressure: 182/97 mmHg. General Notes: Patient has not taken her blood pressure medication this morning. Respiratory normal breathing without difficulty. clear to auscultation bilaterally. Cardiovascular regular rate and rhythm with normal S1, S2. Psychiatric this patient is able to make decisions and demonstrates good insight into disease process. Alert and Oriented x 3. pleasant and cooperative. General Notes: On inspection today patient's wound on the right lower extremity laterally actually appears to be significantly smaller and is doing excellent. In regard to the new area which is scratched at the top of the rack were got pushed down due to being so tight this actually appears to show signs of honey crusted drainage dried externally. That has me concerned about the possibility of a staff infection/impetigo. Nonetheless overall I do feel like that the patient seems to be not doing too poorly and I'm hopeful we can get this of control very quickly. Integumentary (Hair, Skin) Wound #5 status is Open. Original cause of wound was Blister. The wound is located on the Right,Lateral Lower Leg. The wound measures 2cm length x 2.5cm width x 0.1cm depth; 3.927cm^2 area and 0.393cm^3 volume. The wound is limited to skin breakdown. There is no tunneling or undermining noted. There is a medium amount of serous drainage noted. The wound margin is flat and intact. There is medium (34-66%) pink granulation within the wound bed. There is a small (1-33%) amount of necrotic tissue within the wound bed including Adherent Slough. The periwound skin appearance did not exhibit: Callus, Crepitus, Excoriation, Induration, Rash, Scarring, Dry/Scaly, Maceration,  Atrophie Blanche, Cyanosis, Ecchymosis, Hemosiderin Staining, Mottled, Pallor, Rubor, Erythema. Periwound temperature was noted as No Abnormality. The periwound has tenderness on palpation. 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 Primary Wound Dressing: Wound #5 Right,Lateral Lower Leg: Silver Alginate Other: - mupirocin ointment Secondary Dressing: Wound #5 Right,Lateral Lower Leg: ABD pad Conform/Kerlix Dressing Change Frequency: Wound #5 Right,Lateral Lower Leg: Change dressing every day. Follow-up Appointments: Return Appointment in 1 week. Edema Control: Wound #5 Right,Lateral Lower Leg: Patient to wear own Velcro compression garment. The following medication(s) was prescribed: Bactrim DS oral 800 mg-160 mg tablet 1 1 tablet oral taken 2 times a day for 10 days starting 04/21/2018 mupirocin topical 2 % ointment ointment topical applied daily to the wound beds under the dressing as directed until healed starting 04/21/2018 At this point I'm gonna initiate treatment with Bactroban ointment topically to the open wound areas we will continue with some silver alginate of the top of this. Subsequently I'm also gonna go ahead and send in a prescription for an antibiotic I originally sent them Bactrim for the patient after questioning her thoroughly about her allergies. With that being said I think that a call later from the pharmacist stating that patient had listed a sulfa allergy on her record there at the pharmacy. For that reason I did discuss with the  pharmacist that when she comes in to ask her whether or not this is truly an  allergy that she has and if it really is then she will need to have Keflex instead that did get the order for Keflex to be taken 500 mg three times a day for 10 days if it needs to be switched. The pharmacist at this down as a backup prescription. I spoke with Marlowe Kays at the pharmacy. Otherwise we're gonna see were things stand at follow-up. Canizales, Elizabethann P. ([id number]) Please see above for specific wound care orders. We will see patient for re-evaluation in 1 week(s) here in the clinic. If anything worsens or changes patient will contact our office for additional recommendations. Electronic Signature(s) Signed: 04/22/2018 11:01:46 PM By: Worthy Keeler PA-C Entered By: Worthy Keeler on 04/22/2018 22:16:17 Coop, Chad PMarland Kitchen ([id number]) -------------------------------------------------------------------------------- ROS/PFSH Details Patient Name: Topper, Reis P. Date of Service: 04/21/2018 8:30 AM Medical Record Number: [id number] Patient Account Number: [id number] Date of Birth/Sex: 08-Sep-1940 (77 y.o. F) Treating RN: Montey Hora Primary Care Provider: Johny Drilling Other Clinician: Referring Provider: Johny Drilling Treating Provider/Extender: Melburn Hake, Madison Albea Weeks in Treatment: 6 Information Obtained From Patient Wound History Do you currently have one or more open woundso Yes How many open wounds do you currently haveo 3 Approximately how long have you had your woundso months How have you been treating your wound(s) until nowo unna boots Has your wound(s) ever healed and then re-openedo No Have you had any lab work done in the past montho No Have you tested positive for an antibiotic resistant organism (MRSA, VRE)o No Have you tested positive for osteomyelitis (bone infection)o No Have you had any tests for circulation on your legso No Have you had other problems associated with your woundso  Swelling Constitutional Symptoms (General Health) Complaints and Symptoms: Negative for: Fever; Chills Eyes Medical History: Positive for: Cataracts - removed Negative for: Glaucoma; Optic Neuritis Ear/Nose/Mouth/Throat Medical History: Negative for: Chronic sinus problems/congestion; Middle ear problems Hematologic/Lymphatic Medical History: Negative for: Anemia; Hemophilia; Human Immunodeficiency Virus; Lymphedema; Sickle Cell Disease Respiratory Complaints and Symptoms: No Complaints or Symptoms Medical History: Negative for: Aspiration; Asthma; Chronic Obstructive Pulmonary Disease (COPD); Pneumothorax; Sleep Apnea; Tuberculosis Cardiovascular Complaints and Symptoms: No Complaints or Symptoms Tusing, Glenis P. ([id number]) Medical History: Positive for: Angina; Coronary Artery Disease; Hypertension; Myocardial Infarction - more than 5 years ago; Peripheral Venous Disease Negative for: Arrhythmia; Congestive Heart Failure; Deep Vein Thrombosis; Hypotension; Peripheral Arterial Disease; Phlebitis; Vasculitis Gastrointestinal Medical History: Negative for: Cirrhosis ; Colitis; Crohnos; Hepatitis A; Hepatitis B; Hepatitis C Endocrine Medical History: Positive for: Type II Diabetes Negative for: Type I Diabetes Genitourinary Medical History: Negative for: End Stage Renal Disease Past Medical History Notes: CKD stage 3 Immunological Medical History: Negative for: Lupus Erythematosus; Raynaudos; Scleroderma Integumentary (Skin) Medical History: Negative for: History of Burn; History of pressure wounds Musculoskeletal Medical History: Positive for: Osteoarthritis Negative for: Gout; Rheumatoid Arthritis; Osteomyelitis Neurologic Medical History: Positive for: Neuropathy Negative for: Dementia; Quadriplegia; Paraplegia; Seizure Disorder Oncologic Medical History: Positive for: Received Chemotherapy; Received Radiation Past Medical History Notes: 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 Psychiatric Complaints and Symptoms: No Complaints or Symptoms Medical History: GEARLDINE, LOONEY ([id number]) Negative for: Anorexia/bulimia; Confinement Anxiety HBO Extended History Items Eyes: Cataracts Immunizations Pneumococcal Vaccine: Received Pneumococcal Vaccination: Yes Implantable Devices No devices added Family and Social History Never smoker; Marital Status - Married; Alcohol Use: Never; Drug Use: No History; Caffeine Use: Never; Financial Concerns: No; Food, Clothing or Shelter Needs: No; Support System Lacking: No; Transportation Concerns: No; Advanced Directives: No;  Patient does not want information on Advanced Directives Physician Affirmation I have reviewed and agree with the above information. Electronic Signature(s) Signed: 04/22/2018 11:01:46 PM By: Worthy Keeler PA-C Signed: 04/23/2018 10:13:10 AM By: Montey Hora Entered By: Worthy Keeler on 04/22/2018 22:15:01 Devoto, Gale P. ([id number]) -------------------------------------------------------------------------------- SuperBill Details Patient Name: Soley, Darrelyn P. Date of Service: 04/21/2018 Medical Record Number: [id number] Patient Account Number: [id number] Date of Birth/Sex: [birth year] (78 y.o. 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 I73.89 Other specified peripheral vascular diseases I10 Essential (primary) hypertension Facility Procedures CPT4 Code: 60630160 Description: 99213 - WOUND  CARE VISIT-LEV 3 EST PT Modifier: Quantity: 1 Physician Procedures CPT4 Code Description: 1093235 57322 - WC PHYS LEVEL 4 - EST PT ICD-10 Diagnosis Description E11.622 Type 2 diabetes mellitus with other skin ulcer I87.2 Venous insufficiency (chronic) (peripheral) L97.322 Non-pressure chronic ulcer of left ankle with  fat layer expose L97.812 Non-pressure chronic ulcer of other part of right lower leg wi Modifier: d th fat layer expo Quantity: 1 sed Electronic Signature(s) Signed: 04/22/2018 11:01:46 PM By: Worthy Keeler PA-C Entered By: Worthy Keeler on 04/22/2018 22:16:29

## 2018-04-28 ENCOUNTER — Ambulatory Visit: Payer: Medicare HMO | Admitting: Physician Assistant

## 2018-06-03 ENCOUNTER — Other Ambulatory Visit (HOSPITAL_COMMUNITY): Payer: Self-pay | Admitting: Orthopedic Surgery

## 2018-06-03 ENCOUNTER — Other Ambulatory Visit: Payer: Self-pay | Admitting: Orthopedic Surgery

## 2018-06-03 DIAGNOSIS — M87051 Idiopathic aseptic necrosis of right femur: Secondary | ICD-10-CM

## 2018-06-03 DIAGNOSIS — M1611 Unilateral primary osteoarthritis, right hip: Secondary | ICD-10-CM

## 2018-06-03 DIAGNOSIS — M25551 Pain in right hip: Secondary | ICD-10-CM

## 2018-06-09 ENCOUNTER — Other Ambulatory Visit: Payer: Self-pay

## 2018-06-09 ENCOUNTER — Ambulatory Visit
Admission: RE | Admit: 2018-06-09 | Discharge: 2018-06-09 | Disposition: A | Discharge status: Home / Self Care | Payer: Medicare HMO | Source: Ambulatory Visit | Attending: Orthopedic Surgery | Admitting: Orthopedic Surgery

## 2018-06-09 DIAGNOSIS — M87051 Idiopathic aseptic necrosis of right femur: Secondary | ICD-10-CM | POA: Diagnosis present

## 2018-06-09 DIAGNOSIS — M1611 Unilateral primary osteoarthritis, right hip: Secondary | ICD-10-CM | POA: Diagnosis present

## 2018-06-09 DIAGNOSIS — M25551 Pain in right hip: Secondary | ICD-10-CM | POA: Diagnosis present

## 2018-07-03 ENCOUNTER — Ambulatory Visit: Payer: Medicare HMO | Admitting: Physician Assistant

## 2018-07-10 ENCOUNTER — Encounter: Payer: Medicare HMO | Attending: Physician Assistant | Admitting: Physician Assistant

## 2018-07-10 ENCOUNTER — Other Ambulatory Visit: Payer: Self-pay

## 2018-07-10 DIAGNOSIS — L97822 Non-pressure chronic ulcer of other part of left lower leg with fat layer exposed: Secondary | ICD-10-CM | POA: Diagnosis not present

## 2018-07-10 DIAGNOSIS — E1140 Type 2 diabetes mellitus with diabetic neuropathy, unspecified: Secondary | ICD-10-CM | POA: Insufficient documentation

## 2018-07-10 DIAGNOSIS — Z8542 Personal history of malignant neoplasm of other parts of uterus: Secondary | ICD-10-CM | POA: Diagnosis not present

## 2018-07-10 DIAGNOSIS — Z853 Personal history of malignant neoplasm of breast: Secondary | ICD-10-CM | POA: Diagnosis not present

## 2018-07-10 DIAGNOSIS — I890 Lymphedema, not elsewhere classified: Secondary | ICD-10-CM | POA: Insufficient documentation

## 2018-07-10 DIAGNOSIS — Z881 Allergy status to other antibiotic agents status: Secondary | ICD-10-CM | POA: Insufficient documentation

## 2018-07-10 DIAGNOSIS — E1151 Type 2 diabetes mellitus with diabetic peripheral angiopathy without gangrene: Secondary | ICD-10-CM | POA: Diagnosis not present

## 2018-07-10 DIAGNOSIS — I2510 Atherosclerotic heart disease of native coronary artery without angina pectoris: Secondary | ICD-10-CM | POA: Diagnosis not present

## 2018-07-10 DIAGNOSIS — N183 Chronic kidney disease, stage 3 (moderate): Secondary | ICD-10-CM | POA: Insufficient documentation

## 2018-07-10 DIAGNOSIS — I252 Old myocardial infarction: Secondary | ICD-10-CM | POA: Diagnosis not present

## 2018-07-10 DIAGNOSIS — Z885 Allergy status to narcotic agent status: Secondary | ICD-10-CM | POA: Insufficient documentation

## 2018-07-10 DIAGNOSIS — I129 Hypertensive chronic kidney disease with stage 1 through stage 4 chronic kidney disease, or unspecified chronic kidney disease: Secondary | ICD-10-CM | POA: Insufficient documentation

## 2018-07-10 DIAGNOSIS — E1122 Type 2 diabetes mellitus with diabetic chronic kidney disease: Secondary | ICD-10-CM | POA: Diagnosis not present

## 2018-07-10 DIAGNOSIS — E11622 Type 2 diabetes mellitus with other skin ulcer: Secondary | ICD-10-CM | POA: Diagnosis not present

## 2018-07-10 DIAGNOSIS — I878 Other specified disorders of veins: Secondary | ICD-10-CM | POA: Insufficient documentation

## 2018-07-10 DIAGNOSIS — E11621 Type 2 diabetes mellitus with foot ulcer: Secondary | ICD-10-CM | POA: Insufficient documentation

## 2018-07-10 DIAGNOSIS — M1990 Unspecified osteoarthritis, unspecified site: Secondary | ICD-10-CM | POA: Diagnosis not present

## 2018-07-10 NOTE — Progress Notes (Signed)
CHANTERIA, HAGGARD ([id number]) Visit Report for 07/10/2018 Abuse/Suicide Risk Screen Details Patient Name: Theresa [name], [name] Date of Service: 07/10/2018 1:15 PM Medical Record Number: [id number] Patient Account Number: [id number] Date of Birth/Sex: 1940-12-02 (78 y.o. F) Treating RN: Harold Barban Primary Care Antwon Rochin: Johny Drilling Other Clinician: Referring Parminder Cupples: Johny Drilling Treating Hollie Wojahn/Extender: Melburn Hake, HOYT Weeks in Treatment: 0 Abuse/Suicide Risk Screen Items Answer ABUSE RISK SCREEN: Has anyone close to you tried to hurt or harm you recentlyo No Do you feel uncomfortable with anyone in your familyo No Has anyone forced you do things that you didnot want to doo No Electronic Signature(s) Signed: 07/10/2018 4:12:52 PM By: Harold Barban Entered By: Harold Barban on 07/10/2018 13:42:27 Abascal, Ayaan P. ([id number]) -------------------------------------------------------------------------------- Activities of Daily Living Details Patient Name: [name], [name] Date of Service: 07/10/2018 1:15 PM Medical Record Number: [id number] Patient Account Number: [id number] Date of Birth/Sex: 12-03-1940 (78 y.o. F) Treating RN: Harold Barban Primary Care Maverick Patman: Johny Drilling Other Clinician: Referring Tipton Ballow: Johny Drilling Treating Ellwood Steidle/Extender: Melburn Hake, HOYT Weeks in Treatment: 0 Activities of Daily Living Items Answer Activities of Daily Living (Please select one for each item) Drive Automobile Not Able Take Medications Need Assistance Use Telephone Completely Able Care for Appearance Need Assistance Use Toilet Need Assistance Bath / Shower Need Assistance Dress Self Need Assistance Feed Self Completely Able Walk Not Able Get In / Out Bed Need Assistance Housework Not Able Prepare Meals Not Able Handle Money Need Assistance Shop for Self Not Able Electronic Signature(s) Signed: 07/10/2018 4:12:52 PM By: Harold Barban Entered By: Harold Barban on 07/10/2018 13:43:29 Wynn, Abel P. ([id number]) -------------------------------------------------------------------------------- Education Screening Details Patient Name: [name], [name] Date of Service: 07/10/2018 1:15 PM Medical Record Number: [id number] Patient Account Number: [id number] Date of Birth/Sex: 11/01/1940 (78 y.o. F) Treating RN: Harold Barban Primary Care Forrest Demuro: Johny Drilling Other Clinician: Referring Faige Seely: Johny Drilling Treating Lior Hoen/Extender: Melburn Hake, HOYT Weeks in Treatment: 0 Primary Learner Assessed: Patient Learning Preferences/Education Level/Primary Language Learning Preference: Explanation Highest Education Level: High School Preferred Language: English Cognitive Barrier Language Barrier: No Translator Needed: No Memory Deficit: No Emotional Barrier: No Cultural/Religious Beliefs Affecting Medical Care: No Physical Barrier Impaired Vision: No Impaired Hearing: No Decreased Hand dexterity: No Knowledge/Comprehension Knowledge Level: High Comprehension Level: High Ability to understand written High instructions: Ability to understand verbal High instructions: Motivation Anxiety Level: Calm Cooperation: Cooperative Education Importance: Acknowledges Need Interest in Health Problems: Asks Questions Perception: Coherent Willingness to Engage in Self- High Management Activities: Readiness to Engage in Self- High Management Activities: Electronic Signature(s) Signed: 07/10/2018 4:12:52 PM By: Harold Barban Entered By: Harold Barban on 07/10/2018 13:43:50 Monts, Anysha P. ([id number]) -------------------------------------------------------------------------------- Fall Risk Assessment Details Patient Name: [name], [name] Date of Service: 07/10/2018 1:15 PM Medical Record Number: [id number] Patient Account Number: [id number] Date of Birth/Sex: [birth year] (78 y.o. F) Treating RN: Harold Barban Primary  Care Graceland Wachter: Johny Drilling Other Clinician: Referring Sherylann Vangorden: Johny Drilling Treating Naraly Fritcher/Extender: Melburn Hake, HOYT Weeks in Treatment: 0 Fall Risk Assessment Items Have you had 2 or more falls in the last 12 monthso 0 Yes Have you had any fall that resulted in injury in the last 12 monthso 0 No FALLS RISK SCREEN History of falling - immediate or within 3 months 25 Yes Secondary diagnosis (Do you have 2 or more medical diagnoseso) 0 No Ambulatory aid None/bed rest/wheelchair/nurse 0 No Crutches/cane/walker 0 No Furniture 30 Yes Intravenous therapy Access/Saline/Heparin Lock 0 No Gait/Transferring Normal/ bed rest/ wheelchair 0 No Weak (short  steps with or without shuffle, stooped but able to lift head while 0 No walking, may seek support from furniture) Impaired (short steps with shuffle, may have difficulty arising from chair, head 20 Yes down, impaired balance) Mental Status Oriented to own ability 0 No Electronic Signature(s) Signed: 07/10/2018 4:12:52 PM By: Harold Barban Entered By: Harold Barban on 07/10/2018 13:44:42 Yeldell, Kiva P. ([id number]) -------------------------------------------------------------------------------- Foot Assessment Details Patient Name: [name], [name] Date of Service: 07/10/2018 1:15 PM Medical Record Number: [id number] Patient Account Number: [id number] Date of Birth/Sex: 03-20-1940 (78 y.o. F) Treating RN: Harold Barban Primary Care Venus Ruhe: Johny Drilling Other Clinician: Referring Laura-Lee Villegas: Johny Drilling Treating Aaden Buckman/Extender: Melburn Hake, HOYT Weeks in Treatment: 0 Foot Assessment Items Site Locations + = Sensation present, - = Sensation absent, C = Callus, U = Ulcer R = Redness, W = Warmth, M = Maceration, PU = Pre-ulcerative lesion F = Fissure, S = Swelling, D = Dryness Assessment Right: Left: Other Deformity: No No Prior Foot Ulcer: No No Prior Amputation: No No Charcot Joint: No No Ambulatory Status:  Non-ambulatory Assistance Device: Wheelchair Gait: Administrator, arts) Signed: 07/10/2018 4:12:52 PM By: Harold Barban Entered By: Harold Barban on 07/10/2018 14:02:11 Cutshaw, Garnetta P. ([id number]) -------------------------------------------------------------------------------- Nutrition Risk Screening Details Patient Name: Taplin, Fatin P. Date of Service: 07/10/2018 1:15 PM Medical Record Number: [id number] Patient Account Number: [id number] Date of Birth/Sex: Apr 15, 1940 (78 y.o. F) Treating RN: Harold Barban Primary Care Waylen Depaolo: Johny Drilling Other Clinician: Referring Shifa Brisbon: Johny Drilling Treating Nasario Czerniak/Extender: Melburn Hake, HOYT Weeks in Treatment: 0 Height (in): 67 Weight (lbs): 216 Body Mass Index (BMI): 33.8 Nutrition Risk Screening Items Score Screening NUTRITION RISK SCREEN: I have an illness or condition that made me change the kind and/or amount of 0 No food I eat I eat fewer than two meals per day 0 No I eat few fruits and vegetables, or milk products 0 No I have three or more drinks of beer, liquor or wine almost every day 0 No I have tooth or mouth problems that make it hard for me to eat 0 No I don't always have enough money to buy the food I need 0 No I eat alone most of the time 0 No I take three or more different prescribed or over-the-counter drugs a day 1 Yes Without wanting to, I have lost or gained 10 pounds in the last six months 0 No I am not always physically able to shop, cook and/or feed myself 0 No Nutrition Protocols Good Risk Protocol Moderate Risk Protocol High Risk Proctocol Risk Level: Good Risk Score: 1 Electronic Signature(s) Signed: 07/10/2018 4:12:52 PM By: Harold Barban Entered By: Harold Barban on 07/10/2018 13:45:03

## 2018-07-13 NOTE — Progress Notes (Signed)
CHANY, WOOLWORTH ([id number]) Visit Report for 07/10/2018 Allergy List Details Patient Name: Theresa [name], [name] Date of Service: 07/10/2018 1:15 PM Medical Record Number: [id number] Patient Account Number: [id number] Date of Birth/Sex: 1940/03/01 (79 y.o. F) Treating RN: Theresa [name] Primary Care Virginia Curl: Theresa [name] Other Clinician: Referring Theresa [name]: Theresa [name] Treating Theresa [name]/Extender: Theresa [name], Theresa [name] in Treatment: 0 Allergies Active Allergies codeine morphine HCl Allergy Notes Electronic Signature(s) Signed: 07/10/2018 4:12:52 PM By: Theresa [name] Entered By: Theresa [name] on 07/10/2018 13:39:23 Theresa [name], [name] ([id number]) -------------------------------------------------------------------------------- Arrival Information Details Patient Name: Theresa [name], [name] Date of Service: 07/10/2018 1:15 PM Medical Record Number: [id number] Patient Account Number: [id number] Date of Birth/Sex: [birth year] (78 y.o. F) Treating RN: Theresa [name] Primary Care Draven Natter: Theresa [name] Other Clinician: Referring Theresa [name]: Theresa [name] Treating Theresa [name]/Extender: Theresa [name], Theresa [name] in Treatment: 0 Visit Information Patient Arrived: Wheel Chair Arrival Time: 13:28 Accompanied By: daughter Transfer Assistance: EasyPivot Patient Lift Patient Identification Verified: Yes Secondary Verification Process Yes Completed: Patient Has Alerts: Yes Patient Alerts: DMII ABI 03/06/18 L 1.28 R 1.22 History Since Last Visit Added or deleted any medications: No Any new allergies or adverse reactions: No Had a fall or experienced change in activities of daily living that may affect risk of falls: No Signs or symptoms of abuse/neglect since last visito No Hospitalized since last visit: No Has Dressing in Place as Prescribed: No Electronic Signature(s) Signed: 07/10/2018 5:04:52 PM By: Theresa [name] Entered By: Theresa [name] on 07/10/2018 14:19:16 Theresa [name], [place].  ([id number]) -------------------------------------------------------------------------------- Clinic Level of Care Assessment Details Patient Name: [name], [name] Date of Service: 07/10/2018 1:15 PM Medical Record Number: [id number] Patient Account Number: [id number] Date of Birth/Sex: [birth year] (78 y.o. F) Treating RN: Theresa [name] Primary Care Alvine Mostafa: Theresa [name] Other Clinician: Referring Joshau Code: Theresa [name] Treating Almendra Loria/Extender: Theresa [name], Theresa [name] in Treatment: 0 Clinic Level of Care Assessment Items TOOL 1 Quantity Score []  - Use when EandM and Procedure is performed on INITIAL visit 0 ASSESSMENTS - Nursing Assessment / Reassessment X - General Physical Exam (combine w/ comprehensive assessment (listed just below) when 1 20 performed on new pt. evals) X- 1 25 Comprehensive Assessment (HX, ROS, Risk Assessments, Wounds Hx, etc.) ASSESSMENTS - Wound and Skin Assessment / Reassessment X - Dermatologic / Skin Assessment (not related to wound area) 1 10 ASSESSMENTS - Ostomy and/or Continence Assessment and Care []  - Incontinence Assessment and Management 0 []  - 0 Ostomy Care Assessment and Management (repouching, etc.) PROCESS - Coordination of Care X - Simple Patient / Family Education for ongoing care 1 15 []  - 0 Complex (extensive) Patient / Family Education for ongoing care X- 1 10 Staff obtains Programmer, systems, Records, Test Results / Process Orders []  - 0 Staff telephones HHA, Nursing Homes / Clarify orders / etc []  - 0 Routine Transfer to another Facility (non-emergent condition) []  - 0 Routine Hospital Admission (non-emergent condition) X- 1 15 New Admissions / Biomedical engineer / Ordering NPWT, Apligraf, etc. []  - 0 Emergency Hospital Admission (emergent condition) PROCESS - Special Needs []  - Pediatric / Minor Patient Management 0 []  - 0 Isolation Patient Management []  - 0 Hearing / Language / Visual special needs []  - 0 Assessment of  Community assistance (transportation, D/C planning, etc.) []  - 0 Additional assistance / Altered mentation []  - 0 Support Surface(s) Assessment (bed, cushion, seat, etc.) [name], [name] INTERVENTIONS - Miscellaneous []  - External ear exam 0 []  - 0 Patient Transfer (multiple staff / Civil Service fast streamer / Similar devices) []  -  0 Simple Staple / Suture removal (25 or less) []  - 0 Complex Staple / Suture removal (26 or more) []  - 0 Hypo/Hyperglycemic Management (do not check if billed separately) []  - 0 Ankle / Brachial Index (ABI) - do not check if billed separately Has the patient been seen at the hospital within the last three years: Yes Total Score: 95 Level Of Care: New/Established - Level 3 Electronic Signature(s) Signed: 07/10/2018 5:04:52 PM By: Theresa [name] Entered By: Theresa [name] on 07/10/2018 14:25:13 Theresa [name], [name] ([id number]) -------------------------------------------------------------------------------- Compression Therapy Details Patient Name: [name], [name] Date of Service: 07/10/2018 1:15 PM Medical Record Number: [id number] Patient Account Number: [id number] Date of Birth/Sex: [birth year] (78 y.o. F) Treating RN: Theresa [name] Primary Care Rhyanna Sorce: Theresa [name] Other Clinician: Referring Darlis Wragg: Theresa [name] Treating Jeiry Birnbaum/Extender: Theresa [name], Theresa [name] in Treatment: 0 Compression Therapy Performed for Wound Assessment: Wound #6 Left,Posterior Lower Leg Performed By: Clinician Theresa Hora, RN Compression Type: Three Layer Pre Treatment ABI: 1.3 Post Procedure Diagnosis Same as Pre-procedure Electronic Signature(s) Signed: 07/10/2018 5:04:52 PM By: Theresa [name] Entered By: Theresa [name] on 07/10/2018 14:27:34 Theresa [name], [name] ([id number]) -------------------------------------------------------------------------------- Encounter Discharge Information Details Patient Name: Theresa [name], [name] Date of Service: 07/10/2018 1:15  PM Medical Record Number: [id number] Patient Account Number: [id number] Date of Birth/Sex: [birth year] (78 y.o. F) Treating RN: Theresa [name] Primary Care Demetress Tift: Theresa [name] Other Clinician: Referring Analyse Angst: Theresa [name] Treating Takari Duncombe/Extender: Theresa [name], Theresa [name] in Treatment: 0 Encounter Discharge Information Items Discharge Condition: Stable Ambulatory Status: Wheelchair Discharge Destination: Home Transportation: Private Auto Accompanied By: daughter Schedule Follow-up Appointment: Yes Clinical Summary of Care: Electronic Signature(s) Signed: 07/10/2018 5:04:52 PM By: Theresa [name] Entered By: Theresa [name] on 07/10/2018 14:28:54 Strupp, Willamina P. ([id number]) -------------------------------------------------------------------------------- Lower Extremity Assessment Details Patient Name: Sigal, Harshita P. Date of Service: 07/10/2018 1:15 PM Medical Record Number: [id number] Patient Account Number: [id number] Date of Birth/Sex: 22-Jan-1941 (78 y.o. F) Treating RN: Theresa [name] Primary Care Janaiyah Blackard: Theresa [name] Other Clinician: Referring Ronte Parker: Theresa [name] Treating Brondon Wann/Extender: Theresa [name], Theresa [name] in Treatment: 0 Edema Assessment Assessed: [Left: No] [Right: No] Edema: [Left: Yes] [Right: Yes] Calf Left: Right: Point of Measurement: 37 cm From Medial Instep 41.6 cm 39.8 cm Ankle Left: Right: Point of Measurement: 12 cm From Medial Instep 24.2 cm 25 cm Vascular Assessment Pulses: Dorsalis Pedis Palpable: [Left:Yes] [Right:Yes] Posterior Tibial Palpable: [Left:Yes] [Right:Yes] Electronic Signature(s) Signed: 07/10/2018 4:12:52 PM By: Theresa [name] Entered By: Theresa [name] on 07/10/2018 13:57:27 Theresa [name], [name] ([id number]) -------------------------------------------------------------------------------- Multi Wound Chart Details Patient Name: Milstein, Myracle P. Date of Service: 07/10/2018 1:15 PM Medical Record Number:  [id number] Patient Account Number: [id number] Date of Birth/Sex: Dec 22, 1940 (78 y.o. F) Treating RN: Theresa [name] Primary Care Natesha Hassey: Theresa [name] Other Clinician: Referring Zacaria Pousson: Theresa [name] Treating Haruna Rohlfs/Extender: Theresa [name], Theresa [name] in Treatment: 0 Vital Signs Height(in): 71 Pulse(bpm): 42 Weight(lbs): 216 Blood Pressure(mmHg): 156/68 Body Mass Index(BMI): 34 Temperature(F): 98.1 Respiratory Rate 20 (breaths/min): Photos: [N/A:N/A] Wound Location: Left Lower Leg - Posterior N/A N/A Wounding Event: Gradually Appeared N/A N/A Primary Etiology: Diabetic Wound/Ulcer of the N/A N/A Lower Extremity Comorbid History: Cataracts, Angina, Coronary N/A N/A Artery Disease, Hypertension, Myocardial Infarction, Peripheral Venous Disease, Type II Diabetes, Osteoarthritis, Neuropathy, Received Chemotherapy, Received Radiation Date Acquired: 06/26/2018 N/A N/A [name] of Treatment: 0 N/A N/A Wound Status: Open N/A N/A Measurements L x W x D 2.5x1x0.1 N/A N/A (cm) Area (cm) : 1.963 N/A N/A Volume (cm) : 0.196 N/A N/A Classification: Grade 2 N/A N/A Exudate  Amount: Medium N/A N/A Exudate Type: Serous N/A N/A Exudate Color: amber N/A N/A Wound Margin: Flat and Intact N/A N/A Granulation Amount: Small (1-33%) N/A N/A Granulation Quality: Pale N/A N/A Necrotic Amount: Large (67-100%) N/A N/A Exposed Structures: Fat Layer (Subcutaneous N/A N/A Tissue) Exposed: Yes Theresa [name], [name] ([id number]) Fascia: No Tendon: No Muscle: No Joint: No Bone: No Epithelialization: None N/A N/A Treatment Notes Electronic Signature(s) Signed: 07/10/2018 5:04:52 PM By: Theresa [name] Entered By: Theresa [name] on 07/10/2018 14:21:02 Theresa [name], Theresa [name] ([id number]) -------------------------------------------------------------------------------- Multi-Disciplinary Care Plan Details Patient Name: Orea, Radiah P. Date of Service: 07/10/2018 1:15 PM Medical Record Number:  [id number] Patient Account Number: [id number] Date of Birth/Sex: [birth year] (78 y.o. F) Treating RN: Theresa [name] Primary Care Lanyia Jewel: Theresa [name] Other Clinician: Referring Gladstone Rosas: Theresa [name] Treating Miki Blank/Extender: Theresa [name], Theresa [name] in Treatment: 0 Active Inactive Abuse / Safety / Falls / Self Care Management Nursing Diagnoses: Impaired physical mobility Goals: Patient will not develop complications from immobility Date Initiated: 07/10/2018 Target Resolution Date: 10/03/2018 Goal Status: Active Interventions: Assess fall risk on admission and as needed Notes: Venous Leg Ulcer Nursing Diagnoses: Actual venous Insuffiency (use after diagnosis is confirmed) Goals: Patient will maintain optimal edema control Date Initiated: 07/10/2018 Target Resolution Date: 10/03/2018 Goal Status: Active Interventions: Compression as ordered Provide education on venous insufficiency Notes: Wound/Skin Impairment Nursing Diagnoses: Impaired tissue integrity Goals: Ulcer/skin breakdown will heal within 14 [name] Date Initiated: 07/10/2018 Target Resolution Date: 10/03/2018 Goal Status: Active Interventions: VETA, DAMBROSIA ([id number]) Assess patient/caregiver ability to obtain necessary supplies Assess patient/caregiver ability to perform ulcer/skin care regimen upon admission and as needed Assess ulceration(s) every visit Notes: Electronic Signature(s) Signed: 07/10/2018 5:04:52 PM By: Theresa [name] Entered By: Theresa [name] on 07/10/2018 14:20:49 Theresa [name], [name] ([id number]) -------------------------------------------------------------------------------- Non-Wound Condition Assessment Details Patient Name: Theresa [name], [name] Date of Service: 07/10/2018 1:15 PM Medical Record Number: [id number] Patient Account Number: [id number] Date of Birth/Sex: 1940/07/18 (78 y.o. F) Treating RN: Theresa [name] Primary Care Rosalba Totty: Theresa [name] Other Clinician: Referring  Taj Nevins: Theresa [name] Treating Breelynn Bankert/Extender: Theresa [name], Theresa [name] in Treatment: 0 Non-Wound Condition: Condition: Rash / Dermatitis Location: Abdomen Side: Bilateral Photos Electronic Signature(s) Signed: 07/10/2018 4:12:52 PM By: Theresa [name] Entered By: Theresa [name] on 07/10/2018 13:58:29 Theresa [name], Carley P. ([id number]) -------------------------------------------------------------------------------- Pain Assessment Details Patient Name: Deeley, Aava P. Date of Service: 07/10/2018 1:15 PM Medical Record Number: [id number] Patient Account Number: [id number] Date of Birth/Sex: 11-06-1940 (78 y.o. F) Treating RN: Theresa [name] Primary Care Sydnee Lamour: Theresa [name] Other Clinician: Referring Chrisann Melaragno: Theresa [name] Treating Trinna Kunst/Extender: Theresa [name], Theresa [name] in Treatment: 0 Active Problems Location of Pain Severity and Description of Pain Patient Has Paino No Site Locations Pain Management and Medication Current Pain Management: Electronic Signature(s) Signed: 07/10/2018 4:12:52 PM By: Theresa [name] Entered By: Theresa [name] on 07/10/2018 13:37:47 Ken, Marlin P. ([id number]) -------------------------------------------------------------------------------- Patient/Caregiver Education Details Patient Name: Blackard, Simrah P. Date of Service: 07/10/2018 1:15 PM Medical Record Number: [id number] Patient Account Number: [id number] Date of Birth/Gender: April 08, 1940 (78 y.o. F) Treating RN: Theresa [name] Primary Care Physician: Theresa [name] Other Clinician: Referring Physician: Johny [name] Treating Physician/Extender: Theresa [name], Theresa [name] in Treatment: 0 Education Assessment Education Provided To: Patient and Caregiver Education Topics Provided Venous: Handouts: Other: please use pumps and elevate your legs Methods: Explain/Verbal Responses: State content correctly Electronic Signature(s) Signed: 07/10/2018 5:04:52 PM By: Theresa [name] Entered By:  Theresa [name] on 07/10/2018 14:25:39 Gallus, Mana P. ([id number]) -------------------------------------------------------------------------------- Wound Assessment Details Patient Name: Idrovo, Devin P. Date of  Service: 07/10/2018 1:15 PM Medical Record Number: [id number] Patient Account Number: [id number] Date of Birth/Sex: [birth year] (77 y.o. F) Treating RN: Theresa [name] Primary Care Zaley Talley: Theresa [name] Other Clinician: Referring Jazsmine Macari: Theresa [name] Treating Cheyne Bungert/Extender: Theresa [name], Theresa [name] in Treatment: 0 Wound Status Wound Number: 6 Primary Diabetic Wound/Ulcer of the Lower Extremity Etiology: Wound Location: Left Lower Leg - Posterior Wound Open Wounding Event: Gradually Appeared Status: Date Acquired: 06/26/2018 Comorbid Cataracts, Angina, Coronary Artery Disease, [name] Of Treatment: 0 History: Hypertension, Myocardial Infarction, Peripheral Clustered Wound: No Venous Disease, Type II Diabetes, Osteoarthritis, Neuropathy, Received Chemotherapy, Received Radiation Photos Wound Measurements Length: (cm) 2.5 % Reduction in Width: (cm) 1 % Reduction in Depth: (cm) 0.1 Epithelializat Area: (cm) 1.963 Tunneling: Volume: (cm) 0.196 Undermining: Area: Volume: ion: None No No Wound Description Classification: Grade 2 Foul Odor Afte Wound Margin: Flat and Intact Slough/Fibrino Exudate Amount: Medium Exudate Type: Serous Exudate Color: amber r Cleansing: No Yes Wound Bed Granulation Amount: Small (1-33%) Exposed Structure Granulation Quality: Pale Fascia Exposed: No Necrotic Amount: Large (67-100%) Fat Layer (Subcutaneous Tissue) Exposed: Yes Necrotic Quality: Adherent Slough Tendon Exposed: No Muscle Exposed: No Joint Exposed: No Bone Exposed: No Aguiniga, Lenay P. ([id number]) Treatment Notes Wound #6 (Left, Posterior Lower Leg) Notes silvercel, abd and bilateral 3 layer with unna to anchor Electronic Signature(s) Signed:  07/10/2018 4:12:52 PM By: Theresa [name] Entered By: Theresa [name] on 07/10/2018 13:54:37 Powe, Marytza P. ([id number]) -------------------------------------------------------------------------------- Vitals Details Patient Name: Barkey, Deneka P. Date of Service: 07/10/2018 1:15 PM Medical Record Number: [id number] Patient Account Number: [id number] Date of Birth/Sex: [birth year] (78 y.o. F) Treating RN: Theresa [name] Primary Care Marshae Azam: Theresa [name] Other Clinician: Referring Dolly Harbach: Theresa [name] Treating Jax Abdelrahman/Extender: Theresa [name], Theresa [name] in Treatment: 0 Vital Signs Time Taken: 13:30 Temperature (F): 98.1 Height (in): 67 Pulse (bpm): 97 Source: Stated Respiratory Rate (breaths/min): 20 Weight (lbs): 216 Blood Pressure (mmHg): 156/68 Source: Stated Reference Range: 80 - 120 mg / dl Body Mass Index (BMI): 33.8 Electronic Signature(s) Signed: 07/10/2018 4:12:52 PM By: Theresa [name] Entered By: Theresa [name] on 07/10/2018 13:39:03

## 2018-07-13 NOTE — Progress Notes (Signed)
AISHAH, TEFFETELLER ([id number]) Visit Report for 07/10/2018 Chief Complaint Document Details Patient Name: Theresa [name], Theresa [name]. Date of Service: 07/10/2018 1:15 PM Medical Record Number: [id number] Patient Account Number: [id number] Date of Birth/Sex: 1940/11/25 (78 y.o. F) Treating RN: Montey Hora Primary Care Provider: Johny Drilling Other Clinician: Referring Provider: Johny Drilling Treating Provider/Extender: Melburn Hake, Diogenes Whirley Weeks in Treatment: 0 Information Obtained from: Patient Chief Complaint Left LE ulcers Electronic Signature(s) Signed: 07/13/2018 12:02:24 PM By: Worthy Keeler PA-C Entered By: Worthy Keeler on 07/10/2018 14:07:58 Jovel, Laddie P. ([id number]) -------------------------------------------------------------------------------- HPI Details Patient Name: [name], [name] Date of Service: 07/10/2018 1:15 PM Medical Record Number: [id number] Patient Account Number: [id number] Date of Birth/Sex: 1940/04/02 (78 y.o. F) Treating RN: Montey Hora Primary Care Provider: Johny Drilling Other Clinician: Referring Provider: Johny Drilling Treating Provider/Extender: Melburn Hake, Jessy Cybulski Weeks in Treatment: 0 History of Present Illness HPI Description: 03/09/18 on evaluation today patient presents for initial inspection in our clinic concerning issues that she has been having with bilateral lower extremity ulcers. She has lymphedema as well as chronic venous stasis. She has seen Dr. dew where she's undergone ABI testing with a left ABI 1.28 a right ABI 1.22. Subsequently she is supposed to be undergoing venous studies as well which are upcoming. She does have a history of diabetes, chronic venous stasis, peripheral vascular disease, and hypertension. Currently these wounds which are currently draining are stated to have been open for roughly 2 weeks or so. It does not appear to be any evidence of infection. No fevers, chills, nausea, or vomiting noted at this time. With that  being said the patient does have some prominence to the ankle medially especially on the left side and I'm not sure if something was rubbing which calls this issue either. 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 She did have [name] [place] placed at the request of her daughter when she was at vascular this seems to may be potentially helped a little bit. 03/16/18 on evaluation today patient's right lower extremity appears to likely be completely healed although there still small area of concern which I'm gonna continue to watch. The really does not appear to be any weeping at this point which is good news. Overall I feel like she is done excellent. In regard to left lower extremity this is a little larger still than the right although again the swelling is indeed down. The wound over the media malleolus does appear to be doing better which is good news. No fevers, chills, nausea, or vomiting noted at this time. Unfortunately the patient still continues to have right hip pain which is getting more severe. Again her surgeon did not want to undergo surgery until everything was healed as far as the wounds of the right lower extremity. 03/23/18 on evaluation today patient actually appears to be doing excellent in regard to her right lower extremity. There are no open wounds at this point in time and there is no weeping of drainage the compression wraps appear to have done very well for her. She did get the FarrowWrap 4000 Velcro compression wraps which we are gonna initiate treatment with today and this should help continue to keep swelling under control as well is the weeping. Again everything appears to be doing great in regard to the right lower  extremity. In regard to left lower extremity everything is pretty much close there are no significant open  wounds the only actual open wound that she has is on the medial left ankle which again is the opposite side from where her hip issue is and this is very superficial and shows no signs of infection at this point. 04/02/18 on evaluation today patient actually presents for [name] early appointment after I saw her last week due to several areas the left lower extremity which had reopened causing some issues at this point. Fortunately there does not appear to be any evidence of infection at this time. With that being said she again is waiting on everything to be better in order to be able to proceed with the surgery for her right hip as previously suggested. 04/09/18 on evaluation today patient actually appears to be doing better in regard to her left lower surety ulcers which is great news. Unfortunately she continues to have issues even on the right side there are no open wounds that she does have a blister that arose on the right at this point as well. Fortunately there's no evidence of infection currently. No fevers, chills, nausea, or vomiting noted at this time. 04/16/18 on evaluation today patient actually appears to be doing very well in regard to her left lower extremity all the wounds appear to be completely healed including the left medial malleolus which was of greatest concern. The only if and when she has some regard to the right lower extremity where there is a very superficial area of blistering which is open at this point. Fortunately there's no signs of active infection otherwise. Overall I feel like she would be very safe to proceed with that surgery at this time. 04/21/18 on evaluation today patient presents for early follow-up she was supposed to be here in two days but nonetheless she is here sooner due to the fact that she's been having issues with her right wrap which is actually causing her a lot of discomfort. She states her leg has been more swollen and painful something she really has  not experienced in recent weeks. Adria, Costley Stuti P. ([id number]) Again this leg has been doing very well there's just a small blister that we were trading on the lateral aspect of her right leg. Again the left leg has pretty much completely resolved. Fortunately upon inspection today there does not appear to be any evidence of active infection systemically. With that being said I'm questioning whether or not she may have some cellulitis around the right lower extremity especially with the wrap somewhat pushed down and I think she may be scratching at this area. Readmission: 07/10/18 patient presents today for reevaluation our clinic concerning issues that she has been having with her bilateral lower extremities with regard to open wounds/weeping. Right now she's having more than issue on the left posterior lower extremity although there is some weeping from the right posterior lower extremity as well. Fortunately there's no signs of active infection at this time which is good news. She really has not been using lymphedema pumps on a regular basis I do believe this would be of benefit for her. It also sounds like she has not been using the compression wraps that were Velcro that we got for her last time I think this likewise would also likely done better than just doing the ace wrap or whatnot when occasionally her legs were wrapped. Either way I think right now that if we initiate compression  wrapping we can likely get things back under control for her. She needs to have this under control so she can proceed with surgery for her right hip. Electronic Signature(s) Signed: 07/13/2018 12:02:24 PM By: Worthy Keeler PA-C Entered By: Worthy Keeler on 07/10/2018 14:38:26 Chasen, Fredna Dow ([id number]) -------------------------------------------------------------------------------- Physical Exam Details Patient Name: Canan, Riot P. Date of Service: 07/10/2018 1:15 PM Medical Record Number:  [id number] Patient Account Number: [id number] Date of Birth/Sex: 11-20-1940 (78 y.o. F) Treating RN: Montey Hora Primary Care Provider: Johny Drilling Other Clinician: Referring Provider: Johny Drilling Treating Provider/Extender: Melburn Hake, Aalijah Mims Weeks in Treatment: 0 Constitutional patient is hypertensive.. pulse regular and within target range for patient.Marland Kitchen respirations regular, non-labored and within target range for patient.Marland Kitchen temperature within target range for patient.. Well-nourished and well-hydrated in no acute distress. Eyes conjunctiva clear no eyelid edema noted. pupils equal round and reactive to light and accommodation. Ears, Nose, Mouth, and Throat no gross abnormality of ear auricles or external auditory canals. normal hearing noted during conversation. mucus membranes moist. Respiratory normal breathing without difficulty. clear to auscultation bilaterally. Cardiovascular regular rate and rhythm with normal S1, S2. 1+ dorsalis pedis/posterior tibialis pulses. 2+ pitting edema of the bilateral lower extremities. Gastrointestinal (GI) soft, non-tender, non-distended, +BS. no hepatosplenomegaly. no ventral hernia noted. Musculoskeletal Patient unable to walk without assistance. Chronic left hip arthritis and pain. Psychiatric this patient is able to make decisions and demonstrates good insight into disease process. Alert and Oriented x 3. pleasant and cooperative. Notes Upon inspection today patient's wounds really do not appear to be more than just superficial breakdown of the skin secondary to maceration and weeping from the lymphedema. Nonetheless I do believe that she needs to have intervention in order to help with getting this under control right now as again she does appear to be much more swollen then the last time I saw her which was actually discharged when she had healed. Electronic Signature(s) Signed: 07/13/2018 12:02:24 PM By: Worthy Keeler PA-C Entered  By: Worthy Keeler on 07/10/2018 14:39:30 Lafosse, Fredna Dow ([id number]) -------------------------------------------------------------------------------- Physician Orders Details Patient Name: Cardamone, Madellyn P. Date of Service: 07/10/2018 1:15 PM Medical Record Number: [id number] Patient Account Number: [id number] Date of Birth/Sex: 1940-03-04 (78 y.o. F) Treating RN: Montey Hora Primary Care Provider: Johny Drilling Other Clinician: Referring Provider: Johny Drilling Treating Provider/Extender: Melburn Hake, Karyssa Amaral Weeks in Treatment: 0 Verbal / Phone Orders: No Diagnosis Coding ICD-10 Coding Code Description E11.622 Type 2 diabetes mellitus with other skin ulcer I87.2 Venous insufficiency (chronic) (peripheral) L97.822 Non-pressure chronic ulcer of other part of left lower leg with fat layer exposed I10 Essential (primary) hypertension Wound Cleansing Wound #6 Left,Posterior Lower Leg o Clean wound with Normal Saline. o Cleanse wound with mild soap and water - DO NOT GET YOUR WRAPS WET Primary Wound Dressing Wound #6 Left,Posterior Lower Leg o Silver Alginate Secondary Dressing Wound #6 Left,Posterior Lower Leg o ABD pad Dressing Change Frequency Wound #6 Left,Posterior Lower Leg o Other: - twice weekly Follow-up Appointments Wound #6 Left,Posterior Lower Leg o Return Appointment in 1 week. o Nurse Visit as needed - Monday or Tuesday Edema Control Wound #6 Left,Posterior Lower Leg o 3 Layer Compression System - Bilateral Patient Medications Allergies: codeine, morphine HCl Notifications Medication Indication Start End nystatin DOSE topical 100,000 unit/gram powder - powder topical applied to the affected regions of you body daily until rash resolves PIA, JEDLICKA. ([id number]) Electronic Signature(s) Signed: 07/10/2018 3:57:25 PM By: Worthy Keeler PA-C Entered  By: Worthy Keeler on 07/10/2018 15:57:24 Clink, Verdelle Mamie Nick  ([id number]) -------------------------------------------------------------------------------- Problem List Details Patient Name: Kantner, Josilyn P. Date of Service: 07/10/2018 1:15 PM Medical Record Number: [id number] Patient Account Number: [id number] Date of Birth/Sex: April 23, 1940 (78 y.o. F) Treating RN: Montey Hora Primary Care Provider: Johny Drilling Other Clinician: Referring Provider: Johny Drilling Treating Provider/Extender: Melburn Hake, Breionna Punt Weeks in Treatment: 0 Active Problems ICD-10 Evaluated Encounter Code Description Active Date Today Diagnosis E11.622 Type 2 diabetes mellitus with other skin ulcer 07/10/2018 No Yes I87.2 Venous insufficiency (chronic) (peripheral) 07/10/2018 No Yes L97.822 Non-pressure chronic ulcer of other part of left lower leg with 07/10/2018 No Yes fat layer exposed [place] (primary) hypertension 07/10/2018 No Yes Inactive Problems Resolved Problems Electronic Signature(s) Signed: 07/13/2018 12:02:24 PM By: Worthy Keeler PA-C Entered By: Worthy Keeler on 07/10/2018 14:07:41 Ben, Coy P. ([id number]) -------------------------------------------------------------------------------- Progress Note Details Patient Name: Brennen, Nattie P. Date of Service: 07/10/2018 1:15 PM Medical Record Number: [id number] Patient Account Number: [id number] Date of Birth/Sex: [birth year] (78 y.o. F) Treating RN: Montey Hora Primary Care Provider: Johny Drilling Other Clinician: Referring Provider: Johny Drilling Treating Provider/Extender: Melburn Hake, Nain Rudd Weeks in Treatment: 0 Subjective Chief Complaint Information obtained from Patient Left LE ulcers History of Present Illness (HPI) 03/09/18 on evaluation today patient presents for initial inspection in our clinic concerning issues that she has been having with bilateral lower extremity ulcers. She has lymphedema as well as chronic venous stasis. She has seen Dr. dew where she's undergone ABI testing  with a left ABI 1.28 a right ABI 1.22. Subsequently she is supposed to be undergoing venous studies as well which are upcoming. She does have a history of diabetes, chronic venous stasis, peripheral vascular disease, and hypertension. Currently these wounds which are currently draining are stated to have been open for roughly 2 weeks or so. It does not appear to be any evidence of infection. No fevers, chills, nausea, or vomiting noted at this time. With that being said the patient does have some prominence to the ankle medially especially on the left side and I'm not sure if something was rubbing which calls this issue either. 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 She did have [name] [place] placed at the request of her daughter when she was at vascular this seems to may be potentially helped a little bit. 03/16/18 on evaluation today patient's right lower extremity appears to likely be completely healed although there still small area of concern which I'm gonna continue to watch. The really does not appear to be any weeping at this point which is good news. Overall I feel like she is done excellent. In regard to left lower extremity this is a little larger still than the right although again the swelling is indeed down. The wound over the media malleolus does appear to be doing better which is good news. No fevers, chills, nausea, or vomiting noted at this time. Unfortunately the patient still continues to have right hip pain which is getting more severe. Again her surgeon did not want to undergo surgery until everything was healed as far as the wounds of the right lower extremity. 03/23/18 on evaluation today patient actually appears to be doing excellent in regard to her right lower extremity. There are no open wounds at this point  in time and  there is no weeping of drainage the compression wraps appear to have done very well for her. She did get the FarrowWrap 4000 Velcro compression wraps which we are gonna initiate treatment with today and this should help continue to keep swelling under control as well is the weeping. Again everything appears to be doing great in regard to the right lower extremity. In regard to left lower extremity everything is pretty much close there are no significant open wounds the only actual open wound that she has is on the medial left ankle which again is the opposite side from where her hip issue is and this is very superficial and shows no signs of infection at this point. 04/02/18 on evaluation today patient actually presents for [name] early appointment after I saw her last week due to several areas the left lower extremity which had reopened causing some issues at this point. Fortunately there does not appear to be any evidence of infection at this time. With that being said she again is waiting on everything to be better in order to be able to proceed with the surgery for her right hip as previously suggested. 04/09/18 on evaluation today patient actually appears to be doing better in regard to her left lower surety ulcers which is great news. Unfortunately she continues to have issues even on the right side there are no open wounds that she does have a blister that arose on the right at this point as well. Fortunately there's no evidence of infection currently. No fevers, chills, nausea, or vomiting noted at this time. 04/16/18 on evaluation today patient actually appears to be doing very well in regard to her left lower extremity all the wounds appear to be completely healed including the left medial malleolus which was of greatest concern. The only if and when she has some regard to the right lower extremity where there is a very superficial area of blistering which is open at this point. Fortunately  there's no signs of active infection otherwise. Overall I feel like she would be very safe to proceed with that Nachreiner, Brittanie P. ([id number]) surgery at this time. 04/21/18 on evaluation today patient presents for early follow-up she was supposed to be here in two days but nonetheless she is here sooner due to the fact that she's been having issues with her right wrap which is actually causing her a lot of discomfort. She states her leg has been more swollen and painful something she really has not experienced in recent weeks. Again this leg has been doing very well there's just a small blister that we were trading on the lateral aspect of her right leg. Again the left leg has pretty much completely resolved. Fortunately upon inspection today there does not appear to be any evidence of active infection systemically. With that being said I'm questioning whether or not she may have some cellulitis around the right lower extremity especially with the wrap somewhat pushed down and I think she may be scratching at this area. Readmission: 07/10/18 patient presents today for reevaluation our clinic concerning issues that she has been having with her bilateral lower extremities with regard to open wounds/weeping. Right now she's having more than issue on the left posterior lower extremity although there is some weeping from the right posterior lower extremity as well. Fortunately there's no signs of active infection at this time which is good news. She really has not been using lymphedema pumps on a regular basis I  do believe this would be of benefit for her. It also sounds like she has not been using the compression wraps that were Velcro that we got for her last time I think this likewise would also likely done better than just doing the ace wrap or whatnot when occasionally her legs were wrapped. Either way I think right now that if we initiate compression wrapping we can likely get things back under  control for her. She needs to have this under control so she can proceed with surgery for her right hip. Patient History Information obtained from Patient. Allergies codeine, morphine HCl Social History Never smoker, Marital Status - Married, Alcohol Use - Never, Drug Use - No History, Caffeine Use - Never. Medical History Eyes Patient has history of Cataracts - removed Denies history of Glaucoma, Optic Neuritis Ear/Nose/Mouth/Throat Denies history of Chronic sinus problems/congestion, Middle ear problems Hematologic/Lymphatic Denies history of Anemia, Hemophilia, Human Immunodeficiency Virus, Lymphedema, Sickle Cell Disease Respiratory Denies history of Aspiration, Asthma, Chronic Obstructive Pulmonary Disease (COPD), Pneumothorax, Sleep Apnea, Tuberculosis Cardiovascular Patient has history of Angina, Coronary Artery Disease, Hypertension, Myocardial Infarction - more than 5 years ago, Peripheral Venous Disease Denies history of Arrhythmia, Congestive Heart Failure, Deep Vein Thrombosis, Hypotension, Peripheral Arterial Disease, Phlebitis, Vasculitis Gastrointestinal Denies history of Cirrhosis , Colitis, Crohn s, Hepatitis A, Hepatitis B, Hepatitis C Endocrine Patient has history of Type II Diabetes Denies history of Type I Diabetes Genitourinary Denies history of End Stage Renal Disease Immunological Denies history of Lupus Erythematosus, Raynaud s, Scleroderma Integumentary (Skin) Denies history of History of Burn, History of pressure wounds Vaccarella, Suetta P. ([id number]) Musculoskeletal Patient has history of Osteoarthritis Denies history of Gout, Rheumatoid Arthritis, Osteomyelitis Neurologic Patient has history of Neuropathy Denies history of Dementia, Quadriplegia, Paraplegia, Seizure Disorder Oncologic Patient has history of Received Chemotherapy, Received Radiation Psychiatric Denies history of Anorexia/bulimia, Confinement Anxiety Patient is treated with  Insulin. Blood sugar is tested. Blood sugar results noted at the following times: Breakfast - 211, Lunch - 240. Medical And Surgical History Notes Genitourinary CKD stage 3 Oncologic 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 Objective Constitutional patient is hypertensive.. pulse regular and within target range for patient.Marland Kitchen respirations regular, non-labored and within target range for patient.Marland Kitchen temperature within target range for patient.. Well-nourished and well-hydrated in no acute distress. Vitals Time Taken: 1:30 PM, Height: 67 in, Source: Stated, Weight: 216 lbs, Source: Stated, BMI: 33.8, Temperature: 98.1 F, Pulse: 97 bpm, Respiratory Rate: 20 breaths/min, Blood Pressure: 156/68 mmHg. Eyes conjunctiva clear no eyelid edema noted. pupils equal round and reactive to light and accommodation. Ears, Nose, Mouth, and Throat no gross abnormality of ear auricles or external auditory canals. normal hearing noted during conversation. mucus membranes moist. Respiratory normal breathing without difficulty. clear to auscultation bilaterally. Cardiovascular regular rate and rhythm with normal S1, S2. 1+ dorsalis pedis/posterior tibialis pulses. 2+ pitting edema of the bilateral lower extremities. Gastrointestinal (GI) soft, non-tender, non-distended, +BS. no hepatosplenomegaly. no ventral hernia noted. Musculoskeletal Patient unable to walk without assistance. Chronic left hip arthritis and pain. RONIYAH, LLORENS ([id number]) Psychiatric this patient is able to make decisions and demonstrates good insight into disease process. Alert and Oriented x 3. pleasant and cooperative. General Notes: Upon inspection today patient's wounds really do not appear to be more than just superficial breakdown of the skin secondary to maceration and weeping from the lymphedema. Nonetheless I do believe that she needs to have intervention in order to help with getting this under  control  right now as again she does appear to be much more swollen then the last time I saw her which was actually discharged when she had healed. Integumentary (Hair, Skin) Wound #6 status is Open. Original cause of wound was Gradually Appeared. The wound is located on the Left,Posterior Lower Leg. The wound measures 2.5cm length x 1cm width x 0.1cm depth; 1.963cm^2 area and 0.196cm^3 volume. There is Fat Layer (Subcutaneous Tissue) Exposed exposed. There is no tunneling or undermining noted. There is a medium amount of serous drainage noted. The wound margin is flat and intact. There is small (1-33%) pale granulation within the wound bed. There is a large (67-100%) amount of necrotic tissue within the wound bed including Adherent Slough. Other Condition(s) Patient presents with Rash / Dermatitis located on the Bilateral Abdomen. Assessment Active Problems ICD-10 Type 2 diabetes mellitus with other skin ulcer Venous insufficiency (chronic) (peripheral) Non-pressure chronic ulcer of other part of left lower leg with fat layer exposed Essential (primary) hypertension Procedures Wound #6 Pre-procedure diagnosis of Wound #6 is a Diabetic Wound/Ulcer of the Lower Extremity located on the Left,Posterior Lower Leg . There was a Three Layer Compression Therapy Procedure with a pre-treatment ABI of 1.3 by Montey Hora, RN. Post procedure Diagnosis Wound #6: Same as Pre-Procedure Plan Wound Cleansing: Wound #6 Left,Posterior Lower Leg: Clean wound with Normal Saline. Cleanse wound with mild soap and water - DO NOT GET YOUR WRAPS WET Primary Wound Dressing: Wound #6 Left,Posterior Lower Leg: Sindelar, Yaeko P. ([id number]) Silver Alginate Secondary Dressing: Wound #6 Left,Posterior Lower Leg: ABD pad Dressing Change Frequency: Wound #6 Left,Posterior Lower Leg: Other: - twice weekly Follow-up Appointments: Wound #6 Left,Posterior Lower Leg: Return Appointment in 1 week. Nurse Visit as needed -  Monday or Tuesday Edema Control: Wound #6 Left,Posterior Lower Leg: 3 Layer Compression System - Bilateral The following medication(s) was prescribed: nystatin topical 100,000 unit/gram powder powder topical applied to the affected regions of you body daily until rash resolves At this point I do not see any signs of infection I think actually with appropriate compression wrap and we will be able to get this under control without any significant problems or complications. At this time my suggestion for the patient was that we go ahead and initiate a three layer compression wrap bilaterally utilizing the silver alginate dressing which was previously beneficial for her. She's in agreement with that plan. We will subsequently see were things stand at follow-up. She also tells me that she'll work on trying to use her lymphedema pumps two times a day which I think will likewise also be helpful in this regard." Subsequently the goal is to get things under control so that she will be able to proceed with the hip surgery. Hopefully sooner rather than later. I will see her back next week. Please see above for specific wound care orders. We will see patient for re-evaluation in 1 week(s) here in the clinic. If anything worsens or changes patient will contact our office for additional recommendations. Electronic Signature(s) Signed: 07/13/2018 12:02:24 PM By: Worthy Keeler PA-C Entered By: Worthy Keeler on 07/10/2018 15:57:44 Antonopoulos, Vrinda PMarland Kitchen ([id number]) -------------------------------------------------------------------------------- ROS/PFSH Details Patient Name: Abeyta, Verla P. Date of Service: 07/10/2018 1:15 PM Medical Record Number: [id number] Patient Account Number: [id number] Date of Birth/Sex: 1940/06/26 (79 y.o. F) Treating RN: Harold Barban Primary Care Provider: Johny Drilling Other Clinician: Referring Provider: Johny Drilling Treating Provider/Extender: Melburn Hake, Orvell Careaga Weeks in  Treatment: 0 Information Obtained From Patient Eyes  Medical History: Positive for: Cataracts - removed Negative for: Glaucoma; Optic Neuritis Ear/Nose/Mouth/Throat Medical History: Negative for: Chronic sinus problems/congestion; Middle ear problems Hematologic/Lymphatic Medical History: Negative for: Anemia; Hemophilia; Human Immunodeficiency Virus; Lymphedema; Sickle Cell Disease Respiratory Medical History: Negative for: Aspiration; Asthma; Chronic Obstructive Pulmonary Disease (COPD); Pneumothorax; Sleep Apnea; Tuberculosis Cardiovascular Medical History: Positive for: Angina; Coronary Artery Disease; Hypertension; Myocardial Infarction - more than 5 years ago; Peripheral Venous Disease Negative for: Arrhythmia; Congestive Heart Failure; Deep Vein Thrombosis; Hypotension; Peripheral Arterial Disease; Phlebitis; Vasculitis Gastrointestinal Medical History: Negative for: Cirrhosis ; Colitis; Crohnos; Hepatitis A; Hepatitis B; Hepatitis C Endocrine Medical History: Positive for: Type II Diabetes Negative for: Type I Diabetes Treated with: Insulin Blood sugar tested every day: Yes Tested : Blood sugar testing results: Breakfast: 211; Lunch: 240 Genitourinary Maqueda, Naomia P. ([id number]) Medical History: Negative for: End Stage Renal Disease Past Medical History Notes: CKD stage 3 Immunological Medical History: Negative for: Lupus Erythematosus; Raynaudos; Scleroderma Integumentary (Skin) Medical History: Negative for: History of Burn; History of pressure wounds Musculoskeletal Medical History: Positive for: Osteoarthritis Negative for: Gout; Rheumatoid Arthritis; Osteomyelitis Neurologic Medical History: Positive for: Neuropathy Negative for: Dementia; Quadriplegia; Paraplegia; Seizure Disorder Oncologic Medical History: Positive for: Received Chemotherapy; Received Radiation Past Medical History Notes: 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 Psychiatric Medical History: Negative for: Anorexia/bulimia; Confinement Anxiety HBO Extended History Items Eyes: Cataracts Immunizations Pneumococcal Vaccine: Received Pneumococcal Vaccination: Yes Implantable Devices No devices added Family and Social History Never smoker; Marital Status - Married; Alcohol Use: Never; Drug Use: No History; Caffeine Use: Never; Financial Concerns: No; Food, Clothing or Shelter Needs: No; Support System Lacking: No; Transportation Concerns: No Electronic Signature(s) Signed: 07/10/2018 4:12:52 PM By: Harold Barban Signed: 07/13/2018 12:02:24 PM By: Herbert Pun, [place]. ([id number]) Entered By: Harold Barban on 07/10/2018 13:42:21 Guttman, Basha P. ([id number]) -------------------------------------------------------------------------------- SuperBill Details Patient Name: Goulette, Tirza P. Date of Service: 07/10/2018 Medical Record Number: [id number] Patient Account Number: [id number] Date of Birth/Sex: January 26, 1941 (78 y.o. F) Treating RN: Montey Hora Primary Care Provider: Johny Drilling Other Clinician: Referring Provider: Johny Drilling Treating Provider/Extender: Melburn Hake, Gwenlyn Hottinger Weeks in Treatment: 0 Diagnosis Coding ICD-10 Codes Code Description E11.622 Type 2 diabetes mellitus with other skin ulcer I87.2 Venous insufficiency (chronic) (peripheral) L97.822 Non-pressure chronic ulcer of other part of left lower leg with fat layer exposed I10 Essential (primary) hypertension Facility Procedures CPT4 Code: [id number] Description: 99213 - WOUND CARE VISIT-LEV 3 EST PT Modifier: Quantity: 1 Physician Procedures CPT4 Code Description: 8786767 99214 - WC PHYS LEVEL 4 - EST PT ICD-10 Diagnosis Description E11.622 Type 2 diabetes mellitus with other skin ulcer I87.2 Venous insufficiency (chronic) (peripheral) L97.822 Non-pressure chronic ulcer of other part of left  lower leg wit I10 Essential (primary)  hypertension Modifier: h fat layer expos Quantity: 1 ed Electronic Signature(s) Signed: 07/13/2018 12:02:24 PM By: Worthy Keeler PA-C Entered By: Worthy Keeler on 07/10/2018 14:40:40

## 2018-07-14 ENCOUNTER — Other Ambulatory Visit: Payer: Self-pay

## 2018-07-14 DIAGNOSIS — E11621 Type 2 diabetes mellitus with foot ulcer: Secondary | ICD-10-CM | POA: Diagnosis not present

## 2018-07-14 NOTE — Progress Notes (Addendum)
KHIARA, SHUPING ([id number]) Visit Report for 07/14/2018 Arrival Information Details Patient Name: Theresa, [name]. Date of Service: 07/14/2018 10:30 AM Medical Record Number: [id number] Patient Account Number: [id number] Date of Birth/Sex: 02/22/1940 (77 y.o. F) Treating RN: Army Melia Primary Care Quintessa Simmerman: Johny Drilling Other Clinician: Referring Lakeisa Heninger: Johny Drilling Treating Kaius Daino/Extender: Melburn Hake, HOYT Weeks in Treatment: 0 Visit Information History Since Last Visit Added or deleted any medications: No Patient Arrived: Wheel Chair Any new allergies or adverse reactions: No Arrival Time: 10:47 Had a fall or experienced change in No Accompanied By: self activities of daily living that may affect Transfer Assistance: None risk of falls: Patient Has Alerts: Yes Signs or symptoms of abuse/neglect since last visito No Patient Alerts: DMII Hospitalized since last visit: No ABI 03/06/18 L 1.28 R 1.22 Has Dressing in Place as Prescribed: Yes Pain Present Now: No Electronic Signature(s) Signed: 07/14/2018 11:42:34 AM By: Army Melia Entered By: Army Melia on 07/14/2018 10:47:20 [name], [name] ([id number]) -------------------------------------------------------------------------------- Compression Therapy Details Patient Name: [name], [name] Date of Service: 07/14/2018 10:30 AM Medical Record Number: [id number] Patient Account Number: [id number] Date of Birth/Sex: [birth year] (77 y.o. F) Treating RN: Army Melia Primary Care Saba Neuman: Johny Drilling Other Clinician: Referring Marypat Kimmet: Johny Drilling Treating Aubery Date/Extender: Melburn Hake, HOYT Weeks in Treatment: 0 Compression Therapy Performed for Wound Assessment: Wound #6 Left,Posterior Lower Leg Performed By: Clinician Army Melia, RN Compression Type: Three Layer Pre Treatment ABI: 1.3 Electronic Signature(s) Signed: 07/14/2018 11:42:34 AM By: Army Melia Entered By: Army Melia on 07/14/2018  11:00:38 [name], [name] ([id number]) -------------------------------------------------------------------------------- Encounter Discharge Information Details Patient Name: [name], [name] Date of Service: 07/14/2018 10:30 AM Medical Record Number: [id number] Patient Account Number: [id number] Date of Birth/Sex: 1940-09-21 (77 y.o. F) Treating RN: Army Melia Primary Care Wonder Donaway: Johny Drilling Other Clinician: Referring Samira Acero: Johny Drilling Treating Marven Veley/Extender: Melburn Hake, HOYT Weeks in Treatment: 0 Encounter Discharge Information Items Discharge Condition: Stable Ambulatory Status: Wheelchair Discharge Destination: Home Transportation: Private Auto Accompanied By: daughter Schedule Follow-up Appointment: Yes Clinical Summary of Care: Electronic Signature(s) Signed: 07/16/2018 8:03:17 AM By: Army Melia Entered By: Army Melia on 07/16/2018 08:03:16 [name], [name] ([id number]) -------------------------------------------------------------------------------- Wound Assessment Details Patient Name: [name], [name] Date of Service: 07/14/2018 10:30 AM Medical Record Number: [id number] Patient Account Number: [id number] Date of Birth/Sex: Oct 22, 1940 (77 y.o. F) Treating RN: Army Melia Primary Care Hamdan Toscano: Johny Drilling Other Clinician: Referring Liron Eissler: Johny Drilling Treating Vineta Carone/Extender: Melburn Hake, HOYT Weeks in Treatment: 0 Wound Status Wound Number: 6 Primary Diabetic Wound/Ulcer of the Lower Extremity Etiology: Wound Location: Left Lower Leg - Posterior Wound Open Wounding Event: Gradually Appeared Status: Date Acquired: 06/26/2018 Comorbid Cataracts, Angina, Coronary Artery Disease, Weeks Of Treatment: 0 History: Hypertension, Myocardial Infarction, Peripheral Clustered Wound: No Venous Disease, Type II Diabetes, Osteoarthritis, Neuropathy, Received Chemotherapy, Received Radiation Wound Measurements Length: (cm) 0.1 Width: (cm)  0.1 Depth: (cm) 0.1 Area: (cm) 0.008 Volume: (cm) 0.001 % Reduction in Area: 99.6% % Reduction in Volume: 99.5% Epithelialization: None Tunneling: No Undermining: No Wound Description Classification: Grade 2 Wound Margin: Flat and Intact Exudate Amount: Medium Exudate Type: Serous Exudate Color: amber Foul Odor After Cleansing: No Slough/Fibrino Yes Wound Bed Granulation Amount: Medium (34-66%) Exposed Structure Granulation Quality: Pale Fascia Exposed: No Necrotic Amount: Medium (34-66%) Fat Layer (Subcutaneous Tissue) Exposed: Yes Necrotic Quality: Eschar Tendon Exposed: No Muscle Exposed: No Joint Exposed: No Bone Exposed: No Treatment Notes Wound #6 (Left, Posterior Lower Leg) Notes silvercel, abd and bilateral 3 layer with unna  to anchor Electronic Signature(s) Signed: 07/14/2018 11:42:34 AM By: Army Melia Entered By: Army Melia on 07/14/2018 10:59:07

## 2018-07-17 ENCOUNTER — Other Ambulatory Visit: Payer: Self-pay

## 2018-07-17 ENCOUNTER — Encounter: Payer: Medicare HMO | Admitting: Physician Assistant

## 2018-07-17 DIAGNOSIS — E11621 Type 2 diabetes mellitus with foot ulcer: Secondary | ICD-10-CM | POA: Diagnosis not present

## 2018-07-18 NOTE — Progress Notes (Signed)
Theresa [name] ([id number]) Visit Report for 07/17/2018 Chief Complaint Document Details Patient Name: Theresa [name], Theresa [name]. Date of Service: 07/17/2018 1:00 PM Medical Record Number: [id number] Patient Account Number: [id number] Date of Birth/Sex: December 23, 1940 (78 y.o. F) Treating RN: Montey Hora Primary Care Provider: Johny Drilling Other Clinician: Referring Provider: Johny Drilling Treating Provider/Extender: Melburn Hake, HOYT Weeks in Treatment: 1 Information Obtained from: Patient Chief Complaint Left LE ulcers Electronic Signature(s) Signed: 07/17/2018 5:06:58 PM By: Worthy Keeler PA-C Entered By: Worthy Keeler on 07/17/2018 13:20:15 Daquila, Darcie P. ([id number]) -------------------------------------------------------------------------------- HPI Details Patient Name: [name], [name] Date of Service: 07/17/2018 1:00 PM Medical Record Number: [id number] Patient Account Number: [id number] Date of Birth/Sex: 1940-09-09 (78 y.o. F) Treating RN: Montey Hora Primary Care Provider: Johny Drilling Other Clinician: Referring Provider: Johny Drilling Treating Provider/Extender: Melburn Hake, HOYT Weeks in Treatment: 1 History of Present Illness HPI Description: 03/09/18 on evaluation today patient presents for initial inspection in our clinic concerning issues that she has been having with bilateral lower extremity ulcers. She has lymphedema as well as chronic venous stasis. She has seen Dr. dew where she's undergone ABI testing with a left ABI 1.28 a right ABI 1.22. Subsequently she is supposed to be undergoing venous studies as well which are upcoming. She does have a history of diabetes, chronic venous stasis, peripheral vascular disease, and hypertension. Currently these wounds which are currently draining are stated to have been open for roughly 2 weeks or so. It does not appear to be any evidence of infection. No fevers, chills, nausea, or vomiting noted at this time. With that  being said the patient does have some prominence to the ankle medially especially on the left side and I'm not sure if something was rubbing which calls this issue either. She is unable to get around and move very well simply due to the fact that she has an issue with her right hip as well which unfortunately prevents her from being able to move around very much. She did have an [place] placed at the request of her daughter when she was at vascular this seems to may be potentially helped a little bit. 03/16/18 on evaluation today patient's right lower extremity appears to likely be completely healed although there still small area of concern which I'm gonna continue to watch. The really does not appear to be any weeping at this point which is good news. Overall I feel like she is done excellent. In regard to left lower extremity this is a little larger still than the right although again the swelling is indeed down. The wound over the media malleolus does appear to be doing better which is good news. No fevers, chills, nausea, or vomiting noted at this time. Unfortunately the patient still continues to have right hip pain which is getting more severe. Again her surgeon did not want to undergo surgery until everything was healed as far as the wounds of the right lower extremity. 03/23/18 on evaluation today patient actually appears to be doing excellent in regard to her right lower extremity. There are no open wounds at this point in time and there is no weeping of drainage the compression wraps appear to have done very well for her. She did get the FarrowWrap 4000 Velcro compression wraps which we are gonna initiate treatment with today and this should help continue to keep swelling under control as well is the weeping. Again everything appears to be doing great in regard to the right lower  extremity. In regard to left lower extremity everything is pretty much close there are no significant open  wounds the only actual open wound that she has is on the medial left ankle which again is the opposite side from where her hip issue is and this is very superficial and shows no signs of infection at this point. 04/02/18 on evaluation today patient actually presents for an early appointment after I saw her last week due to several areas the left lower extremity which had reopened causing some issues at this point. Fortunately there does not appear to be any evidence of infection at this time. With that being said she again is waiting on everything to be better in order to be able to proceed with the surgery for her right hip as previously suggested. 04/09/18 on evaluation today patient actually appears to be doing better in regard to her left lower surety ulcers which is great news. Unfortunately she continues to have issues even on the right side there are no open wounds that she does have a blister that arose on the right at this point as well. Fortunately there's no evidence of infection currently. No fevers, chills, nausea, or vomiting noted at this time. 04/16/18 on evaluation today patient actually appears to be doing very well in regard to her left lower extremity all the wounds appear to be completely healed including the left medial malleolus which was of greatest concern. The only if and when she has some regard to the right lower extremity where there is a very superficial area of blistering which is open at this point. Fortunately there's no signs of active infection otherwise. Overall I feel like she would be very safe to proceed with that surgery at this time. 04/21/18 on evaluation today patient presents for early follow-up she was supposed to be here in two days but nonetheless she is here sooner due to the fact that she's been having issues with her right wrap which is actually causing her a lot of discomfort. She states her leg has been more swollen and painful something she really has  not experienced in recent weeks. [name], [name] ([id number]) Again this leg has been doing very well there's just a small blister that we were trading on the lateral aspect of her right leg. Again the left leg has pretty much completely resolved. Fortunately upon inspection today there does not appear to be any evidence of active infection systemically. With that being said I'm questioning whether or not she may have some cellulitis around the right lower extremity especially with the wrap somewhat pushed down and I think she may be scratching at this area. Readmission: 07/10/18 patient presents today for reevaluation our clinic concerning issues that she has been having with her bilateral lower extremities with regard to open wounds/weeping. Right now she's having more than issue on the left posterior lower extremity although there is some weeping from the right posterior lower extremity as well. Fortunately there's no signs of active infection at this time which is good news. She really has not been using lymphedema pumps on a regular basis I do believe this would be of benefit for her. It also sounds like she has not been using the compression wraps that were Velcro that we got for her last time I think this likewise would also likely done better than just doing the ace wrap or whatnot when occasionally her legs were wrapped. Either way I think right now that if we initiate compression  wrapping we can likely get things back under control for her. She needs to have this under control so she can proceed with surgery for her right hip. 07/17/18 on evaluation today patient appears to be doing excellent in regard to her bilateral lower extremity. She's been tolerating the dressing changes without complication. Fortunately there's no signs of active infection at this time. In fact the reps seem to have done your job and honestly I do not see anything open today at all which is excellent  news. Electronic Signature(s) Signed: 07/17/2018 5:06:58 PM By: Worthy Keeler PA-C Entered By: Worthy Keeler on 07/17/2018 14:10:31 Lacross, Bekki PMarland Kitchen ([id number]) -------------------------------------------------------------------------------- Physical Exam Details Patient Name: Lapier, [place] P. Date of Service: 07/17/2018 1:00 PM Medical Record Number: [id number] Patient Account Number: [id number] Date of Birth/Sex: [birth year] (78 y.o. F) Treating RN: Montey Hora Primary Care Provider: Johny Drilling Other Clinician: Referring Provider: Johny Drilling Treating Provider/Extender: Melburn Hake, HOYT Weeks in Treatment: 1 Constitutional Well-nourished and well-hydrated in no acute distress. Respiratory normal breathing without difficulty. clear to auscultation bilaterally. Cardiovascular regular rate and rhythm with normal S1, S2. Psychiatric this patient is able to make decisions and demonstrates good insight into disease process. Alert and Oriented x 3. pleasant and cooperative. Notes Upon evaluation patient's wounds all appear to be completely healed summer very newly so which makes me concerned about the possibility of reopening. For that reason I think I may want to keep her in the wraps for one more week before discharging her from that standpoint. Nonetheless she's in agreement with this plan. We will therefore see her back next week after continue with the wrap one more week. Electronic Signature(s) Signed: 07/17/2018 5:06:58 PM By: Worthy Keeler PA-C Entered By: Worthy Keeler on 07/17/2018 14:11:04 Delman, Vlada PMarland Kitchen ([id number]) -------------------------------------------------------------------------------- Physician Orders Details Patient Name: Bais, Jana P. Date of Service: 07/17/2018 1:00 PM Medical Record Number: [id number] Patient Account Number: [id number] Date of Birth/Sex: 1940/03/02 (78 y.o. F) Treating RN: Montey Hora Primary Care Provider: Johny Drilling Other Clinician: Referring Provider: Johny Drilling Treating Provider/Extender: Melburn Hake, HOYT Weeks in Treatment: 1 Verbal / Phone Orders: No Diagnosis Coding ICD-10 Coding Code Description E11.622 Type 2 diabetes mellitus with other skin ulcer I87.2 Venous insufficiency (chronic) (peripheral) L97.822 Non-pressure chronic ulcer of other part of left lower leg with fat layer exposed I10 Essential (primary) hypertension Wound Cleansing o May shower with protection. - DO NOT GET YOUR WRAPS WET Dressing Change Frequency o Change dressing every week Follow-up Appointments o Return Appointment in 1 week. Edema Control o 3 Layer Compression System - Bilateral o Elevate legs to the level of the heart and pump ankles as often as possible Electronic Signature(s) Signed: 07/17/2018 5:01:18 PM By: Montey Hora Signed: 07/17/2018 5:06:58 PM By: Worthy Keeler PA-C Entered By: Montey Hora on 07/17/2018 14:08:32 Skalla, Idona P. ([id number]) -------------------------------------------------------------------------------- Problem List Details Patient Name: Vawter, Anniece P. Date of Service: 07/17/2018 1:00 PM Medical Record Number: [id number] Patient Account Number: [id number] Date of Birth/Sex: [birth year] (78 y.o. F) Treating RN: Montey Hora Primary Care Provider: Johny Drilling Other Clinician: Referring Provider: Johny Drilling Treating Provider/Extender: Melburn Hake, HOYT Weeks in Treatment: 1 Active Problems ICD-10 Evaluated Encounter Code Description Active Date Today Diagnosis E11.622 Type 2 diabetes mellitus with other skin ulcer 07/10/2018 No Yes I87.2 Venous insufficiency (chronic) (peripheral) 07/10/2018 No Yes L97.822 Non-pressure chronic ulcer of other part of left lower leg with 07/10/2018 No Yes fat layer exposed [place] (primary) hypertension 07/10/2018  No Yes Inactive Problems Resolved Problems Electronic Signature(s) Signed: 07/17/2018 5:06:58  PM By: Worthy Keeler PA-C Entered By: Worthy Keeler on 07/17/2018 13:20:07 Preiss, Breane P. ([id number]) -------------------------------------------------------------------------------- Progress Note Details Patient Name: Bhandari, Dalonda P. Date of Service: 07/17/2018 1:00 PM Medical Record Number: [id number] Patient Account Number: [id number] Date of Birth/Sex: 21-Jul-1940 (78 y.o. F) Treating RN: Montey Hora Primary Care Provider: Johny Drilling Other Clinician: Referring Provider: Johny Drilling Treating Provider/Extender: Melburn Hake, HOYT Weeks in Treatment: 1 Subjective Chief Complaint Information obtained from Patient Left LE ulcers History of Present Illness (HPI) 03/09/18 on evaluation today patient presents for initial inspection in our clinic concerning issues that she has been having with bilateral lower extremity ulcers. She has lymphedema as well as chronic venous stasis. She has seen Dr. dew where she's undergone ABI testing with a left ABI 1.28 a right ABI 1.22. Subsequently she is supposed to be undergoing venous studies as well which are upcoming. She does have a history of diabetes, chronic venous stasis, peripheral vascular disease, and hypertension. Currently these wounds which are currently draining are stated to have been open for roughly 2 weeks or so. It does not appear to be any evidence of infection. No fevers, chills, nausea, or vomiting noted at this time. With that being said the patient does have some prominence to the ankle medially especially on the left side and I'm not sure if something was rubbing which calls this issue either. She is unable to get around and move very well simply due to the fact that she has an issue with her right hip as well which unfortunately prevents her from being able to move around very much. She did have an [place] placed at the request of her daughter when she was at vascular this seems to may be potentially helped a little  bit. 03/16/18 on evaluation today patient's right lower extremity appears to likely be completely healed although there still small area of concern which I'm gonna continue to watch. The really does not appear to be any weeping at this point which is good news. Overall I feel like she is done excellent. In regard to left lower extremity this is a little larger still than the right although again the swelling is indeed down. The wound over the media malleolus does appear to be doing better which is good news. No fevers, chills, nausea, or vomiting noted at this time. Unfortunately the patient still continues to have right hip pain which is getting more severe. Again her surgeon did not want to undergo surgery until everything was healed as far as the wounds of the right lower extremity. 03/23/18 on evaluation today patient actually appears to be doing excellent in regard to her right lower extremity. There are no open wounds at this point in time and there is no weeping of drainage the compression wraps appear to have done very well for her. She did get the FarrowWrap 4000 Velcro compression wraps which we are gonna initiate treatment with today and this should help continue to keep swelling under control as well is the weeping. Again everything appears to be doing great in regard to the right lower extremity. In regard to left lower extremity everything is pretty much close there are no significant open wounds the only actual open wound that she has is on the medial left ankle which again is the opposite side from where her hip issue is and this is very superficial and shows no signs of  infection at this point. 04/02/18 on evaluation today patient actually presents for an early appointment after I saw her last week due to several areas the left lower extremity which had reopened causing some issues at this point. Fortunately there does not appear to be any evidence of infection at this time. With that  being said she again is waiting on everything to be better in order to be able to proceed with the surgery for her right hip as previously suggested. 04/09/18 on evaluation today patient actually appears to be doing better in regard to her left lower surety ulcers which is great news. Unfortunately she continues to have issues even on the right side there are no open wounds that she does have a blister that arose on the right at this point as well. Fortunately there's no evidence of infection currently. No fevers, chills, nausea, or vomiting noted at this time. 04/16/18 on evaluation today patient actually appears to be doing very well in regard to her left lower extremity all the wounds appear to be completely healed including the left medial malleolus which was of greatest concern. The only if and when she has some regard to the right lower extremity where there is a very superficial area of blistering which is open at this point. Fortunately there's no signs of active infection otherwise. Overall I feel like she would be very safe to proceed with that Noda, Kierre P. ([id number]) surgery at this time. 04/21/18 on evaluation today patient presents for early follow-up she was supposed to be here in two days but nonetheless she is here sooner due to the fact that she's been having issues with her right wrap which is actually causing her a lot of discomfort. She states her leg has been more swollen and painful something she really has not experienced in recent weeks. Again this leg has been doing very well there's just a small blister that we were trading on the lateral aspect of her right leg. Again the left leg has pretty much completely resolved. Fortunately upon inspection today there does not appear to be any evidence of active infection systemically. With that being said I'm questioning whether or not she may have some cellulitis around the right lower extremity especially with the wrap  somewhat pushed down and I think she may be scratching at this area. Readmission: 07/10/18 patient presents today for reevaluation our clinic concerning issues that she has been having with her bilateral lower extremities with regard to open wounds/weeping. Right now she's having more than issue on the left posterior lower extremity although there is some weeping from the right posterior lower extremity as well. Fortunately there's no signs of active infection at this time which is good news. She really has not been using lymphedema pumps on a regular basis I do believe this would be of benefit for her. It also sounds like she has not been using the compression wraps that were Velcro that we got for her last time I think this likewise would also likely done better than just doing the ace wrap or whatnot when occasionally her legs were wrapped. Either way I think right now that if we initiate compression wrapping we can likely get things back under control for her. She needs to have this under control so she can proceed with surgery for her right hip. 07/17/18 on evaluation today patient appears to be doing excellent in regard to her bilateral lower extremity. She's been tolerating the dressing changes without complication. Fortunately  there's no signs of active infection at this time. In fact the reps seem to have done your job and honestly I do not see anything open today at all which is excellent news. Patient History Information obtained from Patient. Social History Never smoker, Marital Status - Married, Alcohol Use - Never, Drug Use - No History, Caffeine Use - Never. Medical History Eyes Patient has history of Cataracts - removed Denies history of Glaucoma, Optic Neuritis Ear/Nose/Mouth/Throat Denies history of Chronic sinus problems/congestion, Middle ear problems Hematologic/Lymphatic Denies history of Anemia, Hemophilia, Human Immunodeficiency Virus, Lymphedema, Sickle Cell  Disease Respiratory Denies history of Aspiration, Asthma, Chronic Obstructive Pulmonary Disease (COPD), Pneumothorax, Sleep Apnea, Tuberculosis Cardiovascular Patient has history of Angina, Coronary Artery Disease, Hypertension, Myocardial Infarction - more than 5 years ago, Peripheral Venous Disease Denies history of Arrhythmia, Congestive Heart Failure, Deep Vein Thrombosis, Hypotension, Peripheral Arterial Disease, Phlebitis, Vasculitis Gastrointestinal Denies history of Cirrhosis , Colitis, Crohn s, Hepatitis A, Hepatitis B, Hepatitis C Endocrine Patient has history of Type II Diabetes Denies history of Type I Diabetes Genitourinary Denies history of End Stage Renal Disease Immunological Denies history of Lupus Erythematosus, Raynaud s, Scleroderma Integumentary (Skin) Denies history of History of Burn, History of pressure wounds Epps, Karlie P. ([id number]) Musculoskeletal Patient has history of Osteoarthritis Denies history of Gout, Rheumatoid Arthritis, Osteomyelitis Neurologic Patient has history of Neuropathy Denies history of Dementia, Quadriplegia, Paraplegia, Seizure Disorder Oncologic Patient has history of Received Chemotherapy, Received Radiation Psychiatric Denies history of Anorexia/bulimia, Confinement Anxiety Medical And Surgical History Notes Genitourinary CKD stage 3 Oncologic 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 Respiratory Denies complaints or symptoms of Chronic or frequent coughs, Shortness of Breath. Cardiovascular Complains or has symptoms of LE edema. Denies complaints or symptoms of Chest pain. Psychiatric Denies complaints or symptoms of Anxiety, Claustrophobia. Objective Constitutional Well-nourished and well-hydrated in no acute distress. Vitals Time Taken: 1:10 PM,  Height: 67 in, Weight: 216 lbs, BMI: 33.8, Temperature: 98.2 F, Pulse: 67 bpm, Respiratory Rate: 16 breaths/min, Blood Pressure: 152/61 mmHg. Respiratory normal breathing without difficulty. clear to auscultation bilaterally. Cardiovascular regular rate and rhythm with normal S1, S2. Psychiatric this patient is able to make decisions and demonstrates good insight into disease process. Alert and Oriented x 3. pleasant and cooperative. General Notes: Upon evaluation patient's wounds all appear to be completely healed summer very newly so which makes me concerned about the possibility of reopening. For that reason I think I may want to keep her in the wraps for one more week before discharging her from that standpoint. Nonetheless she's in agreement with this plan. We will therefore see her back [place], [place]. ([id number]) next week after continue with the wrap one more week. Integumentary (Hair, Skin) Wound #6 status is Healed - Epithelialized. Original cause of wound was Gradually Appeared. The wound is located on the Left,Posterior Lower Leg. The wound measures 0cm length x 0cm width x 0cm depth; 0cm^2 area and 0cm^3 volume. There is Fat Layer (Subcutaneous Tissue) Exposed exposed. There is no tunneling or undermining noted. There is a medium amount of serous drainage noted. The wound margin is flat and intact. There is medium (34-66%) pale granulation within the wound bed. There is a medium (34-66%) amount of necrotic tissue within the wound bed including Eschar. Assessment Active Problems ICD-10 Type 2 diabetes mellitus with other skin ulcer Venous insufficiency (chronic) (  peripheral) Non-pressure chronic ulcer of other part of left lower leg with fat layer exposed Essential (primary) hypertension Procedures There was a Three Layer Compression Therapy Procedure with a pre-treatment ABI of 1.3 by Montey Hora, RN. Post procedure Diagnosis Wound #: Same as  Pre-Procedure Plan Wound Cleansing: May shower with protection. - DO NOT GET YOUR WRAPS WET Dressing Change Frequency: Change dressing every week Follow-up Appointments: Return Appointment in 1 week. Edema Control: 3 Layer Compression System - Bilateral Elevate legs to the level of the heart and pump ankles as often as possible At this point my suggestion is gonna be that we go ahead and initiate the above wound to measures for the next week this will be a continuation wraps with her lymphedema try to keep things in control. She scheduled for surgery on August 15. Obviously we need to keep things under control until then. My plan therefore will be to have her continue with the wraps for the next week that we put on. We will then transition to her Velcro compression wraps next week. I will then see her one month after that which will be about three weeks prior to her surgery to ensure everything is doing okay with Velcro wraps. If so that she should be good to go if not we may need initiate me wrapping her for a few weeks prior to surgery to keep things Genest, Maryan P. ([id number]) in control. Patient and her daughter are in agreement with plan. Thinking changes worsens meantime shall contact the office and let me know. Please see above for specific wound care orders. We will see patient for re-evaluation in 1 week(s) here in the clinic. If anything worsens or changes patient will contact our office for additional recommendations. Electronic Signature(s) Signed: 07/17/2018 5:06:58 PM By: Worthy Keeler PA-C Entered By: Worthy Keeler on 07/17/2018 14:11:59 Guile, [place] PMarland Kitchen ([id number]) -------------------------------------------------------------------------------- ROS/PFSH Details Patient Name: Walkowski, Female P. Date of Service: 07/17/2018 1:00 PM Medical Record Number: [id number] Patient Account Number: [id number] Date of Birth/Sex: [birth year] (78 y.o. F) Treating RN: Montey Hora Primary Care Provider: Johny Drilling Other Clinician: Referring Provider: Johny Drilling Treating Provider/Extender: Melburn Hake, HOYT Weeks in Treatment: 1 Information Obtained From Patient Constitutional Symptoms (General Health) Complaints and Symptoms: Negative for: Fatigue; Fever; Chills; Marked Weight Change Respiratory Complaints and Symptoms: Negative for: Chronic or frequent coughs; Shortness of Breath Medical History: Negative for: Aspiration; Asthma; Chronic Obstructive Pulmonary Disease (COPD); Pneumothorax; Sleep Apnea; Tuberculosis Cardiovascular Complaints and Symptoms: Positive for: LE edema Negative for: Chest pain Medical History: Positive for: Angina; Coronary Artery Disease; Hypertension; Myocardial Infarction - more than 5 years ago; Peripheral Venous Disease Negative for: Arrhythmia; Congestive Heart Failure; Deep Vein Thrombosis; Hypotension; Peripheral Arterial Disease; Phlebitis; Vasculitis Psychiatric Complaints and Symptoms: Negative for: Anxiety; Claustrophobia Medical History: Negative for: Anorexia/bulimia; Confinement Anxiety Eyes Medical History: Positive for: Cataracts - removed Negative for: Glaucoma; Optic Neuritis Ear/Nose/Mouth/Throat Medical History: Negative for: Chronic sinus problems/congestion; Middle ear problems Hematologic/Lymphatic ZAMARA, COZAD. ([id number]) Medical History: Negative for: Anemia; Hemophilia; Human Immunodeficiency Virus; Lymphedema; Sickle Cell Disease Gastrointestinal Medical History: Negative for: Cirrhosis ; Colitis; Crohnos; Hepatitis A; Hepatitis B; Hepatitis C Endocrine Medical History: Positive for: Type II Diabetes Negative for: Type I Diabetes Treated with: Insulin Blood sugar tested every day: Yes Tested : Blood sugar testing results: Breakfast: 211; Lunch: 240 Genitourinary Medical History: Negative for: End Stage Renal Disease Past Medical History Notes: CKD stage  3 Immunological Medical History: Negative for: Lupus Erythematosus; Raynaudos;  Scleroderma Integumentary (Skin) Medical History: Negative for: History of Burn; History of pressure wounds Musculoskeletal Medical History: Positive for: Osteoarthritis Negative for: Gout; Rheumatoid Arthritis; Osteomyelitis Neurologic Medical History: Positive for: Neuropathy Negative for: Dementia; Quadriplegia; Paraplegia; Seizure Disorder Oncologic Medical History: Positive for: Received Chemotherapy; Received Radiation Past Medical History Notes: 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 ([id number]) Immunizations Pneumococcal Vaccine: Received Pneumococcal Vaccination: Yes Implantable Devices No devices added Family and Social History Never smoker; Marital Status - Married; Alcohol Use: Never; Drug Use: No History; Caffeine Use: Never; Financial Concerns: No; Food, Clothing or Shelter Needs: No; Support System Lacking: No; Transportation Concerns: No Physician Affirmation I have reviewed and agree with the above information. Electronic Signature(s) Signed: 07/17/2018 5:01:18 PM By: Montey Hora Signed: 07/17/2018 5:06:58 PM By: Worthy Keeler PA-C Entered By: Worthy Keeler on 07/17/2018 14:10:48 Saxton, Tomeshia P. ([id number]) -------------------------------------------------------------------------------- SuperBill Details Patient Name: Goecke, Kahlea P. Date of Service: 07/17/2018 Medical Record Number: [id number] Patient Account Number: [id number] Date of Birth/Sex: [birth year] (78 y.o. F) Treating RN: Montey Hora Primary Care Provider: Johny Drilling Other Clinician: Referring Provider: Johny Drilling Treating Provider/Extender: Melburn Hake, HOYT Weeks in Treatment: 1 Diagnosis Coding ICD-10 Codes Code Description E11.622 Type 2 diabetes mellitus with other skin ulcer I87.2 Venous insufficiency  (chronic) (peripheral) L97.822 Non-pressure chronic ulcer of other part of left lower leg with fat layer exposed I10 Essential (primary) hypertension Facility Procedures CPT4: Description Modifier Quantity Code [id number] BILATERAL: Application of multi-layer venous compression system; leg (below 1 knee), including ankle and foot. Physician Procedures CPT4 Code Description: 9373428 [id number] - WC PHYS LEVEL 4 - EST PT ICD-10 Diagnosis Description E11.622 Type 2 diabetes mellitus with other skin ulcer I87.2 Venous insufficiency (chronic) (peripheral) L97.822 Non-pressure chronic ulcer of other part of left  lower leg wit I10 Essential (primary) hypertension Modifier: h fat layer expos Quantity: 1 ed Electronic Signature(s) Signed: 07/17/2018 5:06:58 PM By: Worthy Keeler PA-C Previous Signature: 07/17/2018 2:11:44 PM Version By: Montey Hora Entered By: Worthy Keeler on 07/17/2018 14:12:14

## 2018-07-18 NOTE — Progress Notes (Addendum)
RYLAN, KAUFMANN ([id number]) Visit Report for 07/17/2018 Arrival Information Details Patient Name: Theresa [name], Theresa [name]. Date of Service: 07/17/2018 1:00 PM Medical Record Number: [id number] Patient Account Number: [id number] Date of Birth/Sex: 09-17-1940 (78 y.o. F) Treating RN: Army Melia Primary Care Harryette Shuart: Johny Drilling Other Clinician: Referring Ignace Mandigo: Johny Drilling Treating Stasha Naraine/Extender: Melburn Hake, HOYT Weeks in Treatment: 1 Visit Information History Since Last Visit Added or deleted any medications: No Patient Arrived: Wheel Chair Any new allergies or adverse reactions: No Arrival Time: 13:09 Had a fall or experienced change in No Accompanied By: daughter activities of daily living that may affect Transfer Assistance: None risk of falls: Patient Has Alerts: Yes Signs or symptoms of abuse/neglect since last visito No Patient Alerts: DMII Has Dressing in Place as Prescribed: Yes ABI 03/06/18 L 1.28 R 1.22 Pain Present Now: No Electronic Signature(s) Signed: 07/17/2018 4:22:16 PM By: Army Melia Entered By: Army Melia on 07/17/2018 13:10:08 [name], [name] ([id number]) -------------------------------------------------------------------------------- Compression Therapy Details Patient Name: Mott, Allis P. Date of Service: 07/17/2018 1:00 PM Medical Record Number: [id number] Patient Account Number: [id number] Date of Birth/Sex: 05/11/1940 (78 y.o. F) Treating RN: Montey Hora Primary Care Allycia Pitz: Johny Drilling Other Clinician: Referring Jericka Kadar: Johny Drilling Treating Lejend Dalby/Extender: Melburn Hake, HOYT Weeks in Treatment: 1 Compression Therapy Performed for Wound Assessment: NonWound Condition Lymphedema - Bilateral Leg Performed By: Clinician Montey Hora, RN Compression Type: Three Layer Pre Treatment ABI: 1.3 Post Procedure Diagnosis Same as Pre-procedure Electronic Signature(s) Signed: 07/17/2018 5:01:18 PM By: Montey Hora Entered By:  Montey Hora on 07/17/2018 14:09:59 Theresa [name], [name] ([id number]) -------------------------------------------------------------------------------- Encounter Discharge Information Details Patient Name: Mcnelly, Briahnna P. Date of Service: 07/17/2018 1:00 PM Medical Record Number: [id number] Patient Account Number: [id number] Date of Birth/Sex: 1940-09-22 (78 y.o. F) Treating RN: Montey Hora Primary Care Lashaye Fisk: Johny Drilling Other Clinician: Referring Kamy Poinsett: Johny Drilling Treating Goldie Tregoning/Extender: Melburn Hake, HOYT Weeks in Treatment: 1 Encounter Discharge Information Items Discharge Condition: Stable Ambulatory Status: Wheelchair Discharge Destination: Home Transportation: Private Auto Accompanied By: daughter Schedule Follow-up Appointment: Yes Clinical Summary of Care: Electronic Signature(s) Signed: 07/17/2018 2:12:54 PM By: Montey Hora Entered By: Montey Hora on 07/17/2018 14:12:54 Theresa [name], [name] ([id number]) -------------------------------------------------------------------------------- Lower Extremity Assessment Details Patient Name: Koenig, Sherrey P. Date of Service: 07/17/2018 1:00 PM Medical Record Number: [id number] Patient Account Number: [id number] Date of Birth/Sex: May 31, 1940 (78 y.o. F) Treating RN: Army Melia Primary Care Talar Fraley: Johny Drilling Other Clinician: Referring Shanterria Franta: Johny Drilling Treating Preesha Benjamin/Extender: STONE III, HOYT Weeks in Treatment: 1 Edema Assessment Assessed: [Left: No] [Right: No] Edema: [Left: No] [Right: No] Vascular Assessment Pulses: Dorsalis Pedis Palpable: [Left:Yes] [Right:Yes] Electronic Signature(s) Signed: 07/17/2018 4:22:16 PM By: Army Melia Entered By: Army Melia on 07/17/2018 13:24:11 Lamphear, Seneca. ([id number]) -------------------------------------------------------------------------------- Multi Wound Chart Details Patient Name: Totman, Suhana P. Date of Service: 07/17/2018 1:00  PM Medical Record Number: [id number] Patient Account Number: [id number] Date of Birth/Sex: December 08, 1940 (78 y.o. 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] [N/A:N/A] Comorbid History: [6:Cataracts, Angina, Coronary Artery Disease, Hypertension, Myocardial Infarction, Peripheral Venous Disease, Type II Diabetes, Osteoarthritis, Neuropathy, Received Chemotherapy, Received Radiation] [N/A:N/A] Date Acquired: [6:06/26/2018] [N/A:N/A] Weeks of Treatment: [  6:1] [N/A:N/A] Wound Status: [6:Healed - Epithelialized] [N/A:N/A] Measurements L x W x D [6:0x0x0] [N/A:N/A] (cm) Area (cm) : [6:0] [N/A:N/A] Volume (cm) : [6:0] [N/A:N/A] % Reduction in Area: [6:100.00%] [N/A:N/A] % Reduction in Volume: [6:100.00%] [N/A:N/A] Classification: [6:Grade 2] [N/A:N/A] Exudate Amount: [6:Medium] [N/A:N/A] Exudate Type: [6:Serous] [N/A:N/A] Exudate Color: [6:amber] [N/A:N/A] Wound Margin: [6:Flat and Intact] [N/A:N/A] Granulation Amount: [6:Medium (34-66%)] [N/A:N/A] Granulation Quality: [6:Pale] [N/A:N/A] Necrotic Amount: [6:Medium (34-66%)] [N/A:N/A] Necrotic Tissue: [6:Eschar] [N/A:N/A] Exposed Structures: [6:Fat Layer (Subcutaneous Tissue) Exposed: Yes Fascia: No Tendon: No Muscle: No] [N/A:N/A] Joint: No Bone: No Epithelialization: None N/A N/A Treatment Notes Electronic Signature(s) Signed: 07/17/2018 5:01:18 PM By: Montey Hora Entered By: Montey Hora on 07/17/2018  14:07:37 Theresa [name], Theresa [name] Kitchen ([id number]) -------------------------------------------------------------------------------- Multi-Disciplinary Care Plan Details Patient Name: Seidner, Maleiya P. Date of Service: 07/17/2018 1:00 PM Medical Record Number: [id number] Patient Account Number: [id number] Date of Birth/Sex: September 15, 1940 (78 y.o. F) Treating RN: Montey Hora Primary Care Alline Pio: Johny Drilling Other Clinician: Referring Elisia Stepp: Johny Drilling Treating Monzerrath Mcburney/Extender: Melburn Hake, HOYT Weeks in Treatment: 1 Active Inactive Electronic Signature(s) Signed: 07/29/2018 6:19:42 PM By: Gretta Cool BSN, RN, CWS, Kim RN, BSN Signed: 09/25/2018 1:03:09 PM By: Montey Hora Previous Signature: 07/17/2018 5:01:18 PM Version By: Montey Hora Entered By: Gretta Cool BSN, RN, CWS, Kim on 07/29/2018 18:19:42 Theresa [name], [name] ([id number]) -------------------------------------------------------------------------------- Pain Assessment Details Patient Name: Robledo, Wai P. Date of Service: 07/17/2018 1:00 PM Medical Record Number: [id number] Patient Account Number: [id number] Date of Birth/Sex: 1940/05/13 (78 y.o. F) Treating RN: Army Melia Primary Care Bertrice Leder: Johny Drilling Other Clinician: Referring Mahalie Kanner: Johny Drilling Treating Brandi Tomlinson/Extender: Melburn Hake, HOYT Weeks in Treatment: 1 Active Problems Location of Pain Severity and Description of Pain Patient Has Paino No Site Locations Pain Management and Medication Current Pain Management: Electronic Signature(s) Signed: 07/17/2018 4:22:16 PM By: Army Melia Entered By: Army Melia on 07/17/2018 13:10:18 Theresa [name], Theresa Eye. ([id number]) -------------------------------------------------------------------------------- Patient/Caregiver Education Details Patient Name: Valverde, Bristyl P. Date of Service: 07/17/2018 1:00 PM Medical Record Number: [id number] Patient Account Number: [id number] Date of Birth/Gender: 06/15/1940 (79 y.o.  F) Treating RN: Montey Hora Primary Care Physician: Johny Drilling Other Clinician: Referring Physician: Johny Drilling Treating Physician/Extender: Sharalyn Ink in Treatment: 1 Education Assessment Education Provided To: Patient and Caregiver Education Topics Provided Venous: Handouts: Other: ongoing compression Methods: Explain/Verbal Responses: State content correctly Electronic Signature(s) Signed: 07/17/2018 5:01:18 PM By: Montey Hora Entered By: Montey Hora on 07/17/2018 14:12:02 Theresa [name], [name] ([id number]) -------------------------------------------------------------------------------- Wound Assessment Details Patient Name: Kreuzer, Sylvia P. Date of Service: 07/17/2018 1:00 PM Medical Record Number: [id number] Patient Account Number: [id number] Date of Birth/Sex: May 01, 1940 (78 y.o. F) Treating RN: Montey Hora Primary Care Manda Holstad: Johny Drilling Other Clinician: Referring Maclean Foister: Johny Drilling Treating Caterina Racine/Extender: Melburn Hake, HOYT Weeks in Treatment: 1 Wound Status Wound Number: 6 Primary Diabetic Wound/Ulcer of the Lower Extremity Etiology: Wound Location: Left, Posterior Lower Leg Wound Healed - Epithelialized Wounding Event: Gradually Appeared Status: Date Acquired: 06/26/2018 Comorbid Cataracts, Angina, Coronary Artery Disease, Weeks Of Treatment: 1 History: Hypertension, Myocardial Infarction, Peripheral Clustered Wound: No Venous Disease, Type II Diabetes, Osteoarthritis, Neuropathy, Received Chemotherapy, Received Radiation Photos Photo Uploaded By: Army Melia on 07/17/2018 15:04:30 Wound Measurements Length: (cm) 0 % Reduct Width: (cm) 0 % Reduct Depth: (cm) 0 Epitheli Area: (cm) 0 Tunneli Volume: (cm) 0 Undermi ion in Area: 100% ion in Volume: 100% alization: None ng: No ning: No Wound Description Classification: Grade 2 Foul Odo Wound Margin: Flat and Intact Slough/F Exudate Amount: Medium Exudate Type:  Serous Exudate Color: amber r After Cleansing: No ibrino Yes Wound Bed Granulation Amount: Medium (34-66%) Exposed Structure Granulation Quality: Pale Fascia Exposed: No Necrotic Amount: Medium (34-66%) Fat Layer (Subcutaneous Tissue) Exposed: Yes Necrotic Quality: Eschar Tendon Exposed: No Muscle Exposed: No Joint Exposed: No Bone Exposed: No Carpino, Gracelynn P. ([id number]) Electronic Signature(s) Signed: 07/17/2018 5:01:18 PM By: Montey Hora Entered By: Montey Hora on 07/17/2018 14:03:37 Billingham, Gordie P. ([id number]) -------------------------------------------------------------------------------- Vitals Details Patient Name: Benito, Kathreen P. Date of Service: 07/17/2018 1:00 PM Medical Record Number: [id number] Patient Account Number: [id number] Date of Birth/Sex: [birth year] (78 y.o. F) Treating RN: Army Melia Primary Care Jannie Doyle: Johny Drilling Other Clinician: Referring Jannah Guardiola: Johny Drilling Treating Shali Vesey/Extender: Melburn Hake, HOYT Weeks in Treatment: 1 Vital Signs Time Taken: 13:10 Temperature (F): 98.2 Height (in): 67 Pulse (bpm): 67 Weight (lbs): 216 Respiratory Rate (breaths/min): 16 Body Mass Index (BMI): 33.8 Blood Pressure (mmHg): 152/61 Reference Range: 80 - 120 mg / dl Electronic Signature(s) Signed: 07/17/2018 4:22:16 PM By: Army Melia Entered By: Army Melia on 07/17/2018 13:13:35

## 2018-07-24 ENCOUNTER — Ambulatory Visit: Payer: Medicare HMO | Admitting: Physician Assistant

## 2018-08-14 ENCOUNTER — Other Ambulatory Visit: Payer: Self-pay

## 2018-08-14 ENCOUNTER — Encounter: Payer: Medicare HMO | Attending: Physician Assistant | Admitting: Physician Assistant

## 2018-08-14 DIAGNOSIS — L97929 Non-pressure chronic ulcer of unspecified part of left lower leg with unspecified severity: Secondary | ICD-10-CM | POA: Diagnosis present

## 2018-08-14 DIAGNOSIS — E11622 Type 2 diabetes mellitus with other skin ulcer: Secondary | ICD-10-CM | POA: Diagnosis not present

## 2018-08-14 DIAGNOSIS — E1151 Type 2 diabetes mellitus with diabetic peripheral angiopathy without gangrene: Secondary | ICD-10-CM | POA: Insufficient documentation

## 2018-08-14 DIAGNOSIS — L97822 Non-pressure chronic ulcer of other part of left lower leg with fat layer exposed: Secondary | ICD-10-CM | POA: Insufficient documentation

## 2018-08-14 DIAGNOSIS — I10 Essential (primary) hypertension: Secondary | ICD-10-CM | POA: Diagnosis not present

## 2018-08-14 DIAGNOSIS — I872 Venous insufficiency (chronic) (peripheral): Secondary | ICD-10-CM | POA: Insufficient documentation

## 2018-08-14 DIAGNOSIS — I890 Lymphedema, not elsewhere classified: Secondary | ICD-10-CM | POA: Diagnosis not present

## 2018-08-14 DIAGNOSIS — L98492 Non-pressure chronic ulcer of skin 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: Secondary | ICD-10-CM | POA: Insufficient documentation

## 2018-08-14 DIAGNOSIS — E1136 Type 2 diabetes mellitus with diabetic cataract: Secondary | ICD-10-CM | POA: Insufficient documentation

## 2018-08-14 DIAGNOSIS — S61401A Unspecified open wound of right hand, initial encounter: Secondary | ICD-10-CM | POA: Insufficient documentation

## 2018-08-14 DIAGNOSIS — L97522 Non-pressure chronic ulcer of other part of left foot with fat layer exposed: Secondary | ICD-10-CM | POA: Insufficient documentation

## 2018-08-14 DIAGNOSIS — X58XXXA Exposure to other specified factors, initial encounter: Secondary | ICD-10-CM | POA: Diagnosis not present

## 2018-08-16 NOTE — Progress Notes (Signed)
JAMAICA, INTHAVONG ([id number]) Visit Report for 08/14/2018 Arrival Information Details Patient Name: Theresa [name], Theresa [name]. Date of Service: 08/14/2018 9:15 AM Medical Record Number: [id number] Patient Account Number: [id number] Date of Birth/Sex: Jun 26, 1940 (78 y.o. F) Treating RN: Harold Barban Primary Care Ineze Serrao: Johny Drilling Other Clinician: Referring Sariyah Corcino: Johny Drilling Treating Keshawna Dix/Extender: Melburn Hake, HOYT Weeks in Treatment: 5 Visit Information History Since Last Visit Added or deleted any medications: No Patient Arrived: Wheel Chair Any new allergies or adverse reactions: No Arrival Time: 09:10 Had a fall or experienced change in No Accompanied By: daughter activities of daily living that may affect Transfer Assistance: EasyPivot Patient Lift risk of falls: Patient Identification Verified: Yes Signs or symptoms of abuse/neglect since last visito No Secondary Verification Process Yes Hospitalized since last visit: No Completed: Has Dressing in Place as Prescribed: Yes Patient Has Alerts: Yes Pain Present Now: Yes Patient Alerts: DMII ABI 03/06/18 L 1.28 R 1.22 Electronic Signature(s) Signed: 08/14/2018 5:22:32 PM By: Harold Barban Entered By: Harold Barban on 08/14/2018 09:12:21 Tauer, Leonila [name]. ([id number]) -------------------------------------------------------------------------------- Compression Therapy Details Patient Name: Theresa [name], Theresa [name]. Date of Service: 08/14/2018 9:15 AM Medical Record Number: [id number] Patient Account Number: [id number] Date of Birth/Sex: November 23, 1940 (78 y.o. F) Treating RN: Montey Hora Primary Care Brittanyann Wittner: Johny Drilling Other Clinician: Referring Idell Hissong: Johny Drilling Treating Hani Patnode/Extender: Melburn Hake, HOYT Weeks in Treatment: 5 Compression Therapy Performed for Wound Assessment: Wound #1 Left,Medial Malleolus Performed By: Clinician Montey Hora, RN Compression Type: Three Layer Pre Treatment ABI: 1.2 Post  Procedure Diagnosis Same as Pre-procedure Electronic Signature(s) Signed: 08/14/2018 5:37:40 PM By: Montey Hora Entered By: Montey Hora on 08/14/2018 09:55:04 Castrellon, Crislyn [name]. ([id number]) -------------------------------------------------------------------------------- Compression Therapy Details Patient Name: Theresa [name], Theresa [name]. Date of Service: 08/14/2018 9:15 AM Medical Record Number: [id number] Patient Account Number: [id number] Date of Birth/Sex: July 30, 1940 (78 y.o. F) Treating RN: Montey Hora Primary Care Ronnesha Mester: Johny Drilling Other Clinician: Referring Jakhai Fant: Johny Drilling Treating Adith Tejada/Extender: Melburn Hake, HOYT Weeks in Treatment: 5 Compression Therapy Performed for Wound Assessment: Wound #6 Left,Posterior Lower Leg Performed By: Clinician Montey Hora, RN Compression Type: Three Layer Pre Treatment ABI: 1.2 Post Procedure Diagnosis Same as Pre-procedure Electronic Signature(s) Signed: 08/14/2018 5:37:40 PM By: Montey Hora Entered By: Montey Hora on 08/14/2018 09:55:05 Padula, Arminta [name]. ([id number]) -------------------------------------------------------------------------------- Compression Therapy Details Patient Name: Theresa [name], Theresa [name]. Date of Service: 08/14/2018 9:15 AM Medical Record Number: [id number] Patient Account Number: [id number] Date of Birth/Sex: 1940/05/21 (78 y.o. F) Treating RN: Montey Hora Primary Care Asya Derryberry: Johny Drilling Other Clinician: Referring Diquan Kassis: Johny Drilling Treating Sabir Charters/Extender: Melburn Hake, HOYT Weeks in Treatment: 5 Compression Therapy Performed for Wound Assessment: Wound #7 Left,Dorsal Foot Performed By: Clinician Montey Hora, RN Compression Type: Three Layer Pre Treatment ABI: 1.2 Post Procedure Diagnosis Same as Pre-procedure Electronic Signature(s) Signed: 08/14/2018 5:37:40 PM By: Montey Hora Entered By: Montey Hora on 08/14/2018 09:55:05 Apostol, Lahna [name].  ([id number]) -------------------------------------------------------------------------------- Compression Therapy Details Patient Name: Theresa [name], Theresa [name]. Date of Service: 08/14/2018 9:15 AM Medical Record Number: [id number] Patient Account Number: [id number] Date of Birth/Sex: 1940-08-02 (78 y.o. F) Treating RN: Montey Hora Primary Care Adonna Horsley: Johny Drilling Other Clinician: Referring Rolando Hessling: Johny Drilling Treating Sela Falk/Extender: Melburn Hake, HOYT Weeks in Treatment: 5 Compression Therapy Performed for Wound Assessment: Wound #8 Right,Anterior Lower Leg Performed By: Clinician Montey Hora, RN Compression Type: Three Layer Pre Treatment ABI: 1.2 Post Procedure Diagnosis Same as Pre-procedure Electronic Signature(s) Signed: 08/14/2018 5:37:40 PM By: Montey Hora Entered By: Montey Hora on 08/14/2018 09:55:05 Davoli, Jazmin [name]. ([id number]) --------------------------------------------------------------------------------  Encounter Discharge Information Details Patient Name: Theresa [name], Theresa [name]. Date of Service: 08/14/2018 9:15 AM Medical Record Number: [id number] Patient Account Number: [id number] Date of Birth/Sex: 11/13/1940 (78 y.o. F) Treating RN: Montey Hora Primary Care Carmello Cabiness: Johny Drilling Other Clinician: Referring Leisa Gault: Johny Drilling Treating Emilija Bohman/Extender: Melburn Hake, HOYT Weeks in Treatment: 5 Encounter Discharge Information Items Post Procedure Vitals Discharge Condition: Stable Temperature (F): 97.8 Ambulatory Status: Wheelchair Pulse (bpm): 74 Discharge Destination: Home Respiratory Rate (breaths/min): 16 Transportation: Private Auto Blood Pressure (mmHg): 145/60 Accompanied By: daughter Schedule Follow-up Appointment: Yes Clinical Summary of Care: Electronic Signature(s) Signed: 08/14/2018 5:37:40 PM By: Montey Hora Entered By: Montey Hora on 08/14/2018 09:57:30 Grotz, Canna [name].  ([id number]) -------------------------------------------------------------------------------- Lower Extremity Assessment Details Patient Name: Theresa [name], Theresa [name]. Date of Service: 08/14/2018 9:15 AM Medical Record Number: [id number] Patient Account Number: [id number] Date of Birth/Sex: [birth year] (78 y.o. F) Treating RN: Harold Barban Primary Care Nakiah Osgood: Johny Drilling Other Clinician: Referring Tyric Rodeheaver: Johny Drilling Treating Malaiya Paczkowski/Extender: Melburn Hake, HOYT Weeks in Treatment: 5 Edema Assessment Assessed: [Left: No] [Right: No] Edema: [Left: Yes] [Right: Yes] Calf Left: Right: Point of Measurement: 34 cm From Medial Instep 43 cm 41 cm Ankle Left: Right: Point of Measurement: 11 cm From Medial Instep 24 cm 25 cm Vascular Assessment Pulses: Dorsalis Pedis Palpable: [Left:Yes] [Right:Yes] Posterior Tibial Palpable: [Left:Yes] [Right:Yes] Electronic Signature(s) Signed: 08/14/2018 5:22:32 PM By: Harold Barban Entered By: Harold Barban on 08/14/2018 09:32:57 Venable, Tempie [name]. ([id number]) -------------------------------------------------------------------------------- Multi Wound Chart Details Patient Name: Theresa [name], Theresa [name]. Date of Service: 08/14/2018 9:15 AM Medical Record Number: [id number] Patient Account Number: [id number] Date of Birth/Sex: 1940-03-19 (78 y.o. F) Treating RN: Montey Hora Primary Care Tyson Masin: Johny Drilling Other Clinician: Referring Wilber Fini: Johny Drilling Treating Jyaire Koudelka/Extender: Melburn Hake, HOYT Weeks in Treatment: 5 Vital Signs Height(in): 56 Pulse(bpm): 80 Weight(lbs): 216 Blood Pressure(mmHg): 145/60 Body Mass Index(BMI): 34 Temperature(F): 97.8 Respiratory Rate 20 (breaths/min): Photos: [3:No Photos] Wound Location: Left Malleolus - Medial Left Lower Leg - Medial Left Lower Leg - Posterior Wounding Event: Gradually Appeared Gradually Appeared Gradually Appeared Primary Etiology: Diabetic Wound/Ulcer of the Diabetic  Wound/Ulcer of the Diabetic Wound/Ulcer of the Lower Extremity Lower Extremity Lower Extremity Secondary Etiology: Venous Leg Ulcer N/A N/A Comorbid History: Cataracts, Angina, Coronary Cataracts, Angina, Coronary Cataracts, Angina, Coronary Artery Disease, Hypertension, Artery Disease, Hypertension, Artery Disease, Hypertension, Myocardial Infarction, Myocardial Infarction, Myocardial Infarction, Peripheral Venous Disease, Peripheral Venous Disease, Peripheral Venous Disease, Type II Diabetes, Type II Diabetes, Type II Diabetes, Osteoarthritis, Neuropathy, Osteoarthritis, Neuropathy, Osteoarthritis, Neuropathy, Received Chemotherapy, Received Chemotherapy, Received Chemotherapy, Received Radiation Received Radiation Received Radiation Date Acquired: 12/07/2017 01/29/2016 06/26/2018 Weeks of Treatment: 22 19 5  Wound Status: Open Healed - Epithelialized Open Measurements L x W x D 2.5x3.5x0.1 0x0x0 0.3x0.3x0.1 (cm) Area (cm) : 6.872 0 0.071 Volume (cm) : 0.687 0 0.007 % Reduction in Area: -994.30% -2561.50% 96.40% % Reduction in Volume: -990.50% -2596.80% 96.40% Classification: Grade 2 Grade 2 Grade 2 Exudate Amount: Large Large Medium Exudate Type: Serous Serous Serous Exudate Color: amber amber amber Wound Margin: Flat and Intact Flat and Intact Flat and Intact Granulation Amount: Small (1-33%) Small (1-33%) Medium (34-66%) Granulation Quality: Pink Pale Pale Mckercher, Lilah [name]. ([id number]) Necrotic Amount: Medium (34-66%) Medium (34-66%) Medium (34-66%) Necrotic Tissue: Adherent [place]ocket Eschar Exposed Structures: Fat Layer (Subcutaneous Fat Layer (Subcutaneous Fat Layer (Subcutaneous Tissue) Exposed: Yes Tissue) Exposed: Yes Tissue) Exposed: Yes Fascia: No Fascia: No Fascia: No Tendon: No Tendon: No Tendon: No Muscle: No Muscle: No Muscle: No Joint:  No Joint: No Joint: No Bone: No Bone: No Bone: No Epithelialization: N/A None None Wound Number: 7 8  9  Photos: Wound Location: Left Foot - Dorsal Right Lower Leg - Anterior Right Hand - 2nd Digit Wounding Event: Gradually Appeared Gradually Appeared Trauma Primary Etiology: Diabetic Wound/Ulcer of the Diabetic Wound/Ulcer of the Trauma, Other Lower Extremity Lower Extremity Secondary Etiology: N/A N/A N/A Comorbid History: Cataracts, Angina, Coronary Cataracts, Angina, Coronary Cataracts, Angina, Coronary Artery Disease, Hypertension, Artery Disease, Hypertension, Artery Disease, Hypertension, Myocardial Infarction, Myocardial Infarction, Myocardial Infarction, Peripheral Venous Disease, Peripheral Venous Disease, Peripheral Venous Disease, Type II Diabetes, Type II Diabetes, Type II Diabetes, Osteoarthritis, Neuropathy, Osteoarthritis, Neuropathy, Osteoarthritis, Neuropathy, Received Chemotherapy, Received Chemotherapy, Received Chemotherapy, Received Radiation Received Radiation Received Radiation Date Acquired: 07/29/2018 07/29/2018 08/07/2018 Weeks of Treatment: 0 0 0 Wound Status: Open Open Open Measurements L x W x D 3.2x2x0.1 1.2x2x0.1 1x1x0.2 (cm) Area (cm) : 5.027 1.885 0.785 Volume (cm) : 0.503 0.188 0.157 % Reduction in Area: 0.00% 0.00% 0.00% % Reduction in Volume: 0.00% 0.00% 0.00% Classification: Grade 2 Grade 2 Full Thickness Without Exposed Support Structures Exudate Amount: Large Large Small Exudate Type: Serous Serous Serosanguineous Exudate Color: amber amber red, brown Wound Margin: Flat and Intact N/A Thickened Granulation Amount: Small (1-33%) Small (1-33%) None Present (0%) Granulation Quality: Pink Pink N/A Necrotic Amount: Medium (34-66%) Medium (34-66%) Large (67-100%) Necrotic Tissue: Eschar, Adherent Slough Eschar, Adherent [place] Exposed Structures: Fat Layer (Subcutaneous Fat Layer (Subcutaneous Fat Layer (Subcutaneous Tissue) Exposed: Yes Tissue) Exposed: Yes Tissue) Exposed: Yes Fascia: No Fascia: No Fascia: No Tendon:  No Tendon: No Tendon: No Muscle: No Muscle: No Muscle: No Barbe, Mickala [name]. ([id number]) Joint: No Joint: No Joint: No Bone: No Bone: No Bone: No Epithelialization: Small (1-33%) N/A None Treatment Notes Electronic Signature(s) Signed: 08/14/2018 5:37:40 PM By: Montey Hora Entered By: Montey Hora on 08/14/2018 09:41:28 Hailes, Cesiah Mamie Nick ([id number]) -------------------------------------------------------------------------------- Multi-Disciplinary Care Plan Details Patient Name: Theresa [name], Theresa [name]. Date of Service: 08/14/2018 9:15 AM Medical Record Number: [id number] Patient Account Number: [id number] Date of Birth/Sex: 1940/04/24 (78 y.o. F) Treating RN: Montey Hora Primary Care Devesh Monforte: Johny Drilling Other Clinician: Referring Osmara Drummonds: Johny Drilling Treating Shaunte Weissinger/Extender: Melburn Hake, HOYT Weeks in Treatment: 5 Active Inactive Abuse / Safety / Falls / Self Care Management Nursing Diagnoses: Impaired physical mobility Goals: Patient will not develop complications from immobility Date Initiated: 07/10/2018 Target Resolution Date: 11/27/2018 Goal Status: Active Interventions: Assess fall risk on admission and as needed Notes: Venous Leg Ulcer Nursing Diagnoses: Actual venous Insuffiency (use after diagnosis is confirmed) Goals: Patient will maintain optimal edema control Date Initiated: 07/10/2018 Target Resolution Date: 11/27/2018 Goal Status: Active Interventions: Compression as ordered Provide education on venous insufficiency Notes: Wound/Skin Impairment Nursing Diagnoses: Impaired tissue integrity Goals: Ulcer/skin breakdown will heal within 14 weeks Date Initiated: 07/10/2018 Target Resolution Date: 11/27/2018 Goal Status: Active Interventions: SECILIA, APPS ([id number]) Assess patient/caregiver ability to obtain necessary supplies Assess patient/caregiver ability to perform ulcer/skin care regimen upon admission and as needed Assess  ulceration(s) every visit Notes: Electronic Signature(s) Signed: 08/14/2018 5:37:40 PM By: Montey Hora Entered By: Montey Hora on 08/14/2018 09:41:18 Duesing, Hanna. ([id number]) -------------------------------------------------------------------------------- Pain Assessment Details Patient Name: Wyatt, Ailed [name]. Date of Service: 08/14/2018 9:15 AM Medical Record Number: [id number] Patient Account Number: [id number] Date of Birth/Sex: August 26, 1940 (78 y.o. F) Treating RN: Harold Barban Primary Care Ambrosio Reuter: Johny Drilling Other Clinician: Referring Lalita Ebel: Johny Drilling Treating Concettina Leth/Extender: Melburn Hake, HOYT Weeks in Treatment: 5 Active Problems  Location of Pain Severity and Description of Pain Patient Has Paino Yes Site Locations Rate the pain. Current Pain Level: 10 Pain Management and Medication Current Pain Management: Electronic Signature(s) Signed: 08/14/2018 5:22:32 PM By: Harold Barban Entered By: Harold Barban on 08/14/2018 09:14:09 Ury, Halimah [name]. ([id number]) -------------------------------------------------------------------------------- Patient/Caregiver Education Details Patient Name: Theresa [name], Theresa [name]. Date of Service: 08/14/2018 9:15 AM Medical Record Number: [id number] Patient Account Number: [id number] Date of Birth/Gender: 12-26-1940 (78 y.o. F) Treating RN: Montey Hora Primary Care Physician: Johny Drilling Other Clinician: Referring Physician: Johny Drilling Treating Physician/Extender: Sharalyn Ink in Treatment: 5 Education Assessment Education Provided To: Patient and Caregiver Education Topics Provided Venous: Handouts: Other: please use lymph pumps BID and need for ongoing compression Methods: Explain/Verbal Responses: State content correctly Electronic Signature(s) Signed: 08/14/2018 5:37:40 PM By: Montey Hora Entered By: Montey Hora on 08/14/2018 09:44:47 Theresa [name], Theresa [name].  ([id number]) -------------------------------------------------------------------------------- Wound Assessment Details Patient Name: Theresa [name], Theresa [name]. Date of Service: 08/14/2018 9:15 AM Medical Record Number: [id number] Patient Account Number: [id number] Date of Birth/Sex: May 09, 1940 (78 y.o. F) Treating RN: Harold Barban Primary Care Janei Scheff: Johny Drilling Other Clinician: Referring Kamare Caspers: Johny Drilling Treating Yaviel Kloster/Extender: Melburn Hake, HOYT Weeks in Treatment: 5 Wound Status Wound Number: 1 Primary Diabetic Wound/Ulcer of the Lower Extremity Etiology: Wound Location: Left Malleolus - Medial Secondary Venous Leg Ulcer Wounding Event: Gradually Appeared Etiology: Date Acquired: 12/07/2017 Wound Open Weeks Of Treatment: 22 Status: Clustered Wound: No Comorbid Cataracts, Angina, Coronary Artery Disease, History: Hypertension, Myocardial Infarction, Peripheral Venous Disease, Type II Diabetes, Osteoarthritis, Neuropathy, Received Chemotherapy, Received Radiation Photos Wound Measurements Length: (cm) 2.5 % Reduction i Width: (cm) 3.5 % Reduction i Depth: (cm) 0.1 Tunneling: Area: (cm) 6.872 Undermining: Volume: (cm) 0.687 n Area: -994.3% n Volume: -990.5% No No Wound Description Classification: Grade 2 Foul Odor Aft Wound Margin: Flat and Intact Slough/Fibrin Exudate Amount: Large Exudate Type: Serous Exudate Color: amber er Cleansing: No o Yes Wound Bed Granulation Amount: Small (1-33%) Exposed Structure Granulation Quality: Pink Fascia Exposed: No Necrotic Amount: Medium (34-66%) Fat Layer (Subcutaneous Tissue) Exposed: Yes Necrotic Quality: Adherent Slough Tendon Exposed: No Muscle Exposed: No Joint Exposed: No Bau, Destiney [name]. ([id number]) Bone Exposed: No Treatment Notes Wound #1 (Left, Medial Malleolus) Notes silvercel, abd, 3 layer wrap with unna to anchor; finger - santyl and coverlet Electronic Signature(s) Signed: 08/14/2018  9:32:09 AM By: Army Melia Signed: 08/14/2018 5:22:32 PM By: Harold Barban Entered By: Army Melia on 08/14/2018 09:32:09 Eggert, Chizara [name]. ([id number]) -------------------------------------------------------------------------------- Wound Assessment Details Patient Name: Theresa [name], Theresa [name]. Date of Service: 08/14/2018 9:15 AM Medical Record Number: [id number] Patient Account Number: [id number] Date of Birth/Sex: September 04, 1940 (78 y.o. F) Treating RN: Harold Barban Primary Care Kaylianna Detert: Johny Drilling Other Clinician: Referring Bambie Pizzolato: Johny Drilling Treating Smaran Gaus/Extender: Melburn Hake, HOYT Weeks in Treatment: 5 Wound Status Wound Number: 3 Primary Diabetic Wound/Ulcer of the Lower Extremity Etiology: Wound Location: Left Lower Leg - Medial Wound Healed - Epithelialized Wounding Event: Gradually Appeared Status: Date Acquired: 01/29/2016 Comorbid Cataracts, Angina, Coronary Artery Disease, Weeks Of Treatment: 19 History: Hypertension, Myocardial Infarction, Peripheral Clustered Wound: No Venous Disease, Type II Diabetes, Osteoarthritis, Neuropathy, Received Chemotherapy, Received Radiation Wound Measurements Length: (cm) 0 % Width: (cm) 0 % Depth: (cm) 0 Ep Area: (cm) 0 T Volume: (cm) 0 U Reduction in Area: -2561.5% Reduction in Volume: -2596.8% ithelialization: None unneling: No ndermining: No Wound Description Classification: Grade 2 Wound Margin: Flat and Intact Exudate Amount: Large Exudate Type: Serous Exudate Color: amber Foul Odor After Cleansing:  No Slough/Fibrino Yes Wound Bed Granulation Amount: Small (1-33%) Exposed Structure Granulation Quality: Pale Fascia Exposed: No Necrotic Amount: Medium (34-66%) Fat Layer (Subcutaneous Tissue) Exposed: Yes Necrotic Quality: Adherent Slough Tendon Exposed: No Muscle Exposed: No Joint Exposed: No Bone Exposed: No Electronic Signature(s) Signed: 08/14/2018 5:22:32 PM By: Harold Barban Entered By:  Harold Barban on 08/14/2018 09:26:36 Reuss, Samai [name]. ([id number]) -------------------------------------------------------------------------------- Wound Assessment Details Patient Name: Truman, Khaila [name]. Date of Service: 08/14/2018 9:15 AM Medical Record Number: [id number] Patient Account Number: [id number] Date of Birth/Sex: Jul 09, 1940 (78 y.o. F) Treating RN: Harold Barban Primary Care Miro Balderson: Johny Drilling Other Clinician: Referring Lummie Montijo: Johny Drilling Treating Jenny Lai/Extender: Melburn Hake, HOYT Weeks in Treatment: 5 Wound Status Wound Number: 6 Primary Diabetic Wound/Ulcer of the Lower Extremity Etiology: Wound Location: Left Lower Leg - Posterior Wound Open Wounding Event: Gradually Appeared Status: Date Acquired: 06/26/2018 Comorbid Cataracts, Angina, Coronary Artery Disease, Weeks Of Treatment: 5 History: Hypertension, Myocardial Infarction, Peripheral Clustered Wound: No Venous Disease, Type II Diabetes, Osteoarthritis, Neuropathy, Received Chemotherapy, Received Radiation Photos Wound Measurements Length: (cm) 0.3 % Reduction in Width: (cm) 0.3 % Reduction in Depth: (cm) 0.1 Epithelializat Area: (cm) 0.071 Volume: (cm) 0.007 Area: 96.4% Volume: 96.4% ion: None Wound Description Classification: Grade 2 Foul Odor Afte Wound Margin: Flat and Intact Slough/Fibrino Exudate Amount: Medium Exudate Type: Serous Exudate Color: amber r Cleansing: No Yes Wound Bed Granulation Amount: Medium (34-66%) Exposed Structure Granulation Quality: Pale Fascia Exposed: No Necrotic Amount: Medium (34-66%) Fat Layer (Subcutaneous Tissue) Exposed: Yes Necrotic Quality: Eschar Tendon Exposed: No Muscle Exposed: No Joint Exposed: No Bone Exposed: No Mcmenamin, Chrisma [name]. ([id number]) Treatment Notes Wound #6 (Left, Posterior Lower Leg) Notes silvercel, abd, 3 layer wrap with unna to anchor; finger - santyl and coverlet Electronic Signature(s) Signed: 08/14/2018  9:33:25 AM By: Army Melia Signed: 08/14/2018 5:22:32 PM By: Harold Barban Entered By: Army Melia on 08/14/2018 09:33:24 Stitzer, Candice [name]. ([id number]) -------------------------------------------------------------------------------- Wound Assessment Details Patient Name: Costlow, Adiva [name]. Date of Service: 08/14/2018 9:15 AM Medical Record Number: [id number] Patient Account Number: [id number] Date of Birth/Sex: November 27, 1940 (78 y.o. F) Treating RN: Harold Barban Primary Care Elka Satterfield: Johny Drilling Other Clinician: Referring Khylen Riolo: Johny Drilling Treating Antwion Carpenter/Extender: Melburn Hake, HOYT Weeks in Treatment: 5 Wound Status Wound Number: 7 Primary Diabetic Wound/Ulcer of the Lower Extremity Etiology: Wound Location: Left Foot - Dorsal Wound Open Wounding Event: Gradually Appeared Status: Date Acquired: 07/29/2018 Comorbid Cataracts, Angina, Coronary Artery Disease, Weeks Of Treatment: 0 History: Hypertension, Myocardial Infarction, Peripheral Clustered Wound: No Venous Disease, Type II Diabetes, Osteoarthritis, Neuropathy, Received Chemotherapy, Received Radiation Photos Wound Measurements Length: (cm) 3.2 % Reduction i Width: (cm) 2 % Reduction i Depth: (cm) 0.1 Epithelializa Area: (cm) 5.027 Tunneling: Volume: (cm) 0.503 Undermining: n Area: 0% n Volume: 0% tion: Small (1-33%) No No Wound Description Classification: Grade 2 Foul Odor Aft Wound Margin: Flat and Intact Slough/Fibrin Exudate Amount: Large Exudate Type: Serous Exudate Color: amber er Cleansing: No o Yes Wound Bed Granulation Amount: Small (1-33%) Exposed Structure Granulation Quality: Pink Fascia Exposed: No Necrotic Amount: Medium (34-66%) Fat Layer (Subcutaneous Tissue) Exposed: Yes Necrotic Quality: Eschar, Adherent Slough Tendon Exposed: No Muscle Exposed: No Joint Exposed: No Bone Exposed: No Tupou, Deyna [name]. ([id number]) Treatment Notes Wound #7 (Left, Dorsal  Foot) Notes silvercel, abd, 3 layer wrap with unna to anchor; finger - santyl and coverlet Electronic Signature(s) Signed: 08/14/2018 9:33:51 AM By: Army Melia Signed: 08/14/2018 5:22:32 PM By: Harold Barban Entered By: Army Melia on 08/14/2018 09:33:51 Loe, Charlayne  [name]. ([id number]) -------------------------------------------------------------------------------- Wound Assessment Details Patient Name: Scheiderer, Amahia [name]. Date of Service: 08/14/2018 9:15 AM Medical Record Number: [id number] Patient Account Number: [id number] Date of Birth/Sex: [birth year] (78 y.o. F) Treating RN: Harold Barban Primary Care Bella Brummet: Johny Drilling Other Clinician: Referring Sully Dyment: Johny Drilling Treating Jarred Purtee/Extender: Melburn Hake, HOYT Weeks in Treatment: 5 Wound Status Wound Number: 8 Primary Diabetic Wound/Ulcer of the Lower Extremity Etiology: Wound Location: Right Lower Leg - Anterior Wound Open Wounding Event: Gradually Appeared Status: Date Acquired: 07/29/2018 Comorbid Cataracts, Angina, Coronary Artery Disease, Weeks Of Treatment: 0 History: Hypertension, Myocardial Infarction, Peripheral Clustered Wound: No Venous Disease, Type II Diabetes, Osteoarthritis, Neuropathy, Received Chemotherapy, Received Radiation Photos Wound Measurements Length: (cm) 1.2 % Reduction Width: (cm) 2 % Reduction Depth: (cm) 0.1 Area: (cm) 1.885 Volume: (cm) 0.188 in Area: 0% in Volume: 0% Wound Description Classification: Grade 2 Foul Odor Af Exudate Amount: Large Slough/Fibri Exudate Type: Serous Exudate Color: amber ter Cleansing: No no Yes Wound Bed Granulation Amount: Small (1-33%) Exposed Structure Granulation Quality: Pink Fascia Exposed: No Necrotic Amount: Medium (34-66%) Fat Layer (Subcutaneous Tissue) Exposed: Yes Necrotic Quality: Eschar, Adherent Slough Tendon Exposed: No Muscle Exposed: No Joint Exposed: No Bone Exposed: No Rayman, Lian [name]. ([id number]) Treatment  Notes Wound #8 (Right, Anterior Lower Leg) Notes silvercel, abd, 3 layer wrap with unna to anchor; finger - santyl and coverlet Electronic Signature(s) Signed: 08/14/2018 9:35:31 AM By: Army Melia Signed: 08/14/2018 5:22:32 PM By: Harold Barban Entered By: Army Melia on 08/14/2018 09:35:31 Baltes, Alishah [name]. ([id number]) -------------------------------------------------------------------------------- Wound Assessment Details Patient Name: Stepter, Raisha [name]. Date of Service: 08/14/2018 9:15 AM Medical Record Number: [id number] Patient Account Number: [id number] Date of Birth/Sex: 12-23-1940 (78 y.o. F) Treating RN: Harold Barban Primary Care Neida Ellegood: Johny Drilling Other Clinician: Referring Rolin Schult: Johny Drilling Treating Branden Vine/Extender: Melburn Hake, HOYT Weeks in Treatment: 5 Wound Status Wound Number: 9 Primary Trauma, Other Etiology: Wound Location: Right Hand - 2nd Digit Wound Open Wounding Event: Trauma Status: Date Acquired: 08/07/2018 Comorbid Cataracts, Angina, Coronary Artery Disease, Weeks Of Treatment: 0 History: Hypertension, Myocardial Infarction, Peripheral Clustered Wound: No Venous Disease, Type II Diabetes, Osteoarthritis, Neuropathy, Received Chemotherapy, Received Radiation Photos Wound Measurements Length: (cm) 1 Width: (cm) 1 Depth: (cm) 0.2 Area: (cm) 0.785 Volume: (cm) 0.157 % Reduction in Area: 0% % Reduction in Volume: 0% Epithelialization: None Tunneling: No Undermining: No Wound Description Full Thickness Without Exposed Support Foul Odo Classification: Structures Slough/F Wound Margin: Thickened Exudate Small Amount: Exudate Type: Serosanguineous Exudate Color: red, brown r After Cleansing: No ibrino Yes Wound Bed Granulation Amount: None Present (0%) Exposed Structure Necrotic Amount: Large (67-100%) Fascia Exposed: No Necrotic Quality: Eschar, Adherent Slough Fat Layer (Subcutaneous Tissue) Exposed: Yes Tendon  Exposed: No Muscle Exposed: No Joint Exposed: No Brockel, Shanessa [name]. ([id number]) Bone Exposed: No Treatment Notes Wound #9 (Right Hand - 2nd Digit) Notes silvercel, abd, 3 layer wrap with unna to anchor; finger - santyl and coverlet Electronic Signature(s) Signed: 08/14/2018 9:36:12 AM By: Army Melia Signed: 08/14/2018 5:22:32 PM By: Harold Barban Entered By: Army Melia on 08/14/2018 [facility] [place] [name]. ([id number]) -------------------------------------------------------------------------------- Vitals Details Patient Name: Krenz, Brilynn [name]. Date of Service: 08/14/2018 9:15 AM Medical Record Number: [id number] Patient Account Number: [id number] Date of Birth/Sex: [birth year] (78 y.o. F) Treating RN: Harold Barban Primary Care Travious Vanover: Johny Drilling Other Clinician: Referring Britt Petroni: Johny Drilling Treating Demarko Zeimet/Extender: Melburn Hake, HOYT Weeks in Treatment: 5 Vital Signs Time Taken: 09:15 Temperature (F): 97.8 Height (in): 67 Pulse (bpm): 74 Weight (lbs): 216  Respiratory Rate (breaths/min): 20 Body Mass Index (BMI): 33.8 Blood Pressure (mmHg): 145/60 Reference Range: 80 - 120 mg / dl Electronic Signature(s) Signed: 08/14/2018 5:22:32 PM By: Harold Barban Entered By: Harold Barban on 08/14/2018 09:18:51

## 2018-08-17 NOTE — Progress Notes (Addendum)
Theresa, [name] ([id number]) Visit Report for 08/14/2018 Chief Complaint Document Details Patient Name: Theresa [name], Theresa [name]. Date of Service: 08/14/2018 9:15 AM Medical Record Number: [id number] Patient Account Number: [id number] Date of Birth/Sex: November 12, 1940 (78 y.o. F) Treating RN: Montey Hora Primary Care Provider: Johny Drilling Other Clinician: Referring Provider: Johny Drilling Treating Provider/Extender: Melburn Hake, HOYT Weeks in Treatment: 5 Information Obtained from: Patient Chief Complaint Left LE ulcers and right hand ulcer Electronic Signature(s) Signed: 08/16/2018 6:25:26 PM By: Worthy Keeler PA-C Entered By: Worthy Keeler on 08/14/2018 09:38:34 Fildes, Jacquelina P. ([id number]) -------------------------------------------------------------------------------- Debridement Details Patient Name: [name], [name] Date of Service: 08/14/2018 9:15 AM Medical Record Number: [id number] Patient Account Number: [id number] Date of Birth/Sex: 23-Mar-1940 (78 y.o. F) Treating RN: Montey Hora Primary Care Provider: Johny Drilling Other Clinician: Referring Provider: Johny Drilling Treating Provider/Extender: Melburn Hake, HOYT Weeks in Treatment: 5 Debridement Performed for Wound #9 Right Hand - 2nd Digit Assessment: Performed By: Clinician Montey Hora, RN Debridement Type: Chemical/Enzymatic/Mechanical Agent Used: Santyl Level of Consciousness (Pre- Awake and Alert procedure): Pre-procedure Verification/Time Yes - 09:55 Out Taken: Start Time: 09:55 Pain Control: Lidocaine 4% Topical Solution Instrument: Other : tongue blade Bleeding: None End Time: 09:56 Procedural Pain: 0 Post Procedural Pain: 0 Response to Treatment: Procedure was tolerated well Level of Consciousness Awake and Alert (Post-procedure): Post Debridement Measurements of Total Wound Length: (cm) 1 Width: (cm) 1 Depth: (cm) 0.2 Volume: (cm) 0.157 Character of Wound/Ulcer Post Debridement:  Improved Post Procedure Diagnosis Same as Pre-procedure Electronic Signature(s) Signed: 08/14/2018 5:37:40 PM By: Montey Hora Signed: 08/16/2018 6:25:26 PM By: Worthy Keeler PA-C Entered By: Montey Hora on 08/14/2018 09:56:00 Rasmus, Kiva P. ([id number]) -------------------------------------------------------------------------------- HPI Details Patient Name: [name], [name] Date of Service: 08/14/2018 9:15 AM Medical Record Number: [id number] Patient Account Number: [id number] Date of Birth/Sex: August 02, 1940 (78 y.o. F) Treating RN: Montey Hora Primary Care Provider: Johny Drilling Other Clinician: Referring Provider: Johny Drilling Treating Provider/Extender: Melburn Hake, HOYT Weeks in Treatment: 5 History of Present Illness HPI Description: 03/09/18 on evaluation today patient presents for initial inspection in our clinic concerning issues that she has been having with bilateral lower extremity ulcers. She has lymphedema as well as chronic venous stasis. She has seen Dr. dew where she's undergone ABI testing with a left ABI 1.28 a right ABI 1.22. Subsequently she is supposed to be undergoing venous studies as well which are upcoming. She does have a history of diabetes, chronic venous stasis, peripheral vascular disease, and hypertension. Currently these wounds which are currently draining are stated to have been open for roughly 2 weeks or so. It does not appear to be any evidence of infection. No fevers, chills, nausea, or vomiting noted at this time. With that being said the patient does have some prominence to the ankle medially especially on the left side and I'm not sure if something was rubbing which calls this issue either. She is unable to get around and move very well simply due to the fact that she has an issue with her right hip as well which unfortunately prevents her from being able to move around very much. She did have an [place] placed at the request of her  daughter when she was at vascular this seems to may be potentially helped a little bit. 03/16/18 on evaluation today patient's right lower extremity appears to likely be completely healed although there still small area of concern which I'm gonna continue to watch. The really does not appear to  be any weeping at this point which is good news. Overall I feel like she is done excellent. In regard to left lower extremity this is a little larger still than the right although again the swelling is indeed down. The wound over the media malleolus does appear to be doing better which is good news. No fevers, chills, nausea, or vomiting noted at this time. Unfortunately the patient still continues to have right hip pain which is getting more severe. Again her surgeon did not want to undergo surgery until everything was healed as far as the wounds of the right lower extremity. 03/23/18 on evaluation today patient actually appears to be doing excellent in regard to her right lower extremity. There are no open wounds at this point in time and there is no weeping of drainage the compression wraps appear to have done very well for her. She did get the FarrowWrap 4000 Velcro compression wraps which we are gonna initiate treatment with today and this should help continue to keep swelling under control as well is the weeping. Again everything appears to be doing great in regard to the right lower extremity. In regard to left lower extremity everything is pretty much close there are no significant open wounds the only actual open wound that she has is on the medial left ankle which again is the opposite side from where her hip issue is and this is very superficial and shows no signs of infection at this point. 04/02/18 on evaluation today patient actually presents for an early appointment after I saw her last week due to several areas the left lower extremity which had reopened causing some issues at this point.  Fortunately there does not appear to be any evidence of infection at this time. With that being said she again is waiting on everything to be better in order to be able to proceed with the surgery for her right hip as previously suggested. 04/09/18 on evaluation today patient actually appears to be doing better in regard to her left lower surety ulcers which is great news. Unfortunately she continues to have issues even on the right side there are no open wounds that she does have a blister that arose on the right at this point as well. Fortunately there's no evidence of infection currently. No fevers, chills, nausea, or vomiting noted at this time. 04/16/18 on evaluation today patient actually appears to be doing very well in regard to her left lower extremity all the wounds appear to be completely healed including the left medial malleolus which was of greatest concern. The only if and when she has some regard to the right lower extremity where there is a very superficial area of blistering which is open at this point. Fortunately there's no signs of active infection otherwise. Overall I feel like she would be very safe to proceed with that surgery at this time. 04/21/18 on evaluation today patient presents for early follow-up she was supposed to be here in two days but nonetheless she is here sooner due to the fact that she's been having issues with her right wrap which is actually causing her a lot of discomfort. She states her leg has been more swollen and painful something she really has not experienced in recent weeks. Khaleelah, Yowell Malkie P. ([id number]) Again this leg has been doing very well there's just a small blister that we were trading on the lateral aspect of her right leg. Again the left leg has pretty much completely resolved. Fortunately upon  inspection today there does not appear to be any evidence of active infection systemically. With that being said I'm questioning whether or not she  may have some cellulitis around the right lower extremity especially with the wrap somewhat pushed down and I think she may be scratching at this area. Readmission: 07/10/18 patient presents today for reevaluation our clinic concerning issues that she has been having with her bilateral lower extremities with regard to open wounds/weeping. Right now she's having more than issue on the left posterior lower extremity although there is some weeping from the right posterior lower extremity as well. Fortunately there's no signs of active infection at this time which is good news. She really has not been using lymphedema pumps on a regular basis I do believe this would be of benefit for her. It also sounds like she has not been using the compression wraps that were Velcro that we got for her last time I think this likewise would also likely done better than just doing the ace wrap or whatnot when occasionally her legs were wrapped. Either way I think right now that if we initiate compression wrapping we can likely get things back under control for her. She needs to have this under control so she can proceed with surgery for her right hip. 07/17/18 on evaluation today patient appears to be doing excellent in regard to her bilateral lower extremity. She's been tolerating the dressing changes without complication. Fortunately there's no signs of active infection at this time. In fact the reps seem to have done your job and honestly I do not see anything open today at all which is excellent news. 08/14/18 upon evaluation today patient's wounds actually appear to be doing significantly worse compared to what I last saw when I evaluated her one month ago. In the interim since that time and now it does not appear that she been wearing her Velcro compression wraps as directed on a daily basis. She tells me that she maybe wears them every other day although she's using her compression pumps every day. Nonetheless she  has significant swelling as well as multiple areas of ulceration and skin opening which is not good. She's trying to keep her legs under control so that she can have her hip surgery. With that being said they're not gonna would likely want to do the surgery if she has open wounds which is why we're trying to get these healed. That's not gonna happen if she doesn't wear compression. This was discussed with her in great detail today. Electronic Signature(s) Signed: 08/16/2018 6:25:26 PM By: Worthy Keeler PA-C Entered By: Worthy Keeler on 08/16/2018 17:58:00 Scorsone, Deandrea PMarland Kitchen ([id number]) -------------------------------------------------------------------------------- Physical Exam Details Patient Name: Rainwater, Emmary P. Date of Service: 08/14/2018 9:15 AM Medical Record Number: [id number] Patient Account Number: [id number] Date of Birth/Sex: 07-10-1940 (78 y.o. F) Treating RN: Montey Hora Primary Care Provider: Johny Drilling Other Clinician: Referring Provider: Johny Drilling Treating Provider/Extender: Melburn Hake, HOYT Weeks in Treatment: 5 Constitutional Well-nourished and well-hydrated in no acute distress. Respiratory normal breathing without difficulty. clear to auscultation bilaterally. Cardiovascular regular rate and rhythm with normal S1, S2. 2+ dorsalis pedis/posterior tibialis pulses. 2+ pitting edema of the bilateral lower extremities. Psychiatric this patient is able to make decisions and demonstrates good insight into disease process. Alert and Oriented x 3. pleasant and cooperative. Notes Upon inspection today patient had multiple ulcerations that appear to be for the most part superficial noted of the bilateral lower extremities unfortunately. This  is secondary to her uncontrolled edema which again when she wears compression or we wrapper seems to do very well when she is not wrapped doesn't wear compression gets much worse. Overall I am definitely not too pleased  with the way her legs look at this time. Electronic Signature(s) Signed: 08/16/2018 6:25:26 PM By: Worthy Keeler PA-C Entered By: Worthy Keeler on 08/16/2018 17:58:59 Berwick, Fredna Dow ([id number]) -------------------------------------------------------------------------------- Physician Orders Details Patient Name: Jasso, Shelonda P. Date of Service: 08/14/2018 9:15 AM Medical Record Number: [id number] Patient Account Number: [id number] Date of Birth/Sex: [birth year] (78 y.o. F) Treating RN: Montey Hora Primary Care Provider: Johny Drilling Other Clinician: Referring Provider: Johny Drilling Treating Provider/Extender: Melburn Hake, HOYT Weeks in Treatment: 5 Verbal / Phone Orders: No Diagnosis Coding ICD-10 Coding Code Description E11.622 Type 2 diabetes mellitus with other skin ulcer I87.2 Venous insufficiency (chronic) (peripheral) L97.822 Non-pressure chronic ulcer of other part of left lower leg with fat layer exposed L97.522 Non-pressure chronic ulcer of other part of left foot with fat layer exposed S61.401A Unspecified open wound of right hand, initial encounter L98.492 Non-pressure chronic ulcer of skin 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 I10 Essential (primary) hypertension Wound Cleansing Wound #1 Left,Medial Malleolus o May shower with protection. - DO NOT GET YOUR WRAPS WET Wound #6 Left,Posterior Lower Leg o May shower with protection. - DO NOT GET YOUR WRAPS WET Wound #7 Left,Dorsal Foot o May shower with protection. - DO NOT GET YOUR WRAPS WET Wound #8 Right,Anterior Lower Leg o May shower with protection. - DO NOT GET YOUR WRAPS WET Wound #9 Right Hand - 2nd Digit o May shower with protection. - DO NOT GET YOUR WRAPS WET Primary Wound Dressing Wound #1 Left,Medial Malleolus o Silver Alginate Wound #6 Left,Posterior Lower Leg o Silver Alginate Wound #7 Left,Dorsal Foot o Silver Alginate Wound #8 Right,Anterior Lower Leg o Silver  Alginate Wound #9 Right Hand - 2nd Digit o Santyl Ointment Bisaillon, Larkyn P. ([id number]) Secondary Dressing Wound #1 Left,Medial Malleolus o ABD pad Wound #6 Left,Posterior Lower Leg o ABD pad Wound #7 Left,Dorsal Foot o ABD pad Wound #8 Right,Anterior Lower Leg o ABD pad Wound #9 Right Hand - 2nd Digit o Dry Gauze o Other - coverlet Dressing Change Frequency Wound #1 Left,Medial Malleolus o Change dressing every week Wound #6 Left,Posterior Lower Leg o Change dressing every week Wound #7 Left,Dorsal Foot o Change dressing every week Wound #8 Right,Anterior Lower Leg o Change dressing every week Wound #9 Right Hand - 2nd Digit o Change dressing every day. Follow-up Appointments o Return Appointment in 1 week. Edema Control Wound #1 Left,Medial Malleolus o 3 Layer Compression System - Bilateral o Elevate legs to the level of the heart and pump ankles as often as possible Wound #6 Left,Posterior Lower Leg o 3 Layer Compression System - Bilateral o Elevate legs to the level of the heart and pump ankles as often as possible Wound #7 Left,Dorsal Foot o 3 Layer Compression System - Bilateral o Elevate legs to the level of the heart and pump ankles as often as possible Wound #8 Right,Anterior Lower Leg o 3 Layer Compression System - Bilateral o Elevate legs to the level of the heart and pump ankles as often as possible Consults o Vascular Cavey, Odaly P. ([id number]) Patient Medications Allergies: codeine, morphine HCl Notifications Medication Indication Start End Keflex 08/14/2018 DOSE 1 - oral 500 mg capsule - 1 capsule oral taken every 6 hours for 10 days Santyl 08/14/2018 DOSE  topical 250 unit/gram ointment - ointment topical applied nickel thick to the wound bed and then cover with a dressing as directed Electronic Signature(s) Signed: 08/23/2018 11:42:18 PM By: Worthy Keeler PA-C Previous Signature: 08/14/2018 10:05:20  AM Version By: Worthy Keeler PA-C Entered By: Worthy Keeler on 08/21/2018 08:49:30 Dubeau, Chinenye P. ([id number]) -------------------------------------------------------------------------------- Problem List Details Patient Name: Salvucci, Anevay P. Date of Service: 08/14/2018 9:15 AM Medical Record Number: [id number] Patient Account Number: [id number] Date of Birth/Sex: 1940-10-31 (78 y.o. F) Treating RN: Montey Hora Primary Care Provider: Johny Drilling Other Clinician: Referring Provider: Johny Drilling Treating Provider/Extender: Melburn Hake, HOYT Weeks in Treatment: 5 Active Problems ICD-10 Evaluated Encounter Code Description Active Date Today Diagnosis E11.622 Type 2 diabetes mellitus with other skin ulcer 07/10/2018 No Yes I87.2 Venous insufficiency (chronic) (peripheral) 07/10/2018 No Yes L97.822 Non-pressure chronic ulcer of other part of left lower leg with 07/10/2018 No Yes fat layer exposed L97.522 Non-pressure chronic ulcer of other part of left foot with fat 08/14/2018 No Yes layer exposed S61.401A Unspecified open wound of right hand, initial encounter 08/14/2018 No Yes L98.492 Non-pressure chronic ulcer of skin of other sites with fat layer 08/14/2018 No Yes exposed I10 Essential (primary) hypertension 07/10/2018 No Yes Inactive Problems Resolved Problems Electronic Signature(s) Signed: 08/16/2018 6:25:26 PM By: Worthy Keeler PA-C Entered By: Worthy Keeler on 08/14/2018 09:38:08 Garin, Charnay P. ([id number]) -------------------------------------------------------------------------------- Progress Note Details Patient Name: Amspacher, Caasi P. Date of Service: 08/14/2018 9:15 AM Medical Record Number: [id number] Patient Account Number: [id number] Date of Birth/Sex: 1940-05-14 (78 y.o. F) Treating RN: Montey Hora Primary Care Provider: Johny Drilling Other Clinician: Referring Provider: Johny Drilling Treating Provider/Extender: Melburn Hake, HOYT Weeks in Treatment:  5 Subjective Chief Complaint Information obtained from Patient Left LE ulcers and right hand ulcer History of Present Illness (HPI) 03/09/18 on evaluation today patient presents for initial inspection in our clinic concerning issues that she has been having with bilateral lower extremity ulcers. She has lymphedema as well as chronic venous stasis. She has seen Dr. dew where she's undergone ABI testing with a left ABI 1.28 a right ABI 1.22. Subsequently she is supposed to be undergoing venous studies as well which are upcoming. She does have a history of diabetes, chronic venous stasis, peripheral vascular disease, and hypertension. Currently these wounds which are currently draining are stated to have been open for roughly 2 weeks or so. It does not appear to be any evidence of infection. No fevers, chills, nausea, or vomiting noted at this time. With that being said the patient does have some prominence to the ankle medially especially on the left side and I'm not sure if something was rubbing which calls this issue either. She is unable to get around and move very well simply due to the fact that she has an issue with her right hip as well which unfortunately prevents her from being able to move around very much. She did have an [place] placed at the request of her daughter when she was at vascular this seems to may be potentially helped a little bit. 03/16/18 on evaluation today patient's right lower extremity appears to likely be completely healed although there still small area of concern which I'm gonna continue to watch. The really does not appear to be any weeping at this point which is good news. Overall I feel like she is done excellent. In regard to left lower extremity this is a little larger still than the right although again the  swelling is indeed down. The wound over the media malleolus does appear to be doing better which is good news. No fevers, chills, nausea, or vomiting noted  at this time. Unfortunately the patient still continues to have right hip pain which is getting more severe. Again her surgeon did not want to undergo surgery until everything was healed as far as the wounds of the right lower extremity. 03/23/18 on evaluation today patient actually appears to be doing excellent in regard to her right lower extremity. There are no open wounds at this point in time and there is no weeping of drainage the compression wraps appear to have done very well for her. She did get the FarrowWrap 4000 Velcro compression wraps which we are gonna initiate treatment with today and this should help continue to keep swelling under control as well is the weeping. Again everything appears to be doing great in regard to the right lower extremity. In regard to left lower extremity everything is pretty much close there are no significant open wounds the only actual open wound that she has is on the medial left ankle which again is the opposite side from where her hip issue is and this is very superficial and shows no signs of infection at this point. 04/02/18 on evaluation today patient actually presents for an early appointment after I saw her last week due to several areas the left lower extremity which had reopened causing some issues at this point. Fortunately there does not appear to be any evidence of infection at this time. With that being said she again is waiting on everything to be better in order to be able to proceed with the surgery for her right hip as previously suggested. 04/09/18 on evaluation today patient actually appears to be doing better in regard to her left lower surety ulcers which is great news. Unfortunately she continues to have issues even on the right side there are no open wounds that she does have a blister that arose on the right at this point as well. Fortunately there's no evidence of infection currently. No fevers, chills, nausea, or vomiting noted at  this time. 04/16/18 on evaluation today patient actually appears to be doing very well in regard to her left lower extremity all the wounds appear to be completely healed including the left medial malleolus which was of greatest concern. The only if and when she has some regard to the right lower extremity where there is a very superficial area of blistering which is open at this point. Fortunately there's no signs of active infection otherwise. Overall I feel like she would be very safe to proceed with that Archibeque, Betania P. ([id number]) surgery at this time. 04/21/18 on evaluation today patient presents for early follow-up she was supposed to be here in two days but nonetheless she is here sooner due to the fact that she's been having issues with her right wrap which is actually causing her a lot of discomfort. She states her leg has been more swollen and painful something she really has not experienced in recent weeks. Again this leg has been doing very well there's just a small blister that we were trading on the lateral aspect of her right leg. Again the left leg has pretty much completely resolved. Fortunately upon inspection today there does not appear to be any evidence of active infection systemically. With that being said I'm questioning whether or not she may have some cellulitis around the right lower extremity especially with  the wrap somewhat pushed down and I think she may be scratching at this area. Readmission: 07/10/18 patient presents today for reevaluation our clinic concerning issues that she has been having with her bilateral lower extremities with regard to open wounds/weeping. Right now she's having more than issue on the left posterior lower extremity although there is some weeping from the right posterior lower extremity as well. Fortunately there's no signs of active infection at this time which is good news. She really has not been using lymphedema pumps on a regular basis  I do believe this would be of benefit for her. It also sounds like she has not been using the compression wraps that were Velcro that we got for her last time I think this likewise would also likely done better than just doing the ace wrap or whatnot when occasionally her legs were wrapped. Either way I think right now that if we initiate compression wrapping we can likely get things back under control for her. She needs to have this under control so she can proceed with surgery for her right hip. 07/17/18 on evaluation today patient appears to be doing excellent in regard to her bilateral lower extremity. She's been tolerating the dressing changes without complication. Fortunately there's no signs of active infection at this time. In fact the reps seem to have done your job and honestly I do not see anything open today at all which is excellent news. 08/14/18 upon evaluation today patient's wounds actually appear to be doing significantly worse compared to what I last saw when I evaluated her one month ago. In the interim since that time and now it does not appear that she been wearing her Velcro compression wraps as directed on a daily basis. She tells me that she maybe wears them every other day although she's using her compression pumps every day. Nonetheless she has significant swelling as well as multiple areas of ulceration and skin opening which is not good. She's trying to keep her legs under control so that she can have her hip surgery. With that being said they're not gonna would likely want to do the surgery if she has open wounds which is why we're trying to get these healed. That's not gonna happen if she doesn't wear compression. This was discussed with her in great detail today. Patient History Information obtained from Patient. Social History Never smoker, Marital Status - Married, Alcohol Use - Never, Drug Use - No History, Caffeine Use - Never. Medical History Eyes Patient has  history of Cataracts - removed Denies history of Glaucoma, Optic Neuritis Ear/Nose/Mouth/Throat Denies history of Chronic sinus problems/congestion, Middle ear problems Hematologic/Lymphatic Denies history of Anemia, Hemophilia, Human Immunodeficiency Virus, Lymphedema, Sickle Cell Disease Respiratory Denies history of Aspiration, Asthma, Chronic Obstructive Pulmonary Disease (COPD), Pneumothorax, Sleep Apnea, Tuberculosis Cardiovascular Patient has history of Angina, Coronary Artery Disease, Hypertension, Myocardial Infarction - more than 5 years ago, Peripheral Venous Disease Denies history of Arrhythmia, Congestive Heart Failure, Deep Vein Thrombosis, Hypotension, Peripheral Arterial Disease, Phlebitis, Vasculitis Gastrointestinal Denies history of Cirrhosis , Colitis, Crohn s, Hepatitis A, Hepatitis B, Hepatitis C Peppel, Shanetra P. ([id number]) Endocrine Patient has history of Type II Diabetes Denies history of Type I Diabetes Genitourinary Denies history of End Stage Renal Disease Immunological Denies history of Lupus Erythematosus, Raynaud s, Scleroderma Integumentary (Skin) Denies history of History of Burn, History of pressure wounds Musculoskeletal Patient has history of Osteoarthritis Denies history of Gout, Rheumatoid Arthritis, Osteomyelitis Neurologic Patient has history of Neuropathy  Denies history of Dementia, Quadriplegia, Paraplegia, Seizure Disorder Oncologic Patient has history of Received Chemotherapy, Received Radiation Psychiatric Denies history of Anorexia/bulimia, Confinement Anxiety Medical And Surgical History Notes Genitourinary CKD stage 3 Oncologic 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 Respiratory Denies complaints or symptoms of Chronic or frequent coughs, Shortness of  Breath. Cardiovascular Complains or has symptoms of LE edema. Denies complaints or symptoms of Chest pain. Psychiatric Denies complaints or symptoms of Anxiety, Claustrophobia. Objective Constitutional Well-nourished and well-hydrated in no acute distress. Vitals Time Taken: 9:15 AM, Height: 67 in, Weight: 216 lbs, BMI: 33.8, Temperature: 97.8 F, Pulse: 74 bpm, Respiratory Rate: 20 breaths/min, Blood Pressure: 145/60 mmHg. Respiratory normal breathing without difficulty. clear to auscultation bilaterally. Cardiovascular regular rate and rhythm with normal S1, S2. 2+ dorsalis pedis/posterior tibialis pulses. 2+ pitting edema of the bilateral lower extremities. TRAVIS, PURK ([id number]) Psychiatric this patient is able to make decisions and demonstrates good insight into disease process. Alert and Oriented x 3. pleasant and cooperative. General Notes: Upon inspection today patient had multiple ulcerations that appear to be for the most part superficial noted of the bilateral lower extremities unfortunately. This is secondary to her uncontrolled edema which again when she wears compression or we wrapper seems to do very well when she is not wrapped doesn't wear compression gets much worse. Overall I am definitely not too pleased with the way her legs look at this time. Integumentary (Hair, Skin) Wound #1 status is Open. Original cause of wound was Gradually Appeared. The wound is located on the Left,Medial Malleolus. The wound measures 2.5cm length x 3.5cm width x 0.1cm depth; 6.872cm^2 area and 0.687cm^3 volume. There is Fat Layer (Subcutaneous Tissue) Exposed exposed. There is no tunneling or undermining noted. There is a large amount of serous drainage noted. The wound margin is flat and intact. There is small (1-33%) pink granulation within the wound bed. There is a medium (34-66%) amount of necrotic tissue within the wound bed including Adherent Slough. Wound #3 status is  Healed - Epithelialized. Original cause of wound was Gradually Appeared. The wound is located on the Left,Medial Lower Leg. The wound measures 0cm length x 0cm width x 0cm depth; 0cm^2 area and 0cm^3 volume. There is Fat Layer (Subcutaneous Tissue) Exposed exposed. There is no tunneling or undermining noted. There is a large amount of serous drainage noted. The wound margin is flat and intact. There is small (1-33%) pale granulation within the wound bed. There is a medium (34-66%) amount of necrotic tissue within the wound bed including Adherent Slough. Wound #6 status is Open. Original cause of wound was Gradually Appeared. The wound is located on the Left,Posterior Lower Leg. The wound measures 0.3cm length x 0.3cm width x 0.1cm depth; 0.071cm^2 area and 0.007cm^3 volume. There is Fat Layer (Subcutaneous Tissue) Exposed exposed. There is a medium amount of serous drainage noted. The wound margin is flat and intact. There is medium (34-66%) pale granulation within the wound bed. There is a medium (34-66%) amount of necrotic tissue within the wound bed including Eschar. Wound #7 status is Open. Original cause of wound was Gradually Appeared. The wound is located on the Left,Dorsal Foot. The wound measures 3.2cm length x 2cm width x 0.1cm depth; 5.027cm^2 area and 0.503cm^3 volume. There is Fat Layer (Subcutaneous Tissue) Exposed exposed. There is no tunneling or undermining noted. There is a  large amount of serous drainage noted. The wound margin is flat and intact. There is small (1-33%) pink granulation within the wound bed. There is a medium (34-66%) amount of necrotic tissue within the wound bed including Eschar and Adherent Slough. Wound #8 status is Open. Original cause of wound was Gradually Appeared. The wound is located on the Right,Anterior Lower Leg. The wound measures 1.2cm length x 2cm width x 0.1cm depth; 1.885cm^2 area and 0.188cm^3 volume. There is Fat Layer (Subcutaneous Tissue)  Exposed exposed. There is a large amount of serous drainage noted. There is small (1-33%) pink granulation within the wound bed. There is a medium (34-66%) amount of necrotic tissue within the wound bed including Eschar and Adherent Slough. Wound #9 status is Open. Original cause of wound was Trauma. The wound is located on the Right Hand - 2nd Digit. The wound measures 1cm length x 1cm width x 0.2cm depth; 0.785cm^2 area and 0.157cm^3 volume. There is Fat Layer (Subcutaneous Tissue) Exposed exposed. There is no tunneling or undermining noted. There is a small amount of serosanguineous drainage noted. The wound margin is thickened. There is no granulation within the wound bed. There is a large (67-100%) amount of necrotic tissue within the wound bed including Eschar and Adherent Slough. 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 ([id number]) Non-pressure chronic ulcer of other part of left foot with fat layer exposed Unspecified open wound of right hand, initial encounter Non-pressure chronic ulcer of skin 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 Essential (primary) hypertension Procedures Wound #9 Pre-procedure diagnosis of Wound #9 is a Trauma, Other located on the Right Hand - 2nd Digit . There was a Chemical/Enzymatic/Mechanical debridement performed by Montey Hora, RN. With the following instrument(s): tongue blade after achieving pain control using Lidocaine 4% Topical Solution. Agent used was [employer]. A time out was conducted at 09:55, prior to the start of the procedure. There was no bleeding. The procedure was tolerated well with a pain level of 0 throughout and a pain level of 0 following the procedure. Post Debridement Measurements: 1cm length x 1cm width x 0.2cm depth; 0.157cm^3 volume. Character of  Wound/Ulcer Post Debridement is improved. Post procedure Diagnosis Wound #9: Same as Pre-Procedure Wound #1 Pre-procedure diagnosis of Wound #1 is a Diabetic Wound/Ulcer of the Lower Extremity located on the Left,Medial Malleolus . There was a Three Layer Compression Therapy Procedure with a pre-treatment ABI of 1.2 by Montey Hora, RN. Post procedure Diagnosis Wound #1: Same as Pre-Procedure Wound #6 Pre-procedure diagnosis of Wound #6 is a Diabetic Wound/Ulcer of the Lower Extremity located on the Left,Posterior Lower Leg . There was a Three Layer Compression Therapy Procedure with a pre-treatment ABI of 1.2 by Montey Hora, RN. Post procedure Diagnosis Wound #6: Same as Pre-Procedure Wound #7 Pre-procedure diagnosis of Wound #7 is a Diabetic Wound/Ulcer of the Lower Extremity located on the Left,Dorsal Foot . There was a Three Layer Compression Therapy Procedure with a pre-treatment ABI of 1.2 by Montey Hora, RN. Post procedure Diagnosis Wound #7: Same as Pre-Procedure Wound #8 Pre-procedure diagnosis of Wound #8 is a Diabetic Wound/Ulcer of the Lower Extremity located on the Right,Anterior Lower Leg . There was a Three Layer Compression Therapy Procedure with a pre-treatment ABI of 1.2 by Montey Hora, RN. Post procedure Diagnosis Wound #8: Same as Pre-Procedure Plan Wound Cleansing: Wound #1  Left,Medial Malleolus: May shower with protection. - DO NOT GET YOUR WRAPS WET Wound #6 Left,Posterior Lower Leg: May shower with protection. - DO NOT GET YOUR WRAPS WET Szatkowski, Sam P. ([id number]) Wound #7 Left,Dorsal Foot: May shower with protection. - DO NOT GET YOUR WRAPS WET Wound #8 Right,Anterior Lower Leg: May shower with protection. - DO NOT GET YOUR WRAPS WET Wound #9 Right Hand - 2nd Digit: May shower with protection. - DO NOT GET YOUR WRAPS WET Primary Wound Dressing: Wound #1 Left,Medial Malleolus: Silver Alginate Wound #6 Left,Posterior Lower Leg: Silver  Alginate Wound #7 Left,Dorsal Foot: Silver Alginate Wound #8 Right,Anterior Lower Leg: Silver Alginate Wound #9 Right Hand - 2nd Digit: Santyl Ointment Secondary Dressing: Wound #1 Left,Medial Malleolus: ABD pad Wound #6 Left,Posterior Lower Leg: ABD pad Wound #7 Left,Dorsal Foot: ABD pad Wound #8 Right,Anterior Lower Leg: ABD pad Wound #9 Right Hand - 2nd Digit: Dry Gauze Other - coverlet Dressing Change Frequency: Wound #1 Left,Medial Malleolus: Change dressing every week Wound #6 Left,Posterior Lower Leg: Change dressing every week Wound #7 Left,Dorsal Foot: Change dressing every week Wound #8 Right,Anterior Lower Leg: Change dressing every week Wound #9 Right Hand - 2nd Digit: Change dressing every day. Follow-up Appointments: Return Appointment in 1 week. Edema Control: Wound #1 Left,Medial Malleolus: 3 Layer Compression System - Bilateral Elevate legs to the level of the heart and pump ankles as often as possible Wound #6 Left,Posterior Lower Leg: 3 Layer Compression System - Bilateral Elevate legs to the level of the heart and pump ankles as often as possible Wound #7 Left,Dorsal Foot: 3 Layer Compression System - Bilateral Elevate legs to the level of the heart and pump ankles as often as possible Wound #8 Right,Anterior Lower Leg: 3 Layer Compression System - Bilateral Elevate legs to the level of the heart and pump ankles as often as possible Consults ordered were: Vascular The following medication(s) was prescribed: Keflex oral 500 mg capsule 1 1 capsule oral taken every 6 hours for 10 days starting 08/14/2018 JANDA, CARGO P. ([id number]) Santyl topical 250 unit/gram ointment ointment topical applied nickel thick to the wound bed and then cover with a dressing as directed starting 08/14/2018 My suggestion at this point is gonna be that we go ahead and initiate the above wound care measures including the compression wrap that we will have her keep in  place until we see you next week. I think this is the best way to go as she seems to do well with the wraps and again when she has good compression the wounds heal quite quickly generally. That's what we're after so this will hopefully be able to have a surgery in four weeks. We will subtly see her back for reevaluation in one weeks time. Please see above for specific wound care orders. We will see patient for re-evaluation in 1 week(s) here in the clinic. If anything worsens or changes patient will contact our office for additional recommendations. Electronic Signature(s) Signed: 08/23/2018 11:42:18 PM By: Worthy Keeler PA-C Previous Signature: 08/16/2018 6:25:26 PM Version By: Worthy Keeler PA-C Entered By: Worthy Keeler on 08/21/2018 08:49:42 Failla, Nikka Mamie Nick ([id number]) -------------------------------------------------------------------------------- ROS/PFSH Details Patient Name: Croucher, Karliah P. Date of Service: 08/14/2018 9:15 AM Medical Record Number: [id number] Patient Account Number: [id number] Date of Birth/Sex: 04-Sep-1940 (78 y.o. F) Treating RN: Montey Hora Primary Care Provider: Johny Drilling Other Clinician: Referring Provider: Johny Drilling Treating Provider/Extender: Melburn Hake, HOYT Weeks in Treatment: 5 Information Obtained From Patient Constitutional Symptoms (  General Health) Complaints and Symptoms: Negative for: Fatigue; Fever; Chills; Marked Weight Change Respiratory Complaints and Symptoms: Negative for: Chronic or frequent coughs; Shortness of Breath Medical History: Negative for: Aspiration; Asthma; Chronic Obstructive Pulmonary Disease (COPD); Pneumothorax; Sleep Apnea; Tuberculosis Cardiovascular Complaints and Symptoms: Positive for: LE edema Negative for: Chest pain Medical History: Positive for: Angina; Coronary Artery Disease; Hypertension; Myocardial Infarction - more than 5 years ago; Peripheral Venous Disease Negative for: Arrhythmia;  Congestive Heart Failure; Deep Vein Thrombosis; Hypotension; Peripheral Arterial Disease; Phlebitis; Vasculitis Psychiatric Complaints and Symptoms: Negative for: Anxiety; Claustrophobia Medical History: Negative for: Anorexia/bulimia; Confinement Anxiety Eyes Medical History: Positive for: Cataracts - removed Negative for: Glaucoma; Optic Neuritis Ear/Nose/Mouth/Throat Medical History: Negative for: Chronic sinus problems/congestion; Middle ear problems Hematologic/Lymphatic JAHNA, LIEBERT. ([id number]) Medical History: Negative for: Anemia; Hemophilia; Human Immunodeficiency Virus; Lymphedema; Sickle Cell Disease Gastrointestinal Medical History: Negative for: Cirrhosis ; Colitis; Crohnos; Hepatitis A; Hepatitis B; Hepatitis C Endocrine Medical History: Positive for: Type II Diabetes Negative for: Type I Diabetes Treated with: Insulin Blood sugar tested every day: Yes Tested : Blood sugar testing results: Breakfast: 211; Lunch: 240 Genitourinary Medical History: Negative for: End Stage Renal Disease Past Medical History Notes: CKD stage 3 Immunological Medical History: Negative for: Lupus Erythematosus; Raynaudos; Scleroderma Integumentary (Skin) Medical History: Negative for: History of Burn; History of pressure wounds Musculoskeletal Medical History: Positive for: Osteoarthritis Negative for: Gout; Rheumatoid Arthritis; Osteomyelitis Neurologic Medical History: Positive for: Neuropathy Negative for: Dementia; Quadriplegia; Paraplegia; Seizure Disorder Oncologic Medical History: Positive for: Received Chemotherapy; Received Radiation Past Medical History Notes: 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 ([id number]) Immunizations Pneumococcal Vaccine: Received Pneumococcal Vaccination: Yes Implantable Devices No devices added Family and Social History Never smoker;  Marital Status - Married; Alcohol Use: Never; Drug Use: No History; Caffeine Use: Never; Financial Concerns: No; Food, Clothing or Shelter Needs: No; Support System Lacking: No; Transportation Concerns: No Physician Affirmation I have reviewed and agree with the above information. Electronic Signature(s) Signed: 08/16/2018 6:25:26 PM By: Worthy Keeler PA-C Signed: 08/17/2018 4:17:58 PM By: Montey Hora Entered By: Worthy Keeler on 08/16/2018 17:58:25 Mix, Shavell P. ([id number]) -------------------------------------------------------------------------------- SuperBill Details Patient Name: Weyman, Jonnie P. Date of Service: 08/14/2018 Medical Record Number: [id number] Patient Account Number: [id number] Date of Birth/Sex: Nov 12, 1940 (78 y.o. F) Treating RN: Montey Hora Primary Care Provider: Johny Drilling Other Clinician: Referring Provider: Johny Drilling Treating Provider/Extender: Melburn Hake, HOYT Weeks in Treatment: 5 Diagnosis Coding ICD-10 Codes Code Description E11.622 Type 2 diabetes mellitus with other skin ulcer I87.2 Venous insufficiency (chronic) (peripheral) L97.822 Non-pressure chronic ulcer of other part of left lower leg with fat layer exposed L97.522 Non-pressure chronic ulcer of other part of left foot with fat layer exposed S61.401A Unspecified open wound of right hand, initial encounter L98.492 Non-pressure chronic ulcer of skin 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 [place] (primary) hypertension Facility Procedures CPT4 Code: 12751700 Description: 17494 - DEBRIDE W/O ANES NON SELECT Modifier: Quantity: 1 Physician Procedures CPT4 Code Description: 4967591 63846 - WC PHYS LEVEL 4 - EST PT ICD-10 Diagnosis Description E11.622 Type 2 diabetes mellitus with other skin ulcer I87.2 Venous insufficiency (chronic) (peripheral) L97.822 Non-pressure chronic ulcer of other part of left  lower leg with L97.522 Non-pressure chronic ulcer of other part of left  foot with fat Modifier: fat layer expos layer exposed Quantity: 1 ed Electronic Signature(s) Signed: 08/16/2018 6:25:26 PM By: Worthy Keeler PA-C Entered  By: Worthy Keeler on 08/16/2018 18:02:04

## 2018-08-21 ENCOUNTER — Other Ambulatory Visit: Payer: Self-pay

## 2018-08-21 ENCOUNTER — Encounter: Payer: Medicare HMO | Admitting: Physician Assistant

## 2018-08-21 DIAGNOSIS — E11622 Type 2 diabetes mellitus with other skin ulcer: Secondary | ICD-10-CM | POA: Diagnosis not present

## 2018-08-21 NOTE — Progress Notes (Signed)
[name], [name] ([id number]) Visit Report for 08/21/2018 Chief Complaint Document Details Patient Name: Theresa [name], Theresa [name]. Date of Service: 08/21/2018 8:00 AM Medical Record Number: [id number] Patient Account Number: [id number] Date of Birth/Sex: 02/15/1940 (78 y.o. F) Treating RN: Montey Hora Primary Care Provider: Johny Drilling Other Clinician: Referring Provider: Johny Drilling Treating Provider/Extender: Melburn Hake, HOYT Weeks in Treatment: 6 Information Obtained from: Patient Chief Complaint Left LE ulcers and right hand ulcer Electronic Signature(s) Signed: 08/21/2018 4:51:26 PM By: Worthy Keeler PA-C Entered By: Worthy Keeler on 08/21/2018 08:15:53 [name], [name] ([id number]) -------------------------------------------------------------------------------- Debridement Details Patient Name: Theresa [name], [name] Date of Service: 08/21/2018 8:00 AM Medical Record Number: [id number] Patient Account Number: [id number] Date of Birth/Sex: [birth year] (78 y.o. F) Treating RN: Montey Hora Primary Care Provider: Johny Drilling Other Clinician: Referring Provider: Johny Drilling Treating Provider/Extender: Melburn Hake, HOYT Weeks in Treatment: 6 Debridement Performed for Wound #9 Right Hand - 2nd Digit Assessment: Performed By: Clinician Montey Hora, RN Debridement Type: Chemical/Enzymatic/Mechanical Agent Used: Santyl Level of Consciousness (Pre- Awake and Alert procedure): Pre-procedure Verification/Time Yes - 08:45 Out Taken: Start Time: 08:45 Pain Control: Lidocaine 4% Topical Solution Instrument: Other : tongue blade Bleeding: None End Time: 08:46 Procedural Pain: 0 Post Procedural Pain: 0 Response to Treatment: Procedure was tolerated well Level of Consciousness Awake and Alert (Post-procedure): Post Debridement Measurements of Total Wound Length: (cm) 2.7 Width: (cm) 1 Depth: (cm) 0.1 Volume: (cm) 0.212 Character of Wound/Ulcer Post Debridement:  Improved Post Procedure Diagnosis Same as Pre-procedure Electronic Signature(s) Signed: 08/21/2018 4:09:36 PM By: Montey Hora Signed: 08/21/2018 4:51:26 PM By: Worthy Keeler PA-C Entered By: Montey Hora on 08/21/2018 08:46:21 Theresa [name], [name] ([id number]) -------------------------------------------------------------------------------- HPI Details Patient Name: Theresa [name], [name] Date of Service: 08/21/2018 8:00 AM Medical Record Number: [id number] Patient Account Number: [id number] Date of Birth/Sex: [birth year] (78 y.o. F) Treating RN: Montey Hora Primary Care Provider: Johny Drilling Other Clinician: Referring Provider: Johny Drilling Treating Provider/Extender: Melburn Hake, HOYT Weeks in Treatment: 6 History of Present Illness HPI Description: 03/09/18 on evaluation today patient presents for initial inspection in our clinic concerning issues that she has been having with bilateral lower extremity ulcers. She has lymphedema as well as chronic venous stasis. She has seen Dr. dew where she's undergone ABI testing with a left ABI 1.28 a right ABI 1.22. Subsequently she is supposed to be undergoing venous studies as well which are upcoming. She does have a history of diabetes, chronic venous stasis, peripheral vascular disease, and hypertension. Currently these wounds which are currently draining are stated to have been open for roughly 2 weeks or so. It does not appear to be any evidence of infection. No fevers, chills, nausea, or vomiting noted at this time. With that being said the patient does have some prominence to the ankle medially especially on the left side and I'm not sure if something was rubbing which calls this issue either. She is unable to get around and move very well simply due to the fact that she has an issue with her right hip as well which unfortunately prevents her from being able to move around very much. She did have an [place] placed at the request of her  daughter when she was at vascular this seems to may be potentially helped a little bit. 03/16/18 on evaluation today patient's right lower extremity appears to likely be completely healed although there still small area of concern which I'm gonna continue to watch. The really does not appear to  be any weeping at this point which is good news. Overall I feel like she is done excellent. In regard to left lower extremity this is a little larger still than the right although again the swelling is indeed down. The wound over the media malleolus does appear to be doing better which is good news. No fevers, chills, nausea, or vomiting noted at this time. Unfortunately the patient still continues to have right hip pain which is getting more severe. Again her surgeon did not want to undergo surgery until everything was healed as far as the wounds of the right lower extremity. 03/23/18 on evaluation today patient actually appears to be doing excellent in regard to her right lower extremity. There are no open wounds at this point in time and there is no weeping of drainage the compression wraps appear to have done very well for her. She did get the FarrowWrap 4000 Velcro compression wraps which we are gonna initiate treatment with today and this should help continue to keep swelling under control as well is the weeping. Again everything appears to be doing great in regard to the right lower extremity. In regard to left lower extremity everything is pretty much close there are no significant open wounds the only actual open wound that she has is on the medial left ankle which again is the opposite side from where her hip issue is and this is very superficial and shows no signs of infection at this point. 04/02/18 on evaluation today patient actually presents for an early appointment after I saw her last week due to several areas the left lower extremity which had reopened causing some issues at this point.  Fortunately there does not appear to be any evidence of infection at this time. With that being said she again is waiting on everything to be better in order to be able to proceed with the surgery for her right hip as previously suggested. 04/09/18 on evaluation today patient actually appears to be doing better in regard to her left lower surety ulcers which is great news. Unfortunately she continues to have issues even on the right side there are no open wounds that she does have a blister that arose on the right at this point as well. Fortunately there's no evidence of infection currently. No fevers, chills, nausea, or vomiting noted at this time. 04/16/18 on evaluation today patient actually appears to be doing very well in regard to her left lower extremity all the wounds appear to be completely healed including the left medial malleolus which was of greatest concern. The only if and when she has some regard to the right lower extremity where there is a very superficial area of blistering which is open at this point. Fortunately there's no signs of active infection otherwise. Overall I feel like she would be very safe to proceed with that surgery at this time. 04/21/18 on evaluation today patient presents for early follow-up she was supposed to be here in two days but nonetheless she is here sooner due to the fact that she's been having issues with her right wrap which is actually causing her a lot of discomfort. She states her leg has been more swollen and painful something she really has not experienced in recent weeks. Theresa [name], Theresa Karle P. ([id number]) Again this leg has been doing very well there's just a small blister that we were trading on the lateral aspect of her right leg. Again the left leg has pretty much completely resolved. Fortunately upon  inspection today there does not appear to be any evidence of active infection systemically. With that being said I'm questioning whether or not she  may have some cellulitis around the right lower extremity especially with the wrap somewhat pushed down and I think she may be scratching at this area. Readmission: 07/10/18 patient presents today for reevaluation our clinic concerning issues that she has been having with her bilateral lower extremities with regard to open wounds/weeping. Right now she's having more than issue on the left posterior lower extremity although there is some weeping from the right posterior lower extremity as well. Fortunately there's no signs of active infection at this time which is good news. She really has not been using lymphedema pumps on a regular basis I do believe this would be of benefit for her. It also sounds like she has not been using the compression wraps that were Velcro that we got for her last time I think this likewise would also likely done better than just doing the ace wrap or whatnot when occasionally her legs were wrapped. Either way I think right now that if we initiate compression wrapping we can likely get things back under control for her. She needs to have this under control so she can proceed with surgery for her right hip. 07/17/18 on evaluation today patient appears to be doing excellent in regard to her bilateral lower extremity. She's been tolerating the dressing changes without complication. Fortunately there's no signs of active infection at this time. In fact the reps seem to have done your job and honestly I do not see anything open today at all which is excellent news. 08/14/18 upon evaluation today patient's wounds actually appear to be doing significantly worse compared to what I last saw when I evaluated her one month ago. In the interim since that time and now it does not appear that she been wearing her Velcro compression wraps as directed on a daily basis. She tells me that she maybe wears them every other day although she's using her compression pumps every day. Nonetheless she  has significant swelling as well as multiple areas of ulceration and skin opening which is not good. She's trying to keep her legs under control so that she can have her hip surgery. With that being said they're not gonna would likely want to do the surgery if she has open wounds which is why we're trying to get these healed. That's not gonna happen if she doesn't wear compression. This was discussed with her in great detail today. 08/21/18 on evaluation today patient appears to be doing a little better in regard to her bilateral lower extremities although she only kept the wraps on from Friday until Monday. Even prior to that she Artie been snipping the tops to try to release some of the pressure. Nonetheless in the end she ended up with having to remove the wraps come Monday and unfortunately this has led to more swelling and again her legs don't look as well as I would've liked. Fortunately there's no signs of infection at this time. She did have her preop at [facility] and they told her that if her legs didn't look better than they do right now that she would not be able to proceed with the surgery. Electronic Signature(s) Signed: 08/21/2018 4:51:26 PM By: Worthy Keeler PA-C Entered By: Worthy Keeler on 08/21/2018 08:47:09 Theresa [name], Theresa [name] ([id number]) -------------------------------------------------------------------------------- Physical Exam Details Patient Name: Mandarino, Annalysia P. Date of Service: 08/21/2018 8:00 AM  Medical Record Number: [id number] Patient Account Number: [id number] Date of Birth/Sex: Jan 05, 1941 (78 y.o. F) Treating RN: Montey Hora Primary Care Provider: Johny Drilling Other Clinician: Referring Provider: Johny Drilling Treating Provider/Extender: Melburn Hake, HOYT Weeks in Treatment: 6 Constitutional Well-nourished and well-hydrated in no acute distress. Respiratory normal breathing without difficulty. clear to auscultation bilaterally. Cardiovascular regular rate  and rhythm with normal S1, S2. Psychiatric this patient is able to make decisions and demonstrates good insight into disease process. Alert and Oriented x 3. pleasant and cooperative. Notes Patient's wound bed currently showed signs of good drying out as far as most of the wounds are concerned of her lower extremities. She still is an area on the dorsal surface of the left foot that is open and swelling at this time. Fortunately there's no evidence of active infection at this time which is good news. Electronic Signature(s) Signed: 08/21/2018 4:51:26 PM By: Worthy Keeler PA-C Entered By: Worthy Keeler on 08/21/2018 08:47:36 Theresa [name], Theresa [name] ([id number]) -------------------------------------------------------------------------------- Physician Orders Details Patient Name: Theresa [name], [name] Date of Service: 08/21/2018 8:00 AM Medical Record Number: [id number] Patient Account Number: [id number] Date of Birth/Sex: 1940-09-23 (78 y.o. F) Treating RN: Montey Hora Primary Care Provider: Johny Drilling Other Clinician: Referring Provider: Johny Drilling Treating Provider/Extender: Melburn Hake, HOYT Weeks in Treatment: 6 Verbal / Phone Orders: No Diagnosis Coding ICD-10 Coding Code Description E11.622 Type 2 diabetes mellitus with other skin ulcer I87.2 Venous insufficiency (chronic) (peripheral) L97.822 Non-pressure chronic ulcer of other part of left lower leg with fat layer exposed L97.522 Non-pressure chronic ulcer of other part of left foot with fat layer exposed S61.401A Unspecified open wound of right hand, initial encounter L98.492 Non-pressure chronic ulcer of skin 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 I10 Essential (primary) hypertension Wound Cleansing Wound #1 Left,Medial Malleolus o May shower with protection. - DO NOT GET YOUR WRAPS WET Wound #6 Left,Posterior Lower Leg o May shower with protection. - DO NOT GET YOUR WRAPS WET Wound #7 Left,Dorsal Foot o May shower  with protection. - DO NOT GET YOUR WRAPS WET Wound #8 Right,Anterior Lower Leg o May shower with protection. - DO NOT GET YOUR WRAPS WET Wound #9 Right Hand - 2nd Digit o May shower with protection. - DO NOT GET YOUR WRAPS WET Primary Wound Dressing Wound #7 Left,Dorsal Foot o Silver Alginate Wound #9 Right Hand - 2nd Digit o Santyl Ointment Secondary Dressing Wound #1 Left,Medial Malleolus o ABD pad Wound #6 Left,Posterior Lower Leg o ABD pad Wound #7 Left,Dorsal Foot o ABD pad Monteforte, Venba P. ([id number]) Wound #8 Right,Anterior Lower Leg o ABD pad Wound #9 Right Hand - 2nd Digit o Dry Gauze o Other - coverlet Dressing Change Frequency Wound #1 Left,Medial Malleolus o Change dressing every week Wound #6 Left,Posterior Lower Leg o Change dressing every week Wound #7 Left,Dorsal Foot o Change dressing every week Wound #8 Right,Anterior Lower Leg o Change dressing every week Wound #9 Right Hand - 2nd Digit o Change dressing every day. Follow-up Appointments o Return Appointment in 1 week. Edema Control Wound #1 Left,Medial Malleolus o 3 Layer Compression System - Bilateral o Elevate legs to the level of the heart and pump ankles as often as possible Wound #6 Left,Posterior Lower Leg o 3 Layer Compression System - Bilateral o Elevate legs to the level of the heart and pump ankles as often as possible Wound #7 Left,Dorsal Foot o 3 Layer Compression System - Bilateral o Elevate legs to the level of the  heart and pump ankles as often as possible Wound #8 Right,Anterior Lower Leg o 3 Layer Compression System - Bilateral o Elevate legs to the level of the heart and pump ankles as often as possible Electronic Signature(s) Signed: 08/21/2018 4:09:36 PM By: Montey Hora Signed: 08/21/2018 4:51:26 PM By: Worthy Keeler PA-C Entered By: Montey Hora on 08/21/2018 08:45:09 Santilli, [place] P.  ([id number]) -------------------------------------------------------------------------------- Problem List Details Patient Name: Stanly, Jashley P. Date of Service: 08/21/2018 8:00 AM Medical Record Number: [id number] Patient Account Number: [id number] Date of Birth/Sex: [birth year] (78 y.o. F) Treating RN: Montey Hora Primary Care Provider: Johny Drilling Other Clinician: Referring Provider: Johny Drilling Treating Provider/Extender: Melburn Hake, HOYT Weeks in Treatment: 6 Active Problems ICD-10 Evaluated Encounter Code Description Active Date Today Diagnosis E11.622 Type 2 diabetes mellitus with other skin ulcer 07/10/2018 No Yes I87.2 Venous insufficiency (chronic) (peripheral) 07/10/2018 No Yes L97.822 Non-pressure chronic ulcer of other part of left lower leg with 07/10/2018 No Yes fat layer exposed L97.522 Non-pressure chronic ulcer of other part of left foot with fat 08/14/2018 No Yes layer exposed S61.401A Unspecified open wound of right hand, initial encounter 08/14/2018 No Yes L98.492 Non-pressure chronic ulcer of skin of other sites with fat layer 08/14/2018 No Yes exposed I10 Essential (primary) hypertension 07/10/2018 No Yes Inactive Problems Resolved Problems Electronic Signature(s) Signed: 08/21/2018 4:51:26 PM By: Worthy Keeler PA-C Entered By: Worthy Keeler on 08/21/2018 08:15:43 Theresa [name], [name] ([id number]) -------------------------------------------------------------------------------- Progress Note Details Patient Name: Bean, Sopheap P. Date of Service: 08/21/2018 8:00 AM Medical Record Number: [id number] Patient Account Number: [id number] Date of Birth/Sex: 1940/06/06 (78 y.o. F) Treating RN: Montey Hora Primary Care Provider: Johny Drilling Other Clinician: Referring Provider: Johny Drilling Treating Provider/Extender: Melburn Hake, HOYT Weeks in Treatment: 6 Subjective Chief Complaint Information obtained from Patient Left LE ulcers and right hand  ulcer History of Present Illness (HPI) 03/09/18 on evaluation today patient presents for initial inspection in our clinic concerning issues that she has been having with bilateral lower extremity ulcers. She has lymphedema as well as chronic venous stasis. She has seen Dr. dew where she's undergone ABI testing with a left ABI 1.28 a right ABI 1.22. Subsequently she is supposed to be undergoing venous studies as well which are upcoming. She does have a history of diabetes, chronic venous stasis, peripheral vascular disease, and hypertension. Currently these wounds which are currently draining are stated to have been open for roughly 2 weeks or so. It does not appear to be any evidence of infection. No fevers, chills, nausea, or vomiting noted at this time. With that being said the patient does have some prominence to the ankle medially especially on the left side and I'm not sure if something was rubbing which calls this issue either. She is unable to get around and move very well simply due to the fact that she has an issue with her right hip as well which unfortunately prevents her from being able to move around very much. She did have an [place] placed at the request of her daughter when she was at vascular this seems to may be potentially helped a little bit. 03/16/18 on evaluation today patient's right lower extremity appears to likely be completely healed although there still small area of concern which I'm gonna continue to watch. The really does not appear to be any weeping at this point which is good news. Overall I feel like she is done excellent. In regard to left lower extremity this is a  little larger still than the right although again the swelling is indeed down. The wound over the media malleolus does appear to be doing better which is good news. No fevers, chills, nausea, or vomiting noted at this time. Unfortunately the patient still continues to have right hip pain which is getting  more severe. Again her surgeon did not want to undergo surgery until everything was healed as far as the wounds of the right lower extremity. 03/23/18 on evaluation today patient actually appears to be doing excellent in regard to her right lower extremity. There are no open wounds at this point in time and there is no weeping of drainage the compression wraps appear to have done very well for her. She did get the FarrowWrap 4000 Velcro compression wraps which we are gonna initiate treatment with today and this should help continue to keep swelling under control as well is the weeping. Again everything appears to be doing great in regard to the right lower extremity. In regard to left lower extremity everything is pretty much close there are no significant open wounds the only actual open wound that she has is on the medial left ankle which again is the opposite side from where her hip issue is and this is very superficial and shows no signs of infection at this point. 04/02/18 on evaluation today patient actually presents for an early appointment after I saw her last week due to several areas the left lower extremity which had reopened causing some issues at this point. Fortunately there does not appear to be any evidence of infection at this time. With that being said she again is waiting on everything to be better in order to be able to proceed with the surgery for her right hip as previously suggested. 04/09/18 on evaluation today patient actually appears to be doing better in regard to her left lower surety ulcers which is great news. Unfortunately she continues to have issues even on the right side there are no open wounds that she does have a blister that arose on the right at this point as well. Fortunately there's no evidence of infection currently. No fevers, chills, nausea, or vomiting noted at this time. 04/16/18 on evaluation today patient actually appears to be doing very well in regard to  her left lower extremity all the wounds appear to be completely healed including the left medial malleolus which was of greatest concern. The only if and when she has some regard to the right lower extremity where there is a very superficial area of blistering which is open at this point. Fortunately there's no signs of active infection otherwise. Overall I feel like she would be very safe to proceed with that Theresa [name], [name] ([id number]) surgery at this time. 04/21/18 on evaluation today patient presents for early follow-up she was supposed to be here in two days but nonetheless she is here sooner due to the fact that she's been having issues with her right wrap which is actually causing her a lot of discomfort. She states her leg has been more swollen and painful something she really has not experienced in recent weeks. Again this leg has been doing very well there's just a small blister that we were trading on the lateral aspect of her right leg. Again the left leg has pretty much completely resolved. Fortunately upon inspection today there does not appear to be any evidence of active infection systemically. With that being said I'm questioning whether or not she may have  some cellulitis around the right lower extremity especially with the wrap somewhat pushed down and I think she may be scratching at this area. Readmission: 07/10/18 patient presents today for reevaluation our clinic concerning issues that she has been having with her bilateral lower extremities with regard to open wounds/weeping. Right now she's having more than issue on the left posterior lower extremity although there is some weeping from the right posterior lower extremity as well. Fortunately there's no signs of active infection at this time which is good news. She really has not been using lymphedema pumps on a regular basis I do believe this would be of benefit for her. It also sounds like she has not been using the  compression wraps that were Velcro that we got for her last time I think this likewise would also likely done better than just doing the ace wrap or whatnot when occasionally her legs were wrapped. Either way I think right now that if we initiate compression wrapping we can likely get things back under control for her. She needs to have this under control so she can proceed with surgery for her right hip. 07/17/18 on evaluation today patient appears to be doing excellent in regard to her bilateral lower extremity. She's been tolerating the dressing changes without complication. Fortunately there's no signs of active infection at this time. In fact the reps seem to have done your job and honestly I do not see anything open today at all which is excellent news. 08/14/18 upon evaluation today patient's wounds actually appear to be doing significantly worse compared to what I last saw when I evaluated her one month ago. In the interim since that time and now it does not appear that she been wearing her Velcro compression wraps as directed on a daily basis. She tells me that she maybe wears them every other day although she's using her compression pumps every day. Nonetheless she has significant swelling as well as multiple areas of ulceration and skin opening which is not good. She's trying to keep her legs under control so that she can have her hip surgery. With that being said they're not gonna would likely want to do the surgery if she has open wounds which is why we're trying to get these healed. That's not gonna happen if she doesn't wear compression. This was discussed with her in great detail today. 08/21/18 on evaluation today patient appears to be doing a little better in regard to her bilateral lower extremities although she only kept the wraps on from Friday until Monday. Even prior to that she Artie been snipping the tops to try to release some of the pressure. Nonetheless in the end she ended  up with having to remove the wraps come Monday and unfortunately this has led to more swelling and again her legs don't look as well as I would've liked. Fortunately there's no signs of infection at this time. She did have her preop at [facility] and they told her that if her legs didn't look better than they do right now that she would not be able to proceed with the surgery. Patient History Information obtained from Patient. Social History Never smoker, Marital Status - Married, Alcohol Use - Never, Drug Use - No History, Caffeine Use - Never. Medical History Eyes Patient has history of Cataracts - removed Denies history of Glaucoma, Optic Neuritis Ear/Nose/Mouth/Throat Denies history of Chronic sinus problems/congestion, Middle ear problems Hematologic/Lymphatic Denies history of Anemia, Hemophilia, Human Immunodeficiency Virus, Lymphedema, Sickle  Cell Disease Respiratory Denies history of Aspiration, Asthma, Chronic Obstructive Pulmonary Disease (COPD), Pneumothorax, Sleep Apnea, Tuberculosis Alcon, Casady P. ([id number]) Cardiovascular Patient has history of Angina, Coronary Artery Disease, Hypertension, Myocardial Infarction - more than 5 years ago, Peripheral Venous Disease Denies history of Arrhythmia, Congestive Heart Failure, Deep Vein Thrombosis, Hypotension, Peripheral Arterial Disease, Phlebitis, Vasculitis Gastrointestinal Denies history of Cirrhosis , Colitis, Crohn s, Hepatitis A, Hepatitis B, Hepatitis C Endocrine Patient has history of Type II Diabetes Denies history of Type I Diabetes Genitourinary Denies history of End Stage Renal Disease Immunological Denies history of Lupus Erythematosus, Raynaud s, Scleroderma Integumentary (Skin) Denies history of History of Burn, History of pressure wounds Musculoskeletal Patient has history of Osteoarthritis Denies history of Gout, Rheumatoid Arthritis, Osteomyelitis Neurologic Patient has history of Neuropathy Denies  history of Dementia, Quadriplegia, Paraplegia, Seizure Disorder Oncologic Patient has history of Received Chemotherapy, Received Radiation Psychiatric Denies history of Anorexia/bulimia, Confinement Anxiety Medical And Surgical History Notes Genitourinary CKD stage 3 Oncologic 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 Respiratory Denies complaints or symptoms of Chronic or frequent coughs, Shortness of Breath. Cardiovascular Complains or has symptoms of LE edema. Denies complaints or symptoms of Chest pain. Psychiatric Denies complaints or symptoms of Anxiety, Claustrophobia. Objective Constitutional Well-nourished and well-hydrated in no acute distress. Vitals Time Taken: 8:18 AM, Height: 67 in, Weight: 216 lbs, BMI: 33.8, Temperature: 97.7 F, Pulse: 78 bpm, Respiratory Rate: 16 breaths/min, Blood Pressure: 156/61 mmHg. Theresa [name], [name] ([id number]) Respiratory normal breathing without difficulty. clear to auscultation bilaterally. Cardiovascular regular rate and rhythm with normal S1, S2. Psychiatric this patient is able to make decisions and demonstrates good insight into disease process. Alert and Oriented x 3. pleasant and cooperative. General Notes: Patient's wound bed currently showed signs of good drying out as far as most of the wounds are concerned of her lower extremities. She still is an area on the dorsal surface of the left foot that is open and swelling at this time. Fortunately there's no evidence of active infection at this time which is good news. Integumentary (Hair, Skin) Wound #1 status is Open. Original cause of wound was Gradually Appeared. The wound is located on the Left,Medial Malleolus. The wound measures 2.5cm length x 3.5cm width x 0.1cm depth; 6.872cm^2 area and 0.687cm^3 volume.  There is Fat Layer (Subcutaneous Tissue) Exposed exposed. There is no tunneling or undermining noted. There is a none present amount of drainage noted. The wound margin is flat and intact. There is no granulation within the wound bed. There is a large (67-100%) amount of necrotic tissue within the wound bed including Eschar and Adherent Slough. Wound #6 status is Open. Original cause of wound was Gradually Appeared. The wound is located on the Left,Posterior Lower Leg. The wound measures 0.3cm length x 0.3cm width x 0.1cm depth; 0.071cm^2 area and 0.007cm^3 volume. There is Fat Layer (Subcutaneous Tissue) Exposed exposed. There is no tunneling or undermining noted. There is a none present amount of drainage noted. The wound margin is flat and intact. There is small (1-33%) pale granulation within the wound bed. There is a large (67-100%) amount of necrotic tissue within the wound bed including Eschar. Wound #7 status is Open. Original cause of wound was Gradually Appeared. The wound is located on the Left,Dorsal Foot. The wound measures 1.4cm length x 1.6cm width x 0.1cm depth; 1.759cm^2 area and  0.176cm^3 volume. There is Fat Layer (Subcutaneous Tissue) Exposed exposed. There is no tunneling or undermining noted. There is a large amount of serous drainage noted. The wound margin is flat and intact. There is medium (34-66%) pink granulation within the wound bed. There is a medium (34-66%) amount of necrotic tissue within the wound bed including Eschar and Adherent Slough. Wound #8 status is Open. Original cause of wound was Gradually Appeared. The wound is located on the Right,Anterior Lower Leg. The wound measures 1.2cm length x 2cm width x 0.1cm depth; 1.885cm^2 area and 0.188cm^3 volume. There is Fat Layer (Subcutaneous Tissue) Exposed exposed. There is no tunneling or undermining noted. There is a large amount of serous drainage noted. There is small (1-33%) pink granulation within the wound  bed. There is a large (67-100%) amount of necrotic tissue within the wound bed including Eschar and Adherent Slough. Wound #9 status is Open. Original cause of wound was Trauma. The wound is located on the Right Hand - 2nd Digit. The wound measures 2.7cm length x 1cm width x 0.2cm depth; 2.121cm^2 area and 0.424cm^3 volume. There is Fat Layer (Subcutaneous Tissue) Exposed exposed. There is no tunneling or undermining noted. There is a none present amount of drainage noted. The wound margin is thickened. There is medium (34-66%) granulation within the wound bed. There is a medium (34-66%) amount of necrotic tissue within the wound bed including Eschar. 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 ([id number]) Non-pressure chronic ulcer of other part of left foot with fat layer exposed Unspecified open wound of right hand, initial encounter Non-pressure chronic ulcer of skin 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 Essential (primary) hypertension Procedures Wound #9 Pre-procedure diagnosis of Wound #9 is a Trauma, Other located on the Right Hand - 2nd Digit . There was a Chemical/Enzymatic/Mechanical debridement performed by Montey Hora, RN. With the following instrument(s): tongue blade after achieving pain control using Lidocaine 4% Topical Solution. Agent used was [employer]. A time out was conducted at 08:45, prior to the start of the procedure. There was no bleeding. The procedure was tolerated well with a pain level of 0 throughout and a pain level of 0 following the procedure. Post Debridement Measurements: 2.7cm length x 1cm width x 0.1cm depth; 0.212cm^3 volume. Character of Wound/Ulcer Post Debridement is improved. Post procedure Diagnosis Wound #9: Same as Pre-Procedure Plan Wound Cleansing: Wound #1  Left,Medial Malleolus: May shower with protection. - DO NOT GET YOUR WRAPS WET Wound #6 Left,Posterior Lower Leg: May shower with protection. - DO NOT GET YOUR WRAPS WET Wound #7 Left,Dorsal Foot: May shower with protection. - DO NOT GET YOUR WRAPS WET Wound #8 Right,Anterior Lower Leg: May shower with protection. - DO NOT GET YOUR WRAPS WET Wound #9 Right Hand - 2nd Digit: May shower with protection. - DO NOT GET YOUR WRAPS WET Primary Wound Dressing: Wound #7 Left,Dorsal Foot: Silver Alginate Wound #9 Right Hand - 2nd Digit: Santyl Ointment Secondary Dressing: Wound #1 Left,Medial Malleolus: ABD pad Wound #6 Left,Posterior Lower Leg: ABD pad Wound #7 Left,Dorsal Foot: ABD pad Wound #8 Right,Anterior Lower Leg: ABD pad Wound #9 Right Hand - 2nd Digit: Dry Gauze Other - coverlet Dressing Change Frequency: Denk, Percilla P. ([id number]) Wound #1 Left,Medial Malleolus: Change dressing every week Wound #6 Left,Posterior Lower Leg: Change dressing every week Wound #7 Left,Dorsal Foot: Change  dressing every week Wound #8 Right,Anterior Lower Leg: Change dressing every week Wound #9 Right Hand - 2nd Digit: Change dressing every day. Follow-up Appointments: Return Appointment in 1 week. Edema Control: Wound #1 Left,Medial Malleolus: 3 Layer Compression System - Bilateral Elevate legs to the level of the heart and pump ankles as often as possible Wound #6 Left,Posterior Lower Leg: 3 Layer Compression System - Bilateral Elevate legs to the level of the heart and pump ankles as often as possible Wound #7 Left,Dorsal Foot: 3 Layer Compression System - Bilateral Elevate legs to the level of the heart and pump ankles as often as possible Wound #8 Right,Anterior Lower Leg: 3 Layer Compression System - Bilateral Elevate legs to the level of the heart and pump ankles as often as possible My suggestion at this point is gonna be that we go ahead and continue with the above wound  care measures. I even advise the patient she can use the lymphedema pumps a third time at noon if she needs to to try to keep the fluid down. This is of utmost importance as far as being able to controller fluid. If the wrap start to get to the point where they are hurting her too badly she was advised to contact the office and let us know and we will subsequently see about getting her into have this change. If anything else changes in the meantime she will let us know as well. She has to keep these wraps on however for gonna get her legs better to be ready for the surgery in four weeks time. Please see above for specific wound care orders. We will see patient for re-evaluation in 1 week(s) here in the clinic. If anything worsens or changes patient will contact our office for additional recommendations. Electronic Signature(s) Signed: 08/21/2018 4:51:26 PM By: Worthy Keeler PA-C Entered By: Worthy Keeler on 08/21/2018 08:48:20 Andre, Luz Mamie [name] ([id number]) -------------------------------------------------------------------------------- ROS/PFSH Details Patient Name: Speagle, Dillan P. Date of Service: 08/21/2018 8:00 AM Medical Record Number: [id number] Patient Account Number: [id number] Date of Birth/Sex: June 28, 1940 (78 y.o. F) Treating RN: Montey Hora Primary Care Provider: Johny Drilling Other Clinician: Referring Provider: Johny Drilling Treating Provider/Extender: Melburn Hake, HOYT Weeks in Treatment: 6 Information Obtained From Patient Constitutional Symptoms (General Health) Complaints and Symptoms: Negative for: Fatigue; Fever; Chills; Marked Weight Change Respiratory Complaints and Symptoms: Negative for: Chronic or frequent coughs; Shortness of Breath Medical History: Negative for: Aspiration; Asthma; Chronic Obstructive Pulmonary Disease (COPD); Pneumothorax; Sleep Apnea; Tuberculosis Cardiovascular Complaints and Symptoms: Positive for: LE edema Negative for: Chest  pain Medical History: Positive for: Angina; Coronary Artery Disease; Hypertension; Myocardial Infarction - more than 5 years ago; Peripheral Venous Disease Negative for: Arrhythmia; Congestive Heart Failure; Deep Vein Thrombosis; Hypotension; Peripheral Arterial Disease; Phlebitis; Vasculitis Psychiatric Complaints and Symptoms: Negative for: Anxiety; Claustrophobia Medical History: Negative for: Anorexia/bulimia; Confinement Anxiety Eyes Medical History: Positive for: Cataracts - removed Negative for: Glaucoma; Optic Neuritis Ear/Nose/Mouth/Throat Medical History: Negative for: Chronic sinus problems/congestion; Middle ear problems Hematologic/Lymphatic MERRILL, DEANDA. ([id number]) Medical History: Negative for: Anemia; Hemophilia; Human Immunodeficiency Virus; Lymphedema; Sickle Cell Disease Gastrointestinal Medical History: Negative for: Cirrhosis ; Colitis; Crohnos; Hepatitis A; Hepatitis B; Hepatitis C Endocrine Medical History: Positive for: Type II Diabetes Negative for: Type I Diabetes Treated with: Insulin Blood sugar tested every day: Yes Tested : Blood sugar testing results: Breakfast: 211; Lunch: 240 Genitourinary Medical History: Negative for: End Stage Renal Disease Past Medical History Notes: CKD stage 3 Immunological Medical History:  Negative for: Lupus Erythematosus; Raynaudos; Scleroderma Integumentary (Skin) Medical History: Negative for: History of Burn; History of pressure wounds Musculoskeletal Medical History: Positive for: Osteoarthritis Negative for: Gout; Rheumatoid Arthritis; Osteomyelitis Neurologic Medical History: Positive for: Neuropathy Negative for: Dementia; Quadriplegia; Paraplegia; Seizure Disorder Oncologic Medical History: Positive for: Received Chemotherapy; Received Radiation Past Medical History Notes: 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 ([id number]) Immunizations Pneumococcal Vaccine: Received Pneumococcal Vaccination: Yes Implantable Devices No devices added Family and Social History Never smoker; Marital Status - Married; Alcohol Use: Never; Drug Use: No History; Caffeine Use: Never; Financial Concerns: No; Food, Clothing or Shelter Needs: No; Support System Lacking: No; Transportation Concerns: No Physician Affirmation I have reviewed and agree with the above information. Electronic Signature(s) Signed: 08/21/2018 4:09:36 PM By: Montey Hora Signed: 08/21/2018 4:51:26 PM By: Worthy Keeler PA-C Entered By: Worthy Keeler on 08/21/2018 08:47:26 Banwart, Caley P. ([id number]) -------------------------------------------------------------------------------- SuperBill Details Patient Name: Rosenburg, Anecia P. Date of Service: 08/21/2018 Medical Record Number: [id number] Patient Account Number: [id number] Date of Birth/Sex: 04/25/1940 (78 y.o. F) Treating RN: Montey Hora Primary Care Provider: Johny Drilling Other Clinician: Referring Provider: Johny Drilling Treating Provider/Extender: Melburn Hake, HOYT Weeks in Treatment: 6 Diagnosis Coding ICD-10 Codes Code Description E11.622 Type 2 diabetes mellitus with other skin ulcer I87.2 Venous insufficiency (chronic) (peripheral) L97.822 Non-pressure chronic ulcer of other part of left lower leg with fat layer exposed L97.522 Non-pressure chronic ulcer of other part of left foot with fat layer exposed S61.401A Unspecified open wound of right hand, initial encounter L98.492 Non-pressure chronic ulcer of skin 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 [place] (primary) hypertension Facility Procedures CPT4 Code: 17001749 Description: 44967 - DEBRIDE W/O ANES NON SELECT Modifier: Quantity: 1 Physician Procedures CPT4 Code Description: 5916384 66599 - WC PHYS LEVEL 4 - EST PT ICD-10 Diagnosis Description E11.622 Type 2 diabetes mellitus with other skin ulcer  I87.2 Venous insufficiency (chronic) (peripheral) L97.822 Non-pressure chronic ulcer of other part of left  lower leg with L97.522 Non-pressure chronic ulcer of other part of left foot with fat Modifier: fat layer expos layer exposed Quantity: 1 ed Electronic Signature(s) Signed: 08/21/2018 4:51:26 PM By: Worthy Keeler PA-C Entered By: Worthy Keeler on 08/21/2018 08:48:48

## 2018-08-21 NOTE — Progress Notes (Signed)
NATHANIEL, YADEN ([id number]) Visit Report for 08/21/2018 Arrival Information Details Patient Name: VIRDA, BETTERS. Date of Service: 08/21/2018 8:00 AM Medical Record Number: [id number] Patient Account Number: [id number] Date of Birth/Sex: 12/15/1940 (78 y.o. F) Treating RN: Army Melia Primary Care Catherina Pates: Johny Drilling Other Clinician: Referring Shonice Wrisley: Johny Drilling Treating Graelyn Bihl/Extender: Melburn Hake, HOYT Weeks in Treatment: 6 Visit Information History Since Last Visit Added or deleted any medications: No Patient Arrived: Wheel Chair Any new allergies or adverse reactions: No Arrival Time: 08:17 Had a fall or experienced change in No Accompanied By: daughter activities of daily living that may affect Transfer Assistance: Manual risk of falls: Patient Has Alerts: Yes Signs or symptoms of abuse/neglect since last visito No Patient Alerts: DMII Hospitalized since last visit: No ABI 03/06/18 L 1.28 R 1.22 Pain Present Now: No Electronic Signature(s) Signed: 08/21/2018 11:47:58 AM By: Army Melia Entered By: Army Melia on 08/21/2018 08:18:09 Dundon, Brealyn Mamie Nick ([id number]) -------------------------------------------------------------------------------- Encounter Discharge Information Details Patient Name: Hamidi, Jocee P. Date of Service: 08/21/2018 8:00 AM Medical Record Number: [id number] Patient Account Number: [id number] Date of Birth/Sex: September 15, 1940 (78 y.o. F) Treating RN: Army Melia Primary Care Temesha Queener: Johny Drilling Other Clinician: Referring Sharis Keeran: Johny Drilling Treating Estreya Clay/Extender: Melburn Hake, HOYT Weeks in Treatment: 6 Encounter Discharge Information Items Post Procedure Vitals Discharge Condition: Stable Temperature (F): 97.7 Ambulatory Status: Wheelchair Pulse (bpm): 78 Discharge Destination: Home Respiratory Rate (breaths/min): 16 Transportation: Private Auto Blood Pressure (mmHg): 156/61 Accompanied By: daughter Schedule  Follow-up Appointment: Yes Clinical Summary of Care: Provided Form Type Recipient Paper Patient cb Electronic Signature(s) Signed: 08/21/2018 11:59:18 AM By: Lorine Bears RCP, RRT, CHT Previous Signature: 08/21/2018 8:59:07 AM Version By: Lorine Bears RCP, RRT, CHT Entered By: Lorine Bears on 08/21/2018 09:00:14 Nuno, Ronalda P. ([id number]) -------------------------------------------------------------------------------- Lower Extremity Assessment Details Patient Name: Daluz, Kimberlin P. Date of Service: 08/21/2018 8:00 AM Medical Record Number: [id number] Patient Account Number: [id number] Date of Birth/Sex: 1940/07/12 (78 y.o. F) Treating RN: Army Melia Primary Care Jniyah Dantuono: Johny Drilling Other Clinician: Referring Deondre Marinaro: Johny Drilling Treating Delaynie Stetzer/Extender: Melburn Hake, HOYT Weeks in Treatment: 6 Edema Assessment Assessed: [Left: No] [Right: No] Edema: [Left: No] [Right: No] Calf Left: Right: Point of Measurement: 34 cm From Medial Instep 43 cm 41 cm Ankle Left: Right: Point of Measurement: 11 cm From Medial Instep 24 cm 24 cm Vascular Assessment Pulses: Dorsalis Pedis Palpable: [Left:Yes] [Right:Yes] Electronic Signature(s) Signed: 08/21/2018 11:47:58 AM By: Army Melia Signed: 08/21/2018 4:09:36 PM By: Montey Hora Entered By: Montey Hora on 08/21/2018 08:36:48 Demuro, Swayze P. ([id number]) -------------------------------------------------------------------------------- Multi Wound Chart Details Patient Name: Mahaffy, Finnley P. Date of Service: 08/21/2018 8:00 AM Medical Record Number: [id number] Patient Account Number: [id number] Date of Birth/Sex: 10-21-1940 (78 y.o. F) Treating RN: Montey Hora Primary Care Surah Pelley: Johny Drilling Other Clinician: Referring Evalyne Cortopassi: Johny Drilling Treating Hadley Detloff/Extender: Melburn Hake, HOYT Weeks in Treatment: 6 Vital Signs Height(in): 53 Pulse(bpm): 70 Weight(lbs):  216 Blood Pressure(mmHg): 156/61 Body Mass Index(BMI): 34 Temperature(F): 97.7 Respiratory Rate 16 (breaths/min): Photos: Wound Location: Left Malleolus - Medial Left Lower Leg - Posterior Left Foot - Dorsal Wounding Event: Gradually Appeared Gradually Appeared Gradually Appeared Primary Etiology: Diabetic Wound/Ulcer of the Diabetic Wound/Ulcer of the Diabetic Wound/Ulcer of the Lower Extremity Lower Extremity Lower Extremity Secondary Etiology: Venous Leg Ulcer N/A N/A Comorbid History: Cataracts, Angina, Coronary Cataracts, Angina, Coronary Cataracts, Angina, Coronary Artery Disease, Hypertension, Artery Disease, Hypertension, Artery Disease, Hypertension, Myocardial Infarction, Myocardial Infarction, Myocardial Infarction, Peripheral Venous Disease, Peripheral Venous Disease, Peripheral  Venous Disease, Type II Diabetes, Type II Diabetes, Type II Diabetes, Osteoarthritis, Neuropathy, Osteoarthritis, Neuropathy, Osteoarthritis, Neuropathy, Received Chemotherapy, Received Chemotherapy, Received Chemotherapy, Received Radiation Received Radiation Received Radiation Date Acquired: 12/07/2017 06/26/2018 07/29/2018 Weeks of Treatment: 23 6 1  Wound Status: Open Open Open Measurements L x W x D 2.5x3.5x0.1 0.3x0.3x0.1 1.4x1.6x0.1 (cm) Area (cm) : 6.872 0.071 1.759 Volume (cm) : 0.687 0.007 0.176 % Reduction in Area: -994.30% 96.40% 65.00% % Reduction in Volume: -990.50% 96.40% 65.00% Classification: Grade 2 Grade 2 Grade 2 Exudate Amount: None Present None Present Large Exudate Type: N/A N/A Serous Exudate Color: N/A N/A amber Wound Margin: Flat and Intact Flat and Intact Flat and Intact Granulation Amount: None Present (0%) Small (1-33%) Medium (34-66%) Granulation Quality: N/A Pale Pink Servais, Allanna P. ([id number]) Necrotic Amount: Large (67-100%) Large (67-100%) Medium (34-66%) Necrotic Tissue: Eschar, Adherent Slough Eschar Eschar, Adherent Slough Exposed Structures: Fat  Layer (Subcutaneous Fat Layer (Subcutaneous Fat Layer (Subcutaneous Tissue) Exposed: Yes Tissue) Exposed: Yes Tissue) Exposed: Yes Fascia: No Fascia: No Fascia: No Tendon: No Tendon: No Tendon: No Muscle: No Muscle: No Muscle: No Joint: No Joint: No Joint: No Bone: No Bone: No Bone: No Epithelialization: Medium (34-66%) None None Wound Number: 8 9 N/A Photos: N/A Wound Location: Right Lower Leg - Anterior Right Hand - 2nd Digit N/A Wounding Event: Gradually Appeared Trauma N/A Primary Etiology: Diabetic Wound/Ulcer of the Trauma, Other N/A Lower Extremity Secondary Etiology: N/A N/A N/A Comorbid History: Cataracts, Angina, Coronary Cataracts, Angina, Coronary N/A Artery Disease, Hypertension, Artery Disease, Hypertension, Myocardial Infarction, Myocardial Infarction, Peripheral Venous Disease, Peripheral Venous Disease, Type II Diabetes, Type II Diabetes, Osteoarthritis, Neuropathy, Osteoarthritis, Neuropathy, Received Chemotherapy, Received Chemotherapy, Received Radiation Received Radiation Date Acquired: 07/29/2018 08/07/2018 N/A Weeks of Treatment: 1 1 N/A Wound Status: Open Open N/A Measurements L x W x D 1.2x2x0.1 2.7x1x0.2 N/A (cm) Area (cm) : 1.885 2.121 N/A Volume (cm) : 0.188 0.424 N/A % Reduction in Area: 0.00% -170.20% N/A % Reduction in Volume: 0.00% -170.10% N/A Classification: Grade 2 Full Thickness Without N/A Exposed Support Structures Exudate Amount: Large None Present N/A Exudate Type: Serous N/A N/A Exudate Color: amber N/A N/A Wound Margin: N/A Thickened N/A Granulation Amount: Small (1-33%) Medium (34-66%) N/A Granulation Quality: Pink N/A N/A Necrotic Amount: Large (67-100%) Medium (34-66%) N/A Necrotic Tissue: Eschar, Adherent Slough Eschar N/A Exposed Structures: Fat Layer (Subcutaneous Fat Layer (Subcutaneous N/A Tissue) Exposed: Yes Tissue) Exposed: Yes Fascia: No Fascia: No Tendon: No Tendon: No Muscle: No Muscle: No Duff,  Takiya P. ([id number]) Joint: No Joint: No Bone: No Bone: No Epithelialization: None None N/A Treatment Notes Electronic Signature(s) Signed: 08/21/2018 4:09:36 PM By: Montey Hora Entered By: Montey Hora on 08/21/2018 08:37:04 Peavler, Derionna Mamie Nick ([id number]) -------------------------------------------------------------------------------- Leslie Details Patient Name: Gubler, Syncere P. Date of Service: 08/21/2018 8:00 AM Medical Record Number: [id number] Patient Account Number: [id number] Date of Birth/Sex: [birth year] (78 y.o. F) Treating RN: Montey Hora Primary Care Anthonee Gelin: Johny Drilling Other Clinician: Referring Sable Knoles: Johny Drilling Treating Sevin Farone/Extender: Melburn Hake, HOYT Weeks in Treatment: 6 Active Inactive Abuse / Safety / Falls / Self Care Management Nursing Diagnoses: Impaired physical mobility Goals: Patient will not develop complications from immobility Date Initiated: 07/10/2018 Target Resolution Date: 11/27/2018 Goal Status: Active Interventions: Assess fall risk on admission and as needed Notes: Venous Leg Ulcer Nursing Diagnoses: Actual venous Insuffiency (use after diagnosis is confirmed) Goals: Patient will maintain optimal edema control Date Initiated: 07/10/2018 Target Resolution Date: 11/27/2018 Goal Status: Active Interventions: Compression as ordered  Provide education on venous insufficiency Notes: Wound/Skin Impairment Nursing Diagnoses: Impaired tissue integrity Goals: Ulcer/skin breakdown will heal within 14 weeks Date Initiated: 07/10/2018 Target Resolution Date: 11/27/2018 Goal Status: Active Interventions: CLORA, OHMER ([id number]) Assess patient/caregiver ability to obtain necessary supplies Assess patient/caregiver ability to perform ulcer/skin care regimen upon admission and as needed Assess ulceration(s) every visit Notes: Electronic Signature(s) Signed: 08/21/2018 4:09:36 PM By: Montey Hora Entered By: Montey Hora on 08/21/2018 08:36:54 Rorke, Maziah P. ([id number]) -------------------------------------------------------------------------------- Pain Assessment Details Patient Name: Kady, Darlisa P. Date of Service: 08/21/2018 8:00 AM Medical Record Number: [id number] Patient Account Number: [id number] Date of Birth/Sex: [birth year] (78 y.o. F) Treating RN: Army Melia Primary Care Anvitha Hutmacher: Johny Drilling Other Clinician: Referring Ademola Vert: Johny Drilling Treating Khyra Viscuso/Extender: Melburn Hake, HOYT Weeks in Treatment: 6 Active Problems Location of Pain Severity and Description of Pain Patient Has Paino No Site Locations Pain Management and Medication Current Pain Management: Electronic Signature(s) Signed: 08/21/2018 11:47:58 AM By: Army Melia Entered By: Army Melia on 08/21/2018 08:18:23 Amborn, Hye Mamie Nick ([id number]) -------------------------------------------------------------------------------- Patient/Caregiver Education Details Patient Name: Swango, Jonni P. Date of Service: 08/21/2018 8:00 AM Medical Record Number: [id number] Patient Account Number: [id number] Date of Birth/Gender: [birth year] (78 y.o. F) Treating RN: Montey Hora Primary Care Physician: Johny Drilling Other Clinician: Referring Physician: Johny Drilling Treating Physician/Extender: Sharalyn Ink in Treatment: 6 Education Assessment Education Provided To: Patient and Caregiver Education Topics Provided Venous: Handouts: Other: use pumps and keep wraps on Methods: Explain/Verbal Responses: State content correctly Electronic Signature(s) Signed: 08/21/2018 4:09:36 PM By: Montey Hora Entered By: Montey Hora on 08/21/2018 10:05:08 Tomey, Kasidi P. ([id number]) -------------------------------------------------------------------------------- Wound Assessment Details Patient Name: Janoski, Dayle P. Date of Service: 08/21/2018 8:00 AM Medical Record Number:  [id number] Patient Account Number: [id number] Date of Birth/Sex: December 23, 1940 (78 y.o. F) Treating RN: Army Melia Primary Care Gennavieve Huq: Johny Drilling Other Clinician: Referring Sussan Meter: Johny Drilling Treating Saroya Riccobono/Extender: Melburn Hake, HOYT Weeks in Treatment: 6 Wound Status Wound Number: 1 Primary Diabetic Wound/Ulcer of the Lower Extremity Etiology: Wound Location: Left Malleolus - Medial Secondary Venous Leg Ulcer Wounding Event: Gradually Appeared Etiology: Date Acquired: 12/07/2017 Wound Open Weeks Of Treatment: 23 Status: Clustered Wound: No Comorbid Cataracts, Angina, Coronary Artery Disease, History: Hypertension, Myocardial Infarction, Peripheral Venous Disease, Type II Diabetes, Osteoarthritis, Neuropathy, Received Chemotherapy, Received Radiation Photos Wound Measurements Length: (cm) 2.5 % Reduction Width: (cm) 3.5 % Reduction Depth: (cm) 0.1 Epitheliali Area: (cm) 6.872 Tunneling: Volume: (cm) 0.687 Underminin in Area: -994.3% in Volume: -990.5% zation: Medium (34-66%) No g: No Wound Description Classification: Grade 2 Foul Odor A Wound Margin: Flat and Intact Slough/Fibr Exudate Amount: None Present fter Cleansing: No ino Yes Wound Bed Granulation Amount: None Present (0%) Exposed Structure Necrotic Amount: Large (67-100%) Fascia Exposed: No Necrotic Quality: Eschar, Adherent Slough Fat Layer (Subcutaneous Tissue) Exposed: Yes Tendon Exposed: No Muscle Exposed: No Joint Exposed: No Bone Exposed: No Sussman, Farryn P. ([id number]) Treatment Notes Wound #1 (Left, Medial Malleolus) Notes ABD, 3 layer Electronic Signature(s) Signed: 08/21/2018 11:47:58 AM By: Army Melia Entered By: Army Melia on 08/21/2018 08:25:50 Lamoreaux, Lamesha P. ([id number]) -------------------------------------------------------------------------------- Wound Assessment Details Patient Name: Laba, Jameya P. Date of Service: 08/21/2018 8:00 AM Medical Record  Number: [id number] Patient Account Number: [id number] Date of Birth/Sex: 09/06/1940 (78 y.o. F) Treating RN: Army Melia Primary Care Yliana Gravois: Johny Drilling Other Clinician: Referring Petrona Wyeth: Johny Drilling Treating Lamyah Creed/Extender: Melburn Hake, HOYT Weeks in Treatment: 6 Wound Status Wound Number: 6 Primary Diabetic Wound/Ulcer of the Lower Extremity Etiology: Wound  Location: Left Lower Leg - Posterior Wound Open Wounding Event: Gradually Appeared Status: Date Acquired: 06/26/2018 Comorbid Cataracts, Angina, Coronary Artery Disease, Weeks Of Treatment: 6 History: Hypertension, Myocardial Infarction, Peripheral Clustered Wound: No Venous Disease, Type II Diabetes, Osteoarthritis, Neuropathy, Received Chemotherapy, Received Radiation Photos Wound Measurements Length: (cm) 0.3 % Reduction in Width: (cm) 0.3 % Reduction in Depth: (cm) 0.1 Epithelializat Area: (cm) 0.071 Tunneling: Volume: (cm) 0.007 Undermining: Area: 96.4% Volume: 96.4% ion: None No No Wound Description Classification: Grade 2 Foul Odor Afte Wound Margin: Flat and Intact Slough/Fibrino Exudate Amount: None Present r Cleansing: No Yes Wound Bed Granulation Amount: Small (1-33%) Exposed Structure Granulation Quality: Pale Fascia Exposed: No Necrotic Amount: Large (67-100%) Fat Layer (Subcutaneous Tissue) Exposed: Yes Necrotic Quality: Eschar Tendon Exposed: No Muscle Exposed: No Joint Exposed: No Bone Exposed: No Treatment Notes Gargis, Maleigh P. ([id number]) Wound #6 (Left, Posterior Lower Leg) Notes ABD, 3 layer Electronic Signature(s) Signed: 08/21/2018 11:47:58 AM By: Army Melia Entered By: Army Melia on 08/21/2018 08:27:17 Vallee, Chetara P. ([id number]) -------------------------------------------------------------------------------- Wound Assessment Details Patient Name: Haglund, Tiyana P. Date of Service: 08/21/2018 8:00 AM Medical Record Number: [id number] Patient Account  Number: [id number] Date of Birth/Sex: December 29, 1940 (78 y.o. F) Treating RN: Army Melia Primary Care Cort Dragoo: Johny Drilling Other Clinician: Referring Rishith Siddoway: Johny Drilling Treating Jamilett Ferrante/Extender: Melburn Hake, HOYT Weeks in Treatment: 6 Wound Status Wound Number: 7 Primary Diabetic Wound/Ulcer of the Lower Extremity Etiology: Wound Location: Left Foot - Dorsal Wound Open Wounding Event: Gradually Appeared Status: Date Acquired: 07/29/2018 Comorbid Cataracts, Angina, Coronary Artery Disease, Weeks Of Treatment: 1 History: Hypertension, Myocardial Infarction, Peripheral Clustered Wound: No Venous Disease, Type II Diabetes, Osteoarthritis, Neuropathy, Received Chemotherapy, Received Radiation Photos Wound Measurements Length: (cm) 1.4 % Reduction i Width: (cm) 1.6 % Reduction i Depth: (cm) 0.1 Epithelializa Area: (cm) 1.759 Tunneling: Volume: (cm) 0.176 Undermining: n Area: 65% n Volume: 65% tion: None No No Wound Description Classification: Grade 2 Foul Odor Aft Wound Margin: Flat and Intact Slough/Fibrin Exudate Amount: Large Exudate Type: Serous Exudate Color: amber er Cleansing: No o Yes Wound Bed Granulation Amount: Medium (34-66%) Exposed Structure Granulation Quality: Pink Fascia Exposed: No Necrotic Amount: Medium (34-66%) Fat Layer (Subcutaneous Tissue) Exposed: Yes Necrotic Quality: Eschar, Adherent Slough Tendon Exposed: No Muscle Exposed: No Joint Exposed: No Bone Exposed: No Louison, Boots P. ([id number]) Treatment Notes Wound #7 (Left, Dorsal Foot) Notes silvercel, abd, 3 layer wrap with unna to anchor; finger - santyl and coverlet to right index finger Electronic Signature(s) Signed: 08/21/2018 11:47:58 AM By: Army Melia Entered By: Army Melia on 08/21/2018 08:27:54 Sellinger, Maryam P. ([id number]) -------------------------------------------------------------------------------- Wound Assessment Details Patient Name: Panagopoulos, Audriana  P. Date of Service: 08/21/2018 8:00 AM Medical Record Number: [id number] Patient Account Number: [id number] Date of Birth/Sex: 13-May-1940 (78 y.o. F) Treating RN: Army Melia Primary Care Damean Poffenberger: Johny Drilling Other Clinician: Referring Sherryann Frese: Johny Drilling Treating Rhetta Cleek/Extender: Melburn Hake, HOYT Weeks in Treatment: 6 Wound Status Wound Number: 8 Primary Diabetic Wound/Ulcer of the Lower Extremity Etiology: Wound Location: Right Lower Leg - Anterior Wound Open Wounding Event: Gradually Appeared Status: Date Acquired: 07/29/2018 Comorbid Cataracts, Angina, Coronary Artery Disease, Weeks Of Treatment: 1 History: Hypertension, Myocardial Infarction, Peripheral Clustered Wound: No Venous Disease, Type II Diabetes, Osteoarthritis, Neuropathy, Received Chemotherapy, Received Radiation Photos Wound Measurements Length: (cm) 1.2 % Reduction Width: (cm) 2 % Reduction Depth: (cm) 0.1 Epithelializ Area: (cm) 1.885 Tunneling: Volume: (cm) 0.188 Undermining in Area: 0% in Volume: 0% ation: None No : No Wound Description Classification: Grade  2 Foul Odor Af Exudate Amount: Large Slough/Fibri Exudate Type: Serous Exudate Color: amber ter Cleansing: No no Yes Wound Bed Granulation Amount: Small (1-33%) Exposed Structure Granulation Quality: Pink Fascia Exposed: No Necrotic Amount: Large (67-100%) Fat Layer (Subcutaneous Tissue) Exposed: Yes Necrotic Quality: Eschar, Adherent Slough Tendon Exposed: No Muscle Exposed: No Joint Exposed: No Bone Exposed: No Halber[place], Philana P. ([id number]) Treatment Notes Wound #8 (Right, Anterior Lower Leg) Notes ABD, 3 layer Electronic Signature(s) Signed: 08/21/2018 11:47:58 AM By: Army Melia Entered By: Army Melia on 08/21/2018 08:28:27 Frith, Ciela P. ([id number]) -------------------------------------------------------------------------------- Wound Assessment Details Patient Name: Schiavo, Brooklee P. Date of Service:  08/21/2018 8:00 AM Medical Record Number: [id number] Patient Account Number: [id number] Date of Birth/Sex: 10-Aug-1940 (78 y.o. F) Treating RN: Army Melia Primary Care Vieno Tarrant: Johny Drilling Other Clinician: Referring Nihira Puello: Johny Drilling Treating Helina Hullum/Extender: Melburn Hake, HOYT Weeks in Treatment: 6 Wound Status Wound Number: 9 Primary Trauma, Other Etiology: Wound Location: Right Hand - 2nd Digit Wound Open Wounding Event: Trauma Status: Date Acquired: 08/07/2018 Comorbid Cataracts, Angina, Coronary Artery Disease, Weeks Of Treatment: 1 History: Hypertension, Myocardial Infarction, Peripheral Clustered Wound: No Venous Disease, Type II Diabetes, Osteoarthritis, Neuropathy, Received Chemotherapy, Received Radiation Photos Wound Measurements Length: (cm) 2.7 Width: (cm) 1 Depth: (cm) 0.2 Area: (cm) 2.121 Volume: (cm) 0.424 % Reduction in Area: -170.2% % Reduction in Volume: -170.1% Epithelialization: None Tunneling: No Undermining: No Wound Description Full Thickness Without Exposed Support Foul Odor Classification: Structures Slough/Fi Wound Margin: Thickened Exudate None Present Amount: After Cleansing: No brino Yes Wound Bed Granulation Amount: Medium (34-66%) Exposed Structure Necrotic Amount: Medium (34-66%) Fascia Exposed: No Necrotic Quality: Eschar Fat Layer (Subcutaneous Tissue) Exposed: Yes Tendon Exposed: No Muscle Exposed: No Joint Exposed: No Bone Exposed: No Jaskolski, Mylei P. ([id number]) Treatment Notes Wound #9 (Right Hand - 2nd Digit) Notes santyl, coverlet Electronic Signature(s) Signed: 08/21/2018 11:47:58 AM By: Army Melia Entered By: Army Melia on 08/21/2018 08:29:00 Gorton, Gaylin P. ([id number]) -------------------------------------------------------------------------------- Vitals Details Patient Name: Schiller, Sydell P. Date of Service: 08/21/2018 8:00 AM Medical Record Number: [id number] Patient Account Number:  [id number] Date of Birth/Sex: 11-19-1940 (78 y.o. F) Treating RN: Army Melia Primary Care Joshu Furukawa: Johny Drilling Other Clinician: Referring Eliab Closson: Johny Drilling Treating Jamica Woodyard/Extender: Melburn Hake, HOYT Weeks in Treatment: 6 Vital Signs Time Taken: 08:18 Temperature (F): 97.7 Height (in): 67 Pulse (bpm): 78 Weight (lbs): 216 Respiratory Rate (breaths/min): 16 Body Mass Index (BMI): 33.8 Blood Pressure (mmHg): 156/61 Reference Range: 80 - 120 mg / dl Electronic Signature(s) Signed: 08/21/2018 11:47:58 AM By: Army Melia Entered By: Army Melia on 08/21/2018 08:18:56

## 2018-08-28 ENCOUNTER — Other Ambulatory Visit: Payer: Self-pay

## 2018-08-28 ENCOUNTER — Encounter: Payer: Medicare HMO | Admitting: Physician Assistant

## 2018-08-28 DIAGNOSIS — E11622 Type 2 diabetes mellitus with other skin ulcer: Secondary | ICD-10-CM | POA: Diagnosis not present

## 2018-08-30 NOTE — Progress Notes (Signed)
Theresa [name], Theresa [name] ([id number]) Visit Report for 08/28/2018 Arrival Information Details Patient Name: Theresa [name], Theresa [name]. Date of Service: 08/28/2018 8:15 AM Medical Record Number: [id number] Patient Account Number: [id number] Date of Birth/Sex: 05-04-1940 (78 y.o. F) Treating RN: Theresa [name] Primary Care Theresa [name]: Theresa [name] Other Clinician: Referring Theresa [name]: Theresa [name] Treating Theresa [name]/Extender: Theresa [name], Theresa [name] in Treatment: 7 Visit Information History Since Last Visit Added or deleted any medications: No Patient Arrived: Wheel Chair Any new allergies or adverse reactions: No Arrival Time: 08:19 Had a fall or experienced change in No Accompanied By: daughter activities of daily living that may affect Transfer Assistance: None risk of falls: Patient Identification Verified: Yes Signs or symptoms of abuse/neglect since last visito No Secondary Verification Process Yes Hospitalized since last visit: No Completed: Implantable device outside of the clinic excluding No Patient Has Alerts: Yes cellular tissue based products placed in the center Patient Alerts: DMII since last visit: ABI 03/06/18 L 1.28 R Has Dressing in Place as Prescribed: Yes 1.22 Has Compression in Place as Prescribed: Yes Pain Present Now: No Electronic Signature(s) Signed: 08/28/2018 4:33:47 PM By: Theresa [name] Entered By: Theresa [name] on 08/28/2018 08:20:48 Theresa [name] ([id number]) -------------------------------------------------------------------------------- Encounter Discharge Information Details Patient Name: Theresa [name], Theresa [name]. Date of Service: 08/28/2018 8:15 AM Medical Record Number: [id number] Patient Account Number: [id number] Date of Birth/Sex: [birth year] (78 y.o. F) Treating RN: Theresa [name] Primary Care Cylah Fannin: Theresa [name] Other Clinician: Referring Akela Pocius: Theresa [name] Treating Trenita Hulme/Extender: Theresa [name], Theresa [name] in Treatment: 7 Encounter Discharge  Information Items Post Procedure Vitals Discharge Condition: Stable Temperature (F): 97.9 Ambulatory Status: Wheelchair Pulse (bpm): 82 Discharge Destination: Home Respiratory Rate (breaths/min): 18 Transportation: Private Auto Blood Pressure (mmHg): 144/53 Accompanied By: daughter Schedule Follow-up Appointment: Yes Clinical Summary of Care: Electronic Signature(s) Signed: 08/28/2018 4:33:47 PM By: Theresa [name] Entered By: Theresa [name] on 08/28/2018 09:05:07 Theresa [name]. ([id number]) -------------------------------------------------------------------------------- Lower Extremity Assessment Details Patient Name: Theresa [name], Theresa [name]. Date of Service: 08/28/2018 8:15 AM Medical Record Number: [id number] Patient Account Number: [id number] Date of Birth/Sex: 04-25-1940 (78 y.o. F) Treating RN: Theresa [name] Primary Care Mitch Arquette: Theresa [name] Other Clinician: Referring Dannie Hattabaugh: Theresa [name] Treating Rose-Marie Hickling/Extender: Theresa [name], Theresa [name] in Treatment: 7 Edema Assessment Assessed: [Left: No] [Right: No] Edema: [Left: Yes] [Right: Yes] Calf Left: Right: Point of Measurement: 34 cm From Medial Instep 36.2 cm 36.5 cm Ankle Left: Right: Point of Measurement: 11 cm From Medial Instep 24 cm 24.5 cm Vascular Assessment Pulses: Dorsalis Pedis Palpable: [Left:Yes] [Right:Yes] Electronic Signature(s) Signed: 08/28/2018 4:33:47 PM By: Theresa [name] Entered By: Theresa [name] on 08/28/2018 08:31:06 Theresa [name]. ([id number]) -------------------------------------------------------------------------------- Multi Wound Chart Details Patient Name: Theresa [name], Theresa [name]. Date of Service: 08/28/2018 8:15 AM Medical Record Number: [id number] Patient Account Number: [id number] Date of Birth/Sex: 17-Aug-1940 (78 y.o. F) Treating RN: Theresa [name] Primary Care Gearldine Looney: Theresa [name] Other Clinician: Referring Massiah Longanecker: Theresa [name] Treating Theresa [name]/Extender: Theresa [name],  Theresa [name] in Treatment: 7 Vital Signs Height(in): 50 Pulse(bpm): 72 Weight(lbs): 216 Blood Pressure(mmHg): 144/53 Body Mass Index(BMI): 34 Temperature(F): 97.9 Respiratory Rate 18 (breaths/min): Photos: Wound Location: Left, Medial Malleolus Left, Posterior Lower Leg Left Foot - Dorsal Wounding Event: Gradually Appeared Gradually Appeared Gradually Appeared Primary Etiology: Diabetic Wound/Ulcer of the Diabetic Wound/Ulcer of the Diabetic Wound/Ulcer of the Lower Extremity Lower Extremity Lower Extremity Secondary Etiology: Venous Leg Ulcer N/A N/A Comorbid History: N/A N/A Cataracts, Angina, Coronary Artery Disease, Hypertension, Myocardial Infarction, Peripheral Venous Disease, Type II Diabetes, Osteoarthritis, Neuropathy, Received Chemotherapy, Received Radiation Date  Acquired: 12/07/2017 06/26/2018 07/29/2018 [name] of Treatment: 24 7 2  Wound Status: Healed - Epithelialized Healed - Epithelialized Open Measurements L x W x D 0x0x0 0x0x0 0.5x0.4x0.1 (cm) Area (cm) : 0 0 0.157 Volume (cm) : 0 0 0.016 % Reduction in Area: 100.00% 100.00% 96.90% % Reduction in Volume: 100.00% 100.00% 96.80% Classification: Grade 2 Grade 2 Grade 2 Exudate Amount: N/A N/A Medium Exudate Type: N/A N/A Serous Exudate Color: N/A N/A amber Wound Margin: N/A N/A Flat and Intact Granulation Amount: N/A N/A Large (67-100%) Granulation Quality: N/A N/A Pink Theresa [name]. ([id number]) Necrotic Amount: N/A N/A None Present (0%) Epithelialization: N/A N/A None Wound Number: 8 9 N/A Photos: N/A Wound Location: Right, Anterior Lower Leg Right Hand - 2nd Digit N/A Wounding Event: Gradually Appeared Trauma N/A Primary Etiology: Diabetic Wound/Ulcer of the Trauma, Other N/A Lower Extremity Secondary Etiology: N/A N/A N/A Comorbid History: N/A Cataracts, Angina, Coronary N/A Artery Disease, Hypertension, Myocardial Infarction, Peripheral Venous Disease, Type II Diabetes, Osteoarthritis,  Neuropathy, Received Chemotherapy, Received Radiation Date Acquired: 07/29/2018 08/07/2018 N/A [name] of Treatment: 2 2 N/A Wound Status: Healed - Epithelialized Open N/A Measurements L x W x D 0x0x0 0.3x0.2x0.1 N/A (cm) Area (cm) : 0 0.047 N/A Volume (cm) : 0 0.005 N/A % Reduction in Area: 100.00% 94.00% N/A % Reduction in Volume: 100.00% 96.80% N/A Classification: Grade 2 Full Thickness Without N/A Exposed Support Structures Exudate Amount: N/A None Present N/A Exudate Type: N/A N/A N/A Exudate Color: N/A N/A N/A Wound Margin: N/A Thickened N/A Granulation Amount: N/A Large (67-100%) N/A Granulation Quality: N/A Pink N/A Necrotic Amount: N/A None Present (0%) N/A Exposed Structures: N/A Fat Layer (Subcutaneous N/A Tissue) Exposed: Yes Fascia: No Tendon: No Muscle: No Joint: No Bone: No Epithelialization: N/A Small (1-33%) N/A Treatment Notes Electronic Signature(s) Signed: 08/28/2018 4:33:47 PM By: Elsie Ra, Fredna Dow ([id number]) Entered By: Theresa [name] on 08/28/2018 08:55:19 Theresa [name], Theresa [name] ([id number]) -------------------------------------------------------------------------------- Multi-Disciplinary Care Plan Details Patient Name: Mossbarger, Mykell [name]. Date of Service: 08/28/2018 8:15 AM Medical Record Number: [id number] Patient Account Number: [id number] Date of Birth/Sex: December 24, 1940 (78 y.o. F) Treating RN: Theresa [name] Primary Care Alaria Oconnor: Theresa [name] Other Clinician: Referring Alissia Lory: Theresa [name] Treating Dorthey Depace/Extender: Theresa [name], Theresa [name] in Treatment: 7 Active Inactive Abuse / Safety / Falls / Self Care Management Nursing Diagnoses: Impaired physical mobility Goals: Patient will not develop complications from immobility Date Initiated: 07/10/2018 Target Resolution Date: 11/27/2018 Goal Status: Active Interventions: Assess fall risk on admission and as needed Notes: Venous Leg Ulcer Nursing Diagnoses: Actual venous  Insuffiency (use after diagnosis is confirmed) Goals: Patient will maintain optimal edema control Date Initiated: 07/10/2018 Target Resolution Date: 11/27/2018 Goal Status: Active Interventions: Compression as ordered Provide education on venous insufficiency Notes: Wound/Skin Impairment Nursing Diagnoses: Impaired tissue integrity Goals: Ulcer/skin breakdown will heal within 14 [name] Date Initiated: 07/10/2018 Target Resolution Date: 11/27/2018 Goal Status: Active Interventions: MARYHELEN, LINDLER ([id number]) Assess patient/caregiver ability to obtain necessary supplies Assess patient/caregiver ability to perform ulcer/skin care regimen upon admission and as needed Assess ulceration(s) every visit Notes: Electronic Signature(s) Signed: 08/28/2018 4:33:47 PM By: Theresa [name] Entered By: Theresa [name] on 08/28/2018 08:55:11 Theresa [name], Theresa [name]. ([id number]) -------------------------------------------------------------------------------- Pain Assessment Details Patient Name: Hagemann, Elliet [name]. Date of Service: 08/28/2018 8:15 AM Medical Record Number: [id number] Patient Account Number: [id number] Date of Birth/Sex: [birth year] (78 y.o. F) Treating RN: Theresa [name] Primary Care Fedora Knisely: Theresa [name] Other Clinician: Referring Hazelynn Mckenny: Theresa [name] Treating Nanna Ertle/Extender: Theresa [name], Theresa [name] in Treatment: 7 Active  Problems Location of Pain Severity and Description of Pain Patient Has Paino No Site Locations Pain Management and Medication Current Pain Management: Electronic Signature(s) Signed: 08/28/2018 4:33:47 PM By: Theresa [name] Entered By: Theresa [name] on 08/28/2018 08:20:55 Arguijo, Sydni Mamie [name] ([id number]) -------------------------------------------------------------------------------- Patient/Caregiver Education Details Patient Name: Theresa [name], Theresa [name]. Date of Service: 08/28/2018 8:15 AM Medical Record Number: [id number] Patient Account Number:  [id number] Date of Birth/Gender: 11-14-1940 (78 y.o. F) Treating RN: Theresa [name] Primary Care Physician: Theresa [name] Other Clinician: Referring Physician: Johny [name] Treating Physician/Extender: Sharalyn Ink in Treatment: 7 Education Assessment Education Provided To: Patient and Caregiver Education Topics Provided Venous: Handouts: Other: leg elevation and using pumps Methods: Explain/Verbal Responses: State content correctly Electronic Signature(s) Signed: 08/28/2018 4:33:47 PM By: Theresa [name] Entered By: Theresa [name] on 08/28/2018 09:03:53 Theresa [name], Theresa [name]. ([id number]) -------------------------------------------------------------------------------- Wound Assessment Details Patient Name: Theresa [name], Theresa [name]. Date of Service: 08/28/2018 8:15 AM Medical Record Number: [id number] Patient Account Number: [id number] Date of Birth/Sex: 1940/09/24 (78 y.o. F) Treating RN: Theresa [name] Primary Care Shmuel Girgis: Theresa [name] Other Clinician: Referring Aleese Kamps: Theresa [name] Treating Analise Glotfelty/Extender: Theresa [name], Theresa [name] in Treatment: 7 Wound Status Wound Number: 1 Primary Etiology: Diabetic Wound/Ulcer of the Lower Extremity Wound Location: Left, Medial Malleolus Secondary Venous Leg Ulcer Wounding Event: Gradually Appeared Etiology: Date Acquired: 12/07/2017 Wound Status: Healed - Epithelialized [name] Of Treatment: 24 Clustered Wound: No Photos Photo Uploaded By: Gretta Cool, BSN, RN, CWS, Kim on 08/28/2018 08:49:58 Wound Measurements Length: (cm) 0 % Re Width: (cm) 0 % Re Depth: (cm) 0 Area: (cm) 0 Volume: (cm) 0 duction in Area: 100% duction in Volume: 100% Wound Description Classification: Grade 2 Electronic Signature(s) Signed: 08/28/2018 4:33:47 PM By: Theresa [name] Entered By: Theresa [name] on 08/28/2018 08:35:51 Theresa [name], Theresa [name]. ([id number]) -------------------------------------------------------------------------------- Wound Assessment  Details Patient Name: Theresa [name], Theresa [name]. Date of Service: 08/28/2018 8:15 AM Medical Record Number: [id number] Patient Account Number: [id number] Date of Birth/Sex: Aug 05, 1940 (78 y.o. F) Treating RN: Theresa [name] Primary Care Reana Chacko: Theresa [name] Other Clinician: Referring Chene Kasinger: Theresa [name] Treating Deago Burruss/Extender: Theresa [name], Theresa [name] in Treatment: 7 Wound Status Wound Number: 6 Primary Diabetic Wound/Ulcer of the Lower Etiology: Extremity Wound Location: Left, Posterior Lower Leg Wound Status: Healed - Epithelialized Wounding Event: Gradually Appeared Date Acquired: 06/26/2018 [name] Of Treatment: 7 Clustered Wound: No Photos Photo Uploaded By: Gretta Cool, BSN, RN, CWS, Kim on 08/28/2018 08:49:58 Wound Measurements Length: (cm) 0 % Red Width: (cm) 0 % Red Depth: (cm) 0 Area: (cm) 0 Volume: (cm) 0 uction in Area: 100% uction in Volume: 100% Wound Description Classification: Grade 2 Electronic Signature(s) Signed: 08/28/2018 4:33:47 PM By: Theresa [name] Entered By: Theresa [name] on 08/28/2018 08:36:51 Theresa [name], Theresa [name]. ([id number]) -------------------------------------------------------------------------------- Wound Assessment Details Patient Name: Yano, Cardelia [name]. Date of Service: 08/28/2018 8:15 AM Medical Record Number: [id number] Patient Account Number: [id number] Date of Birth/Sex: [birth year] (78 y.o. F) Treating RN: Theresa [name] Primary Care Farouk Vivero: Theresa [name] Other Clinician: Referring Genowefa Morga: Theresa [name] Treating Kyrielle Urbanski/Extender: Theresa [name], Theresa [name] in Treatment: 7 Wound Status Wound Number: 7 Primary Diabetic Wound/Ulcer of the Lower Extremity Etiology: Wound Location: Left Foot - Dorsal Wound Open Wounding Event: Gradually Appeared Status: Date Acquired: 07/29/2018 Comorbid Cataracts, Angina, Coronary Artery Disease, [name] Of Treatment: 2 History: Hypertension, Myocardial Infarction, Peripheral Clustered Wound: No Venous  Disease, Type II Diabetes, Osteoarthritis, Neuropathy, Received Chemotherapy, Received Radiation Photos Wound Measurements Length: (cm) 0.5 % Reduction in Width: (cm) 0.4 % Reduction in Depth: (cm) 0.1 Epithelializati Area: (cm) 0.157 Tunneling: Volume: (  cm) 0.016 Undermining: Area: 96.9% Volume: 96.8% on: None No No Wound Description Classification: Grade 2 Foul Odor After Wound Margin: Flat and Intact Slough/Fibrino Exudate Amount: Medium Exudate Type: Serous Exudate Color: amber Cleansing: No No Wound Bed Granulation Amount: Large (67-100%) Exposed Structure Granulation Quality: Pink Fascia Exposed: No Necrotic Amount: None Present (0%) Fat Layer (Subcutaneous Tissue) Exposed: Yes Tendon Exposed: No Muscle Exposed: No Joint Exposed: No Bone Exposed: No Pepper, Lena [name]. ([id number]) Treatment Notes Wound #7 (Left, Dorsal Foot) Notes finger - santyl and coverlet; BLE silvercel and kerlix/coban wrap Electronic Signature(s) Signed: 08/28/2018 4:33:47 PM By: Theresa [name] Entered By: Theresa [name] on 08/28/2018 08:41:01 Cost, Cleta [name]. ([id number]) -------------------------------------------------------------------------------- Wound Assessment Details Patient Name: Heitzenrater, [place] [name]. Date of Service: 08/28/2018 8:15 AM Medical Record Number: [id number] Patient Account Number: [id number] Date of Birth/Sex: 07/02/1940 (78 y.o. F) Treating RN: Theresa [name] Primary Care Jahnay Lantier: Theresa [name] Other Clinician: Referring Loni Abdon: Theresa [name] Treating Suzzanne Brunkhorst/Extender: Theresa [name], Theresa [name] in Treatment: 7 Wound Status Wound Number: 8 Primary Diabetic Wound/Ulcer of the Lower Etiology: Extremity Wound Location: Right, Anterior Lower Leg Wound Status: Healed - Epithelialized Wounding Event: Gradually Appeared Date Acquired: 07/29/2018 [name] Of Treatment: 2 Clustered Wound: No Photos Photo Uploaded By: Gretta Cool, BSN, RN, CWS, Kim on 08/28/2018  08:50:28 Wound Measurements Length: (cm) 0 % Red Width: (cm) 0 % Red Depth: (cm) 0 Area: (cm) 0 Volume: (cm) 0 uction in Area: 100% uction in Volume: 100% Wound Description Classification: Grade 2 Electronic Signature(s) Signed: 08/28/2018 4:33:47 PM By: Theresa [name] Entered By: Theresa [name] on 08/28/2018 08:39:27 Joss, Faithe [name]. ([id number]) -------------------------------------------------------------------------------- Wound Assessment Details Patient Name: Clodfelter, Florentina [name]. Date of Service: 08/28/2018 8:15 AM Medical Record Number: [id number] Patient Account Number: [id number] Date of Birth/Sex: 27-Dec-1940 (78 y.o. F) Treating RN: Theresa [name] Primary Care Eniola Cerullo: Theresa [name] Other Clinician: Referring Rodrick Payson: Theresa [name] Treating Kalle Bernath/Extender: Theresa [name], Theresa [name] in Treatment: 7 Wound Status Wound Number: 9 Primary Trauma, Other Etiology: Wound Location: Right Hand - 2nd Digit Wound Open Wounding Event: Trauma Status: Date Acquired: 08/07/2018 Comorbid Cataracts, Angina, Coronary Artery Disease, [name] Of Treatment: 2 History: Hypertension, Myocardial Infarction, Peripheral Clustered Wound: No Venous Disease, Type II Diabetes, Osteoarthritis, Neuropathy, Received Chemotherapy, Received Radiation Photos Wound Measurements Length: (cm) 0.3 % Reducti Width: (cm) 0.2 % Reducti Depth: (cm) 0.1 Epithelia Area: (cm) 0.047 Tunnelin Volume: (cm) 0.005 Undermin on in Area: 94% on in Volume: 96.8% lization: Small (1-33%) g: No ing: No Wound Description Full Thickness Without Exposed Support Foul Odor Classification: Structures Slough/Fi Wound Margin: Thickened Exudate None Present Amount: After Cleansing: No brino Yes Wound Bed Granulation Amount: Large (67-100%) Exposed Structure Granulation Quality: Pink Fascia Exposed: No Necrotic Amount: None Present (0%) Fat Layer (Subcutaneous Tissue) Exposed: Yes Tendon Exposed:  No Muscle Exposed: No Joint Exposed: No Bone Exposed: No Repsher, Delaney [name]. ([id number]) Treatment Notes Wound #9 (Right Hand - 2nd Digit) Notes finger - santyl and coverlet; BLE silvercel and kerlix/coban wrap Electronic Signature(s) Signed: 08/28/2018 4:33:47 PM By: Theresa [name] Entered By: Theresa [name] on 08/28/2018 08:41:35 Fernandez, Zandrea [name]. ([id number]) -------------------------------------------------------------------------------- Vitals Details Patient Name: Rautio, Caroll [name]. Date of Service: 08/28/2018 8:15 AM Medical Record Number: [id number] Patient Account Number: [id number] Date of Birth/Sex: October 02, 1940 (78 y.o. F) Treating RN: Theresa [name] Primary Care Tashaun Obey: Theresa [name] Other Clinician: Referring Mike Berntsen: Theresa [name] Treating Lenda Baratta/Extender: Theresa [name], Theresa [name] in Treatment: 7 Vital Signs Time Taken: 08:20 Temperature (F): 97.9 Height (in): 67 Pulse (bpm): 82 Weight (lbs): 216 Respiratory  Rate (breaths/min): 18 Body Mass Index (BMI): 33.8 Blood Pressure (mmHg): 144/53 Reference Range: 80 - 120 mg / dl Electronic Signature(s) Signed: 08/28/2018 4:33:47 PM By: Theresa [name] Entered By: Theresa [name] on 08/28/2018 08:22:44

## 2018-08-30 NOTE — Progress Notes (Signed)
HELMA, ARGYLE ([id number]) Visit Report for 08/28/2018 Chief Complaint Document Details Patient Name: Theresa [name]. Date of Service: 08/28/2018 8:15 AM Medical Record Number: [id number] Patient Account Number: [id number] Date of Birth/Sex: May 08, 1940 (78 y.o. F) Treating RN: Montey Hora Primary Care Provider: Johny Drilling Other Clinician: Referring Provider: Johny Drilling Treating Provider/Extender: Melburn Hake, HOYT Weeks in Treatment: 7 Information Obtained from: Patient Chief Complaint Left LE ulcers and right hand ulcer Electronic Signature(s) Signed: 08/30/2018 6:04:32 PM By: Worthy Keeler PA-C Entered By: Worthy Keeler on 08/28/2018 08:24:23 [name], [name] ([id number]) -------------------------------------------------------------------------------- Debridement Details Patient Name: [name], [name] Date of Service: 08/28/2018 8:15 AM Medical Record Number: [id number] Patient Account Number: [id number] Date of Birth/Sex: [birth year] (78 y.o. F) Treating RN: Montey Hora Primary Care Provider: Johny Drilling Other Clinician: Referring Provider: Johny Drilling Treating Provider/Extender: Melburn Hake, HOYT Weeks in Treatment: 7 Debridement Performed for Wound #9 Right Hand - 2nd Digit Assessment: Performed By: Physician STONE III, HOYT E., PA-C Debridement Type: Debridement Level of Consciousness (Pre- Awake and Alert procedure): Pre-procedure Verification/Time Yes - 08:58 Out Taken: Start Time: 08:58 Pain Control: Lidocaine 4% Topical Solution Total Area Debrided (L x W): 0.3 (cm) x 0.2 (cm) = 0.06 (cm) Tissue and other material Viable, Non-Viable, Callus, Slough, Subcutaneous, Skin: Dermis , Skin: Epidermis, Slough debrided: Level: Skin/Subcutaneous Tissue Debridement Description: Excisional Instrument: Curette Bleeding: Minimum Hemostasis Achieved: Pressure End Time: 09:02 Procedural Pain: 0 Post Procedural Pain: 0 Response to Treatment: Procedure  was tolerated well Level of Consciousness Awake and Alert (Post-procedure): Post Debridement Measurements of Total Wound Length: (cm) 0.3 Width: (cm) 0.2 Depth: (cm) 0.2 Volume: (cm) 0.009 Character of Wound/Ulcer Post Debridement: Improved Post Procedure Diagnosis Same as Pre-procedure Electronic Signature(s) Signed: 08/28/2018 4:33:47 PM By: Montey Hora Signed: 08/30/2018 6:04:32 PM By: Worthy Keeler PA-C Entered By: Montey Hora on 08/28/2018 09:02:39 [name], [name] ([id number]) -------------------------------------------------------------------------------- HPI Details Patient Name: Theresa [name], [name] Date of Service: 08/28/2018 8:15 AM Medical Record Number: [id number] Patient Account Number: [id number] Date of Birth/Sex: 11-27-1940 (78 y.o. F) Treating RN: Montey Hora Primary Care Provider: Johny Drilling Other Clinician: Referring Provider: Johny Drilling Treating Provider/Extender: Melburn Hake, HOYT Weeks in Treatment: 7 History of Present Illness HPI Description: 03/09/18 on evaluation today patient presents for initial inspection in our clinic concerning issues that she has been having with bilateral lower extremity ulcers. She has lymphedema as well as chronic venous stasis. She has seen Dr. dew where she's undergone ABI testing with a left ABI 1.28 a right ABI 1.22. Subsequently she is supposed to be undergoing venous studies as well which are upcoming. She does have a history of diabetes, chronic venous stasis, peripheral vascular disease, and hypertension. Currently these wounds which are currently draining are stated to have been open for roughly 2 weeks or so. It does not appear to be any evidence of infection. No fevers, chills, nausea, or vomiting noted at this time. With that being said the patient does have some prominence to the ankle medially especially on the left side and I'm not sure if something was rubbing which calls this issue either. She is unable  to get around and move very well simply due to the fact that she has an issue with her right hip as well which unfortunately prevents her from being able to move around very much. She did have an [place] placed at the request of her daughter when she was at vascular this seems to may be potentially helped a  little bit. 03/16/18 on evaluation today patient's right lower extremity appears to likely be completely healed although there still small area of concern which I'm gonna continue to watch. The really does not appear to be any weeping at this point which is good news. Overall I feel like she is done excellent. In regard to left lower extremity this is a little larger still than the right although again the swelling is indeed down. The wound over the media malleolus does appear to be doing better which is good news. No fevers, chills, nausea, or vomiting noted at this time. Unfortunately the patient still continues to have right hip pain which is getting more severe. Again her surgeon did not want to undergo surgery until everything was healed as far as the wounds of the right lower extremity. 03/23/18 on evaluation today patient actually appears to be doing excellent in regard to her right lower extremity. There are no open wounds at this point in time and there is no weeping of drainage the compression wraps appear to have done very well for her. She did get the FarrowWrap 4000 Velcro compression wraps which we are gonna initiate treatment with today and this should help continue to keep swelling under control as well is the weeping. Again everything appears to be doing great in regard to the right lower extremity. In regard to left lower extremity everything is pretty much close there are no significant open wounds the only actual open wound that she has is on the medial left ankle which again is the opposite side from where her hip issue is and this is very superficial and shows no signs of  infection at this point. 04/02/18 on evaluation today patient actually presents for an early appointment after I saw her last week due to several areas the left lower extremity which had reopened causing some issues at this point. Fortunately there does not appear to be any evidence of infection at this time. With that being said she again is waiting on everything to be better in order to be able to proceed with the surgery for her right hip as previously suggested. 04/09/18 on evaluation today patient actually appears to be doing better in regard to her left lower surety ulcers which is great news. Unfortunately she continues to have issues even on the right side there are no open wounds that she does have a blister that arose on the right at this point as well. Fortunately there's no evidence of infection currently. No fevers, chills, nausea, or vomiting noted at this time. 04/16/18 on evaluation today patient actually appears to be doing very well in regard to her left lower extremity all the wounds appear to be completely healed including the left medial malleolus which was of greatest concern. The only if and when she has some regard to the right lower extremity where there is a very superficial area of blistering which is open at this point. Fortunately there's no signs of active infection otherwise. Overall I feel like she would be very safe to proceed with that surgery at this time. 04/21/18 on evaluation today patient presents for early follow-up she was supposed to be here in two days but nonetheless she is here sooner due to the fact that she's been having issues with her right wrap which is actually causing her a lot of discomfort. She states her leg has been more swollen and painful something she really has not experienced in recent weeks. Mela, Perham Lanora P. ([id number]) Again  this leg has been doing very well there's just a small blister that we were trading on the lateral aspect of her  right leg. Again the left leg has pretty much completely resolved. Fortunately upon inspection today there does not appear to be any evidence of active infection systemically. With that being said I'm questioning whether or not she may have some cellulitis around the right lower extremity especially with the wrap somewhat pushed down and I think she may be scratching at this area. Readmission: 07/10/18 patient presents today for reevaluation our clinic concerning issues that she has been having with her bilateral lower extremities with regard to open wounds/weeping. Right now she's having more than issue on the left posterior lower extremity although there is some weeping from the right posterior lower extremity as well. Fortunately there's no signs of active infection at this time which is good news. She really has not been using lymphedema pumps on a regular basis I do believe this would be of benefit for her. It also sounds like she has not been using the compression wraps that were Velcro that we got for her last time I think this likewise would also likely done better than just doing the ace wrap or whatnot when occasionally her legs were wrapped. Either way I think right now that if we initiate compression wrapping we can likely get things back under control for her. She needs to have this under control so she can proceed with surgery for her right hip. 07/17/18 on evaluation today patient appears to be doing excellent in regard to her bilateral lower extremity. She's been tolerating the dressing changes without complication. Fortunately there's no signs of active infection at this time. In fact the reps seem to have done your job and honestly I do not see anything open today at all which is excellent news. 08/14/18 upon evaluation today patient's wounds actually appear to be doing significantly worse compared to what I last saw when I evaluated her one month ago. In the interim since that time  and now it does not appear that she been wearing her Velcro compression wraps as directed on a daily basis. She tells me that she maybe wears them every other day although she's using her compression pumps every day. Nonetheless she has significant swelling as well as multiple areas of ulceration and skin opening which is not good. She's trying to keep her legs under control so that she can have her hip surgery. With that being said they're not gonna would likely want to do the surgery if she has open wounds which is why we're trying to get these healed. That's not gonna happen if she doesn't wear compression. This was discussed with her in great detail today. 08/21/18 on evaluation today patient appears to be doing a little better in regard to her bilateral lower extremities although she only kept the wraps on from Friday until Monday. Even prior to that she Artie been snipping the tops to try to release some of the pressure. Nonetheless in the end she ended up with having to remove the wraps come Monday and unfortunately this has led to more swelling and again her legs don't look as well as I would've liked. Fortunately there's no signs of infection at this time. She did have her preop at [facility] and they told her that if her legs didn't look better than they do right now that she would not be able to proceed with the surgery. 08/28/2018 on evaluation  today patient actually appears to be doing significantly better in general with regard to her bilateral lower extremity ulcers. She has been tolerating the dressing changes without complication. The good news is most of the wounds have actually healed up and seem to be doing quite well her swelling is also dramatically improved. Overall the main area that is still open on her lower extremities is really just her left dorsal foot the other areas are just small weeping areas that are drying out. In regard to her finger this likewise is seems to be doing  very well today Electronic Signature(s) Signed: 08/28/2018 9:27:03 AM By: Worthy Keeler PA-C Entered By: Worthy Keeler on 08/28/2018 09:27:03 Donnell, Evalee P. ([id number]) -------------------------------------------------------------------------------- Physical Exam Details Patient Name: Attig, Paz P. Date of Service: 08/28/2018 8:15 AM Medical Record Number: [id number] Patient Account Number: [id number] Date of Birth/Sex: [birth year] (78 y.o. F) Treating RN: Montey Hora Primary Care Provider: Johny Drilling Other Clinician: Referring Provider: Johny Drilling Treating Provider/Extender: Melburn Hake, HOYT Weeks in Treatment: 7 Constitutional Well-nourished and well-hydrated in no acute distress. Respiratory normal breathing without difficulty. clear to auscultation bilaterally. Cardiovascular regular rate and rhythm with normal S1, S2. Psychiatric this patient is able to make decisions and demonstrates good insight into disease process. Alert and Oriented x 3. pleasant and cooperative. Notes At this time patient does have an open wound on the dorsal surface of her foot she also has her finger ulcer which is still open. The finger did require some sharp debridement to remove callus and slough from the surface of the wound post debridement this appears to be doing much better is healing quite nicely. No debridement was required on the lower extremities at this time although compression therapy will be recommended to continue. Electronic Signature(s) Signed: 08/28/2018 9:29:37 AM By: Worthy Keeler PA-C Entered By: Worthy Keeler on 08/28/2018 09:29:37 Theresa [name], Theresa [name] ([id number]) -------------------------------------------------------------------------------- Physician Orders Details Patient Name: Theresa [name], [name] Date of Service: 08/28/2018 8:15 AM Medical Record Number: [id number] Patient Account Number: [id number] Date of Birth/Sex: 22-Mar-1940 (78 y.o. F) Treating RN:  Montey Hora Primary Care Provider: Johny Drilling Other Clinician: Referring Provider: Johny Drilling Treating Provider/Extender: Melburn Hake, HOYT Weeks in Treatment: 7 Verbal / Phone Orders: No Diagnosis Coding ICD-10 Coding Code Description E11.622 Type 2 diabetes mellitus with other skin ulcer I87.2 Venous insufficiency (chronic) (peripheral) L97.822 Non-pressure chronic ulcer of other part of left lower leg with fat layer exposed L97.522 Non-pressure chronic ulcer of other part of left foot with fat layer exposed S61.401A Unspecified open wound of right hand, initial encounter L98.492 Non-pressure chronic ulcer of skin 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 I10 Essential (primary) hypertension Wound Cleansing Wound #7 Left,Dorsal Foot o May shower with protection. - DO NOT GET YOUR WRAPS WET Wound #9 Right Hand - 2nd Digit o May shower with protection. - DO NOT GET YOUR WRAPS WET Primary Wound Dressing Wound #7 Left,Dorsal Foot o Silver Alginate Wound #9 Right Hand - 2nd Digit o Santyl Ointment Secondary Dressing Wound #7 Left,Dorsal Foot o ABD pad Wound #9 Right Hand - 2nd Digit o Dry Gauze o Other - coverlet Dressing Change Frequency Wound #7 Left,Dorsal Foot o Change dressing every week Wound #9 Right Hand - 2nd Digit o Change dressing every day. Follow-up Appointments o Return Appointment in 1 week. Ryanne, Morand Dhwani P. ([id number]) Edema Control Wound #7 Left,Dorsal Foot o Kerlix and Coban - Bilateral o Elevate legs to the level of the heart and pump  ankles as often as possible Electronic Signature(s) Signed: 08/28/2018 4:33:47 PM By: Montey Hora Signed: 08/30/2018 6:04:32 PM By: Worthy Keeler PA-C Entered By: Montey Hora on 08/28/2018 09:03:25 [place]burg, [name] ([id number]) -------------------------------------------------------------------------------- Problem List Details Patient Name: Theresa [name], [name] Date of Service:  08/28/2018 8:15 AM Medical Record Number: [id number] Patient Account Number: [id number] Date of Birth/Sex: 1940/09/12 (78 y.o. F) Treating RN: Montey Hora Primary Care Provider: Johny Drilling Other Clinician: Referring Provider: Johny Drilling Treating Provider/Extender: Melburn Hake, HOYT Weeks in Treatment: 7 Active Problems ICD-10 Evaluated Encounter Code Description Active Date Today Diagnosis E11.622 Type 2 diabetes mellitus with other skin ulcer 07/10/2018 No Yes I87.2 Venous insufficiency (chronic) (peripheral) 07/10/2018 No Yes L97.822 Non-pressure chronic ulcer of other part of left lower leg with 07/10/2018 No Yes fat layer exposed L97.522 Non-pressure chronic ulcer of other part of left foot with fat 08/14/2018 No Yes layer exposed S61.401A Unspecified open wound of right hand, initial encounter 08/14/2018 No Yes L98.492 Non-pressure chronic ulcer of skin of other sites with fat layer 08/14/2018 No Yes exposed I10 Essential (primary) hypertension 07/10/2018 No Yes Inactive Problems Resolved Problems Electronic Signature(s) Signed: 08/30/2018 6:04:32 PM By: Worthy Keeler PA-C Entered By: Worthy Keeler on 08/28/2018 08:24:14 Theresa [name], [name] ([id number]) -------------------------------------------------------------------------------- Progress Note Details Patient Name: Theresa [name], [name] Date of Service: 08/28/2018 8:15 AM Medical Record Number: [id number] Patient Account Number: [id number] Date of Birth/Sex: [birth year] (78 y.o. F) Treating RN: Montey Hora Primary Care Provider: Johny Drilling Other Clinician: Referring Provider: Johny Drilling Treating Provider/Extender: Melburn Hake, HOYT Weeks in Treatment: 7 Subjective Chief Complaint Information obtained from Patient Left LE ulcers and right hand ulcer History of Present Illness (HPI) 03/09/18 on evaluation today patient presents for initial inspection in our clinic concerning issues that she has been having  with bilateral lower extremity ulcers. She has lymphedema as well as chronic venous stasis. She has seen Dr. dew where she's undergone ABI testing with a left ABI 1.28 a right ABI 1.22. Subsequently she is supposed to be undergoing venous studies as well which are upcoming. She does have a history of diabetes, chronic venous stasis, peripheral vascular disease, and hypertension. Currently these wounds which are currently draining are stated to have been open for roughly 2 weeks or so. It does not appear to be any evidence of infection. No fevers, chills, nausea, or vomiting noted at this time. With that being said the patient does have some prominence to the ankle medially especially on the left side and I'm not sure if something was rubbing which calls this issue either. She is unable to get around and move very well simply due to the fact that she has an issue with her right hip as well which unfortunately prevents her from being able to move around very much. She did have an [place] placed at the request of her daughter when she was at vascular this seems to may be potentially helped a little bit. 03/16/18 on evaluation today patient's right lower extremity appears to likely be completely healed although there still small area of concern which I'm gonna continue to watch. The really does not appear to be any weeping at this point which is good news. Overall I feel like she is done excellent. In regard to left lower extremity this is a little larger still than the right although again the swelling is indeed down. The wound over the media malleolus does appear to be doing better which is good news. No fevers,  chills, nausea, or vomiting noted at this time. Unfortunately the patient still continues to have right hip pain which is getting more severe. Again her surgeon did not want to undergo surgery until everything was healed as far as the wounds of the right lower extremity. 03/23/18 on evaluation  today patient actually appears to be doing excellent in regard to her right lower extremity. There are no open wounds at this point in time and there is no weeping of drainage the compression wraps appear to have done very well for her. She did get the FarrowWrap 4000 Velcro compression wraps which we are gonna initiate treatment with today and this should help continue to keep swelling under control as well is the weeping. Again everything appears to be doing great in regard to the right lower extremity. In regard to left lower extremity everything is pretty much close there are no significant open wounds the only actual open wound that she has is on the medial left ankle which again is the opposite side from where her hip issue is and this is very superficial and shows no signs of infection at this point. 04/02/18 on evaluation today patient actually presents for an early appointment after I saw her last week due to several areas the left lower extremity which had reopened causing some issues at this point. Fortunately there does not appear to be any evidence of infection at this time. With that being said she again is waiting on everything to be better in order to be able to proceed with the surgery for her right hip as previously suggested. 04/09/18 on evaluation today patient actually appears to be doing better in regard to her left lower surety ulcers which is great news. Unfortunately she continues to have issues even on the right side there are no open wounds that she does have a blister that arose on the right at this point as well. Fortunately there's no evidence of infection currently. No fevers, chills, nausea, or vomiting noted at this time. 04/16/18 on evaluation today patient actually appears to be doing very well in regard to her left lower extremity all the wounds appear to be completely healed including the left medial malleolus which was of greatest concern. The only if and when  she has some regard to the right lower extremity where there is a very superficial area of blistering which is open at this point. Fortunately there's no signs of active infection otherwise. Overall I feel like she would be very safe to proceed with that Marsalis, Maevyn P. ([id number]) surgery at this time. 04/21/18 on evaluation today patient presents for early follow-up she was supposed to be here in two days but nonetheless she is here sooner due to the fact that she's been having issues with her right wrap which is actually causing her a lot of discomfort. She states her leg has been more swollen and painful something she really has not experienced in recent weeks. Again this leg has been doing very well there's just a small blister that we were trading on the lateral aspect of her right leg. Again the left leg has pretty much completely resolved. Fortunately upon inspection today there does not appear to be any evidence of active infection systemically. With that being said I'm questioning whether or not she may have some cellulitis around the right lower extremity especially with the wrap somewhat pushed down and I think she may be scratching at this area. Readmission: 07/10/18 patient presents today for reevaluation  our clinic concerning issues that she has been having with her bilateral lower extremities with regard to open wounds/weeping. Right now she's having more than issue on the left posterior lower extremity although there is some weeping from the right posterior lower extremity as well. Fortunately there's no signs of active infection at this time which is good news. She really has not been using lymphedema pumps on a regular basis I do believe this would be of benefit for her. It also sounds like she has not been using the compression wraps that were Velcro that we got for her last time I think this likewise would also likely done better than just doing the ace wrap or whatnot when  occasionally her legs were wrapped. Either way I think right now that if we initiate compression wrapping we can likely get things back under control for her. She needs to have this under control so she can proceed with surgery for her right hip. 07/17/18 on evaluation today patient appears to be doing excellent in regard to her bilateral lower extremity. She's been tolerating the dressing changes without complication. Fortunately there's no signs of active infection at this time. In fact the reps seem to have done your job and honestly I do not see anything open today at all which is excellent news. 08/14/18 upon evaluation today patient's wounds actually appear to be doing significantly worse compared to what I last saw when I evaluated her one month ago. In the interim since that time and now it does not appear that she been wearing her Velcro compression wraps as directed on a daily basis. She tells me that she maybe wears them every other day although she's using her compression pumps every day. Nonetheless she has significant swelling as well as multiple areas of ulceration and skin opening which is not good. She's trying to keep her legs under control so that she can have her hip surgery. With that being said they're not gonna would likely want to do the surgery if she has open wounds which is why we're trying to get these healed. That's not gonna happen if she doesn't wear compression. This was discussed with her in great detail today. 08/21/18 on evaluation today patient appears to be doing a little better in regard to her bilateral lower extremities although she only kept the wraps on from Friday until Monday. Even prior to that she Artie been snipping the tops to try to release some of the pressure. Nonetheless in the end she ended up with having to remove the wraps come Monday and unfortunately this has led to more swelling and again her legs don't look as well as I would've liked.  Fortunately there's no signs of infection at this time. She did have her preop at [facility] and they told her that if her legs didn't look better than they do right now that she would not be able to proceed with the surgery. 08/28/2018 on evaluation today patient actually appears to be doing significantly better in general with regard to her bilateral lower extremity ulcers. She has been tolerating the dressing changes without complication. The good news is most of the wounds have actually healed up and seem to be doing quite well her swelling is also dramatically improved. Overall the main area that is still open on her lower extremities is really just her left dorsal foot the other areas are just small weeping areas that are drying out. In regard to her finger this likewise is  seems to be doing very well today Patient History Information obtained from Patient. Social History Never smoker, Marital Status - Married, Alcohol Use - Never, Drug Use - No History, Caffeine Use - Never. Medical History Eyes Patient has history of Cataracts - removed Denies history of Glaucoma, Optic Neuritis Ear/Nose/Mouth/Throat Theresa [name], BIALAS. ([id number]) Denies history of Chronic sinus problems/congestion, Middle ear problems Hematologic/Lymphatic Denies history of Anemia, Hemophilia, Human Immunodeficiency Virus, Lymphedema, Sickle Cell Disease Respiratory Denies history of Aspiration, Asthma, Chronic Obstructive Pulmonary Disease (COPD), Pneumothorax, Sleep Apnea, Tuberculosis Cardiovascular Patient has history of Angina, Coronary Artery Disease, Hypertension, Myocardial Infarction - more than 5 years ago, Peripheral Venous Disease Denies history of Arrhythmia, Congestive Heart Failure, Deep Vein Thrombosis, Hypotension, Peripheral Arterial Disease, Phlebitis, Vasculitis Gastrointestinal Denies history of Cirrhosis , Colitis, Crohn s, Hepatitis A, Hepatitis B, Hepatitis C Endocrine Patient has history of  Type II Diabetes Denies history of Type I Diabetes Genitourinary Denies history of End Stage Renal Disease Immunological Denies history of Lupus Erythematosus, Raynaud s, Scleroderma Integumentary (Skin) Denies history of History of Burn, History of pressure wounds Musculoskeletal Patient has history of Osteoarthritis Denies history of Gout, Rheumatoid Arthritis, Osteomyelitis Neurologic Patient has history of Neuropathy Denies history of Dementia, Quadriplegia, Paraplegia, Seizure Disorder Oncologic Patient has history of Received Chemotherapy, Received Radiation Psychiatric Denies history of Anorexia/bulimia, Confinement Anxiety Medical And Surgical History Notes Genitourinary CKD stage 3 Oncologic 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 Respiratory Denies complaints or symptoms of Chronic or frequent coughs, Shortness of Breath. Cardiovascular Denies complaints or symptoms of Chest pain, LE edema. Psychiatric Denies complaints or symptoms of Anxiety, Claustrophobia. Objective Theresa [name], [name] ([id number]) Constitutional Well-nourished and well-hydrated in no acute distress. Vitals Time Taken: 8:20 AM, Height: 67 in, Weight: 216 lbs, BMI: 33.8, Temperature: 97.9 F, Pulse: 82 bpm, Respiratory Rate: 18 breaths/min, Blood Pressure: 144/53 mmHg. Respiratory normal breathing without difficulty. clear to auscultation bilaterally. Cardiovascular regular rate and rhythm with normal S1, S2. Psychiatric this patient is able to make decisions and demonstrates good insight into disease process. Alert and Oriented x 3. pleasant and cooperative. General Notes: At this time patient does have an open wound on the dorsal surface of her foot she also has her finger ulcer which is still open. The finger did require some  sharp debridement to remove callus and slough from the surface of the wound post debridement this appears to be doing much better is healing quite nicely. No debridement was required on the lower extremities at this time although compression therapy will be recommended to continue. Integumentary (Hair, Skin) Wound #1 status is Healed - Epithelialized. Original cause of wound was Gradually Appeared. The wound is located on the Left,Medial Malleolus. The wound measures 0cm length x 0cm width x 0cm depth; 0cm^2 area and 0cm^3 volume. Wound #6 status is Healed - Epithelialized. Original cause of wound was Gradually Appeared. The wound is located on the Left,Posterior Lower Leg. The wound measures 0cm length x 0cm width x 0cm depth; 0cm^2 area and 0cm^3 volume. Wound #7 status is Open. Original cause of wound was Gradually Appeared. The wound is located on the Left,Dorsal Foot. The wound measures 0.5cm length x 0.4cm width x 0.1cm depth; 0.157cm^2 area and 0.016cm^3 volume. There is Fat Layer (Subcutaneous Tissue) Exposed exposed. There is no tunneling or undermining noted. There is a medium amount of serous drainage  noted. The wound margin is flat and intact. There is large (67-100%) pink granulation within the wound bed. There is no necrotic tissue within the wound bed. Wound #8 status is Healed - Epithelialized. Original cause of wound was Gradually Appeared. The wound is located on the Right,Anterior Lower Leg. The wound measures 0cm length x 0cm width x 0cm depth; 0cm^2 area and 0cm^3 volume. Wound #9 status is Open. Original cause of wound was Trauma. The wound is located on the Right Hand - 2nd Digit. The wound measures 0.3cm length x 0.2cm width x 0.1cm depth; 0.047cm^2 area and 0.005cm^3 volume. There is Fat Layer (Subcutaneous Tissue) Exposed exposed. There is no tunneling or undermining noted. There is a none present amount of drainage noted. The wound margin is thickened. There is large  (67-100%) pink granulation within the wound bed. There is no necrotic tissue within the wound bed. 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 ([id number]) Non-pressure chronic ulcer of skin 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 Essential (primary) hypertension Procedures Wound #9 Pre-procedure diagnosis of Wound #9 is a Trauma, Other located on the Right Hand - 2nd Digit . There was a Excisional Skin/Subcutaneous Tissue Debridement with a total area of 0.06 sq cm performed by STONE III, HOYT E., PA-C. With the following instrument(s): Curette to remove Viable and Non-Viable tissue/material. Material removed includes Callus, Subcutaneous Tissue, Slough, Skin: Dermis, and Skin: Epidermis after achieving pain control using Lidocaine 4% Topical Solution. No specimens were taken. A time out was conducted at 08:58, prior to the start of the procedure. A Minimum amount of bleeding was controlled with Pressure. The procedure was tolerated well with a pain level of 0 throughout and a pain level of 0 following the procedure. Post Debridement Measurements: 0.3cm length x 0.2cm width x 0.2cm depth; 0.009cm^3 volume. Character of Wound/Ulcer Post Debridement is improved. Post procedure Diagnosis Wound #9: Same as Pre-Procedure Plan Wound Cleansing: Wound #7 Left,Dorsal Foot: May shower with protection. - DO NOT GET YOUR WRAPS WET Wound #9 Right Hand - 2nd Digit: May shower with protection. - DO NOT GET YOUR WRAPS WET Primary Wound Dressing: Wound #7 Left,Dorsal Foot: Silver Alginate Wound #9 Right Hand - 2nd Digit: Santyl Ointment Secondary Dressing: Wound #7 Left,Dorsal Foot: ABD  pad Wound #9 Right Hand - 2nd Digit: Dry Gauze Other - coverlet Dressing Change Frequency: Wound #7 Left,Dorsal Foot: Change dressing every week Wound #9 Right Hand - 2nd Digit: Change dressing every day. Follow-up Appointments: Return Appointment in 1 week. Edema Control: Wound #7 Left,Dorsal Foot: Kerlix and Coban - Bilateral Elevate legs to the level of the heart and pump ankles as often as possible Theresa [name], Theresa [name] P. ([id number]) 1. I am going to recommend that we continue compression therapy and my suggestion is good to be that we do this until her surgery time which is actually 3 weeks away. We need to keep all of this under control until she can have her surgery in order to allow her to actually be able to undergo the surgery. With that being said we are going to switch from a 3 layer compression wrap to Kerlix and Coban. This will be a little bit looser hopefully will not cause her as  much problems. 2. With regard to her finger ulcer I would recommend that we continue the Santyl for the time being. She is in agreement with that plan as well. 3. I suggest that she continue to use her lymphedema pumps bilaterally this will help keep the fluid down especially with decreasing the amount of compression were using with the compression wrap she needs to be extra diligent with her pumping. We will see patient back for reevaluation in 1 week here in the clinic. If anything worsens or changes patient will contact our office for additional recommendations. Electronic Signature(s) Signed: 08/28/2018 9:30:54 AM By: Worthy Keeler PA-C Entered By: Worthy Keeler on 08/28/2018 09:30:53 Porcher, Theresa [name] ([id number]) -------------------------------------------------------------------------------- ROS/PFSH Details Patient Name: Bleich, Letishia P. Date of Service: 08/28/2018 8:15 AM Medical Record Number: [id number] Patient Account Number: [id number] Date of Birth/Sex: 02/17/1940 (78 y.o.  F) Treating RN: Montey Hora Primary Care Provider: Johny Drilling Other Clinician: Referring Provider: Johny Drilling Treating Provider/Extender: Melburn Hake, HOYT Weeks in Treatment: 7 Information Obtained From Patient Constitutional Symptoms (General Health) Complaints and Symptoms: Negative for: Fatigue; Fever; Chills; Marked Weight Change Respiratory Complaints and Symptoms: Negative for: Chronic or frequent coughs; Shortness of Breath Medical History: Negative for: Aspiration; Asthma; Chronic Obstructive Pulmonary Disease (COPD); Pneumothorax; Sleep Apnea; Tuberculosis Cardiovascular Complaints and Symptoms: Negative for: Chest pain; LE edema Medical History: Positive for: Angina; Coronary Artery Disease; Hypertension; Myocardial Infarction - more than 5 years ago; Peripheral Venous Disease Negative for: Arrhythmia; Congestive Heart Failure; Deep Vein Thrombosis; Hypotension; Peripheral Arterial Disease; Phlebitis; Vasculitis Psychiatric Complaints and Symptoms: Negative for: Anxiety; Claustrophobia Medical History: Negative for: Anorexia/bulimia; Confinement Anxiety Eyes Medical History: Positive for: Cataracts - removed Negative for: Glaucoma; Optic Neuritis Ear/Nose/Mouth/Throat Medical History: Negative for: Chronic sinus problems/congestion; Middle ear problems Hematologic/Lymphatic RASHELLE, IRELAND. ([id number]) Medical History: Negative for: Anemia; Hemophilia; Human Immunodeficiency Virus; Lymphedema; Sickle Cell Disease Gastrointestinal Medical History: Negative for: Cirrhosis ; Colitis; Crohnos; Hepatitis A; Hepatitis B; Hepatitis C Endocrine Medical History: Positive for: Type II Diabetes Negative for: Type I Diabetes Treated with: Insulin Blood sugar tested every day: Yes Tested : Blood sugar testing results: Breakfast: 211; Lunch: 240 Genitourinary Medical History: Negative for: End Stage Renal Disease Past Medical History Notes: CKD stage  3 Immunological Medical History: Negative for: Lupus Erythematosus; Raynaudos; Scleroderma Integumentary (Skin) Medical History: Negative for: History of Burn; History of pressure wounds Musculoskeletal Medical History: Positive for: Osteoarthritis Negative for: Gout; Rheumatoid Arthritis; Osteomyelitis Neurologic Medical History: Positive for: Neuropathy Negative for: Dementia; Quadriplegia; Paraplegia; Seizure Disorder Oncologic Medical History: Positive for: Received Chemotherapy; Received Radiation Past Medical History Notes: 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 ([id number]) Immunizations Pneumococcal Vaccine: Received Pneumococcal Vaccination: Yes Implantable Devices No devices added Family and Social History Never smoker; Marital Status - Married; Alcohol Use: Never; Drug Use: No History; Caffeine Use: Never; Financial Concerns: No; Food, Clothing or Shelter Needs: No; Support System Lacking: No; Transportation Concerns: No Physician Affirmation I have reviewed and agree with the above information. Electronic Signature(s) Signed: 08/28/2018 4:33:47 PM By: Montey Hora Signed: 08/30/2018 6:04:32 PM By: Worthy Keeler PA-C Entered By: Worthy Keeler on 08/28/2018 09:29:03 Vallin, Seville P. ([id number]) -------------------------------------------------------------------------------- SuperBill Details Patient Name: Martes, Sura P. Date of Service: 08/28/2018 Medical Record Number: [id number] Patient Account Number: [id number] Date of Birth/Sex: 1940/09/03 (78 y.o. F) Treating RN: Montey Hora Primary Care Provider: Johny Drilling Other Clinician: Referring Provider: Johny Drilling Treating  Provider/Extender: Melburn Hake, HOYT Weeks in Treatment: 7 Diagnosis Coding ICD-10 Codes Code Description E11.622 Type 2 diabetes mellitus with other skin ulcer I87.2 Venous insufficiency  (chronic) (peripheral) L97.822 Non-pressure chronic ulcer of other part of left lower leg with fat layer exposed L97.522 Non-pressure chronic ulcer of other part of left foot with fat layer exposed S61.401A Unspecified open wound of right hand, initial encounter L98.492 Non-pressure chronic ulcer of skin 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 I10 Essential (primary) hypertension Facility Procedures CPT4 Code: [id number] Description: 75916 - DEB SUBQ TISSUE 20 SQ CM/< ICD-10 Diagnosis Description L98.492 Non-pressure chronic ulcer of skin of other sites with fat la Modifier: yer exposed Quantity: 1 Physician Procedures CPT4 Code Description: [id number] 99214 - WC PHYS LEVEL 4 - EST PT ICD-10 Diagnosis Description E11.622 Type 2 diabetes mellitus with other skin ulcer I87.2 Venous insufficiency (chronic) (peripheral) L97.822 Non-pressure chronic ulcer of other part of left  lower leg with L97.522 Non-pressure chronic ulcer of other part of left foot with fat Modifier: 25 fat layer expos layer exposed Quantity: 1 ed CPT4 Code Description: 9357017 11042 - WC PHYS SUBQ TISS 20 SQ CM ICD-10 Diagnosis Description L98.492 Non-pressure chronic ulcer of skin of other sites with fat laye Modifier: r exposed Quantity: 1 Electronic Signature(s) Signed: 08/28/2018 9:31:25 AM By: Worthy Keeler PA-C Entered By: Worthy Keeler on 08/28/2018 09:31:24

## 2018-09-04 ENCOUNTER — Other Ambulatory Visit: Payer: Self-pay

## 2018-09-04 ENCOUNTER — Encounter: Payer: Medicare HMO | Attending: Physician Assistant | Admitting: Physician Assistant

## 2018-09-04 DIAGNOSIS — Z8542 Personal history of malignant neoplasm of other parts of uterus: Secondary | ICD-10-CM | POA: Diagnosis not present

## 2018-09-04 DIAGNOSIS — E1136 Type 2 diabetes mellitus with diabetic cataract: Secondary | ICD-10-CM | POA: Insufficient documentation

## 2018-09-04 DIAGNOSIS — I25119 Atherosclerotic heart disease of native coronary artery with unspecified angina pectoris: Secondary | ICD-10-CM | POA: Insufficient documentation

## 2018-09-04 DIAGNOSIS — E11621 Type 2 diabetes mellitus with foot ulcer: Secondary | ICD-10-CM | POA: Diagnosis not present

## 2018-09-04 DIAGNOSIS — Z923 Personal history of irradiation: Secondary | ICD-10-CM | POA: Insufficient documentation

## 2018-09-04 DIAGNOSIS — L97522 Non-pressure chronic ulcer of other part of left foot with fat layer exposed: Secondary | ICD-10-CM | POA: Insufficient documentation

## 2018-09-04 DIAGNOSIS — I252 Old myocardial infarction: Secondary | ICD-10-CM | POA: Diagnosis not present

## 2018-09-04 DIAGNOSIS — Z885 Allergy status to narcotic agent status: Secondary | ICD-10-CM | POA: Diagnosis not present

## 2018-09-04 DIAGNOSIS — I129 Hypertensive chronic kidney disease with stage 1 through stage 4 chronic kidney disease, or unspecified chronic kidney disease: Secondary | ICD-10-CM | POA: Diagnosis not present

## 2018-09-04 DIAGNOSIS — E1151 Type 2 diabetes mellitus with diabetic peripheral angiopathy without gangrene: Secondary | ICD-10-CM | POA: Insufficient documentation

## 2018-09-04 DIAGNOSIS — E1142 Type 2 diabetes mellitus with diabetic polyneuropathy: Secondary | ICD-10-CM | POA: Insufficient documentation

## 2018-09-04 DIAGNOSIS — M1990 Unspecified osteoarthritis, unspecified site: Secondary | ICD-10-CM | POA: Diagnosis not present

## 2018-09-04 DIAGNOSIS — N183 Chronic kidney disease, stage 3 (moderate): Secondary | ICD-10-CM | POA: Insufficient documentation

## 2018-09-04 DIAGNOSIS — L98492 Non-pressure chronic ulcer of skin 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: Secondary | ICD-10-CM | POA: Diagnosis not present

## 2018-09-04 DIAGNOSIS — Z881 Allergy status to other antibiotic agents status: Secondary | ICD-10-CM | POA: Insufficient documentation

## 2018-09-04 DIAGNOSIS — L97822 Non-pressure chronic ulcer of other part of left lower leg with fat layer exposed: Secondary | ICD-10-CM | POA: Diagnosis not present

## 2018-09-04 DIAGNOSIS — Z853 Personal history of malignant neoplasm of breast: Secondary | ICD-10-CM | POA: Insufficient documentation

## 2018-09-04 DIAGNOSIS — I890 Lymphedema, not elsewhere classified: Secondary | ICD-10-CM | POA: Diagnosis not present

## 2018-09-04 DIAGNOSIS — E669 Obesity, unspecified: Secondary | ICD-10-CM | POA: Insufficient documentation

## 2018-09-04 DIAGNOSIS — E1122 Type 2 diabetes mellitus with diabetic chronic kidney disease: Secondary | ICD-10-CM | POA: Diagnosis not present

## 2018-09-04 NOTE — Progress Notes (Signed)
ARMENTHA, BRANAGAN ([id number]) Visit Report for 09/04/2018 Arrival Information Details Patient Name: SHEENA, DONEGAN. Date of Service: 09/04/2018 8:15 AM Medical Record Number: [id number] Patient Account Number: [id number] Date of Birth/Sex: 04/24/1940 (78 y.o. F) Treating RN: Cornell Barman Primary Care Makaiya Geerdes: Johny Drilling Other Clinician: Referring Keelyn Monjaras: Johny Drilling Treating Cisco Kindt/Extender: Melburn Hake, HOYT Weeks in Treatment: 8 Visit Information History Since Last Visit Added or deleted any medications: No Patient Arrived: Wheel Chair Any new allergies or adverse reactions: No Arrival Time: 08:15 Had a fall or experienced change in No Accompanied By: daughter activities of daily living that may affect Transfer Assistance: Manual risk of falls: Patient Identification Verified: Yes Signs or symptoms of abuse/neglect since last visito No Secondary Verification Process Yes Hospitalized since last visit: No Completed: Implantable device outside of the clinic excluding No Patient Has Alerts: Yes cellular tissue based products placed in the center Patient Alerts: DMII since last visit: ABI 03/06/18 L 1.28 R Has Dressing in Place as Prescribed: Yes 1.22 Pain Present Now: Yes Electronic Signature(s) Signed: 09/04/2018 5:41:45 PM By: Gretta Cool, BSN, RN, CWS, Kim RN, BSN Entered By: Gretta Cool, BSN, RN, CWS, Kim on 09/04/2018 08:36:15 Weseman, Fredna Dow ([id number]) -------------------------------------------------------------------------------- Clinic Level of Care Assessment Details Patient Name: Erman, Maloree P. Date of Service: 09/04/2018 8:15 AM Medical Record Number: [id number] Patient Account Number: [id number] Date of Birth/Sex: Jan 09, 1941 (78 y.o. F) Treating RN: Cornell Barman Primary Care Andilynn Delavega: Johny Drilling Other Clinician: Referring Ciin Brazzel: Johny Drilling Treating Rodgers Likes/Extender: Melburn Hake, HOYT Weeks in Treatment: 8 Clinic Level of Care Assessment Items TOOL 4  Quantity Score []  - Use when only an EandM is performed on FOLLOW-UP visit 0 ASSESSMENTS - Nursing Assessment / Reassessment X - Reassessment of Co-morbidities (includes updates in patient status) 1 10 X- 1 5 Reassessment of Adherence to Treatment Plan ASSESSMENTS - Wound and Skin Assessment / Reassessment X - Simple Wound Assessment / Reassessment - one wound 1 5 []  - 0 Complex Wound Assessment / Reassessment - multiple wounds []  - 0 Dermatologic / Skin Assessment (not related to wound area) ASSESSMENTS - Focused Assessment []  - Circumferential Edema Measurements - multi extremities 0 []  - 0 Nutritional Assessment / Counseling / Intervention []  - 0 Lower Extremity Assessment (monofilament, tuning fork, pulses) []  - 0 Peripheral Arterial Disease Assessment (using hand held doppler) ASSESSMENTS - Ostomy and/or Continence Assessment and Care []  - Incontinence Assessment and Management 0 []  - 0 Ostomy Care Assessment and Management (repouching, etc.) PROCESS - Coordination of Care X - Simple Patient / Family Education for ongoing care 1 15 []  - 0 Complex (extensive) Patient / Family Education for ongoing care []  - 0 Staff obtains Programmer, systems, Records, Test Results / Process Orders []  - 0 Staff telephones HHA, Nursing Homes / Clarify orders / etc []  - 0 Routine Transfer to another Facility (non-emergent condition) []  - 0 Routine Hospital Admission (non-emergent condition) []  - 0 New Admissions / Biomedical engineer / Ordering NPWT, Apligraf, etc. []  - 0 Emergency Hospital Admission (emergent condition) X- 1 10 Simple Discharge Coordination Medders, Delberta P. ([id number]) []  - 0 Complex (extensive) Discharge Coordination PROCESS - Special Needs []  - Pediatric / Minor Patient Management 0 []  - 0 Isolation Patient Management []  - 0 Hearing / Language / Visual special needs []  - 0 Assessment of Community assistance (transportation, D/C planning, etc.) []  - 0 Additional  assistance / Altered mentation []  - 0 Support Surface(s) Assessment (bed, cushion, seat, etc.) INTERVENTIONS - Wound Cleansing / Measurement X -  Simple Wound Cleansing - one wound 1 5 []  - 0 Complex Wound Cleansing - multiple wounds X- 1 5 Wound Imaging (photographs - any number of wounds) []  - 0 Wound Tracing (instead of photographs) X- 1 5 Simple Wound Measurement - one wound []  - 0 Complex Wound Measurement - multiple wounds INTERVENTIONS - Wound Dressings []  - Small Wound Dressing one or multiple wounds 0 []  - 0 Medium Wound Dressing one or multiple wounds X- 1 20 Large Wound Dressing one or multiple wounds []  - 0 Application of Medications - topical []  - 0 Application of Medications - injection INTERVENTIONS - Miscellaneous []  - External ear exam 0 []  - 0 Specimen Collection (cultures, biopsies, blood, body fluids, etc.) []  - 0 Specimen(s) / Culture(s) sent or taken to Lab for analysis []  - 0 Patient Transfer (multiple staff / Civil Service fast streamer / Similar devices) []  - 0 Simple Staple / Suture removal (25 or less) []  - 0 Complex Staple / Suture removal (26 or more) []  - 0 Hypo / Hyperglycemic Management (close monitor of Blood Glucose) []  - 0 Ankle / Brachial Index (ABI) - do not check if billed separately X- 1 5 Vital Signs Wisler, Mylah P. ([id number]) Has the patient been seen at the hospital within the last three years: Yes Total Score: 85 Level Of Care: New/Established - Level 3 Electronic Signature(s) Signed: 09/04/2018 5:41:45 PM By: Gretta Cool, BSN, RN, CWS, Kim RN, BSN Entered By: Gretta Cool, BSN, RN, CWS, Kim on 09/04/2018 08:42:49 Shatzer, Fredna Dow ([id number]) -------------------------------------------------------------------------------- Encounter Discharge Information Details Patient Name: Angeletti, Payten P. Date of Service: 09/04/2018 8:15 AM Medical Record Number: [id number] Patient Account Number: [id number] Date of Birth/Sex: [birth year] (78 y.o. F) Treating  RN: Cornell Barman Primary Care Agam Davenport: Johny Drilling Other Clinician: Referring Minard Millirons: Johny Drilling Treating Fleet Higham/Extender: Melburn Hake, HOYT Weeks in Treatment: 8 Encounter Discharge Information Items Discharge Condition: Stable Ambulatory Status: Wheelchair Discharge Destination: Home Transportation: Private Auto Accompanied By: daughter Schedule Follow-up Appointment: Yes Clinical Summary of Care: Electronic Signature(s) Signed: 09/04/2018 5:41:45 PM By: Gretta Cool, BSN, RN, CWS, Kim RN, BSN Entered By: Gretta Cool, BSN, RN, CWS, Kim on 09/04/2018 08:44:33 Mizuno, Fredna Dow ([id number]) -------------------------------------------------------------------------------- Lower Extremity Assessment Details Patient Name: Vanputten, Nimra P. Date of Service: 09/04/2018 8:15 AM Medical Record Number: [id number] Patient Account Number: [id number] Date of Birth/Sex: 1940/04/19 (78 y.o. F) Treating RN: Army Melia Primary Care Aneeka Bowden: Johny Drilling Other Clinician: Referring Kamori Barbier: Johny Drilling Treating Rylinn Linzy/Extender: Melburn Hake, HOYT Weeks in Treatment: 8 Edema Assessment Assessed: [Left: No] [Right: No] Edema: [Left: No] [Right: No] Vascular Assessment Pulses: Dorsalis Pedis Palpable: [Left:Yes] [Right:Yes] Electronic Signature(s) Signed: 09/04/2018 11:48:59 AM By: Army Melia Entered By: Army Melia on 09/04/2018 08:32:19 Cerami, Loda P. ([id number]) -------------------------------------------------------------------------------- Multi Wound Chart Details Patient Name: Neis, Peniel P. Date of Service: 09/04/2018 8:15 AM Medical Record Number: [id number] Patient Account Number: [id number] Date of Birth/Sex: January 23, 1941 (78 y.o. F) Treating RN: Cornell Barman Primary Care Bentleigh Stankus: Johny Drilling Other Clinician: Referring Jayma Volpi: Johny Drilling Treating Letonia Stead/Extender: Melburn Hake, HOYT Weeks in Treatment: 8 Vital Signs Height(in): 40 Pulse(bpm): 76 Weight(lbs): 216 Blood  Pressure(mmHg): 158/66 Body Mass Index(BMI): 34 Temperature(F): 97.8 Respiratory Rate 18 (breaths/min): Photos: Wound Location: Left Foot - Dorsal Right Lower Leg - Anterior Right Hand - 2nd Digit Wounding Event: Gradually Appeared Gradually Appeared Trauma Primary Etiology: Diabetic Wound/Ulcer of the Diabetic Wound/Ulcer of the Trauma, Other Lower Extremity Lower Extremity Comorbid History: Cataracts, Angina, Coronary Cataracts, Angina, Coronary Cataracts, Angina, Coronary Artery Disease, Hypertension, Artery Disease, Hypertension,  Artery Disease, Hypertension, Myocardial Infarction, Myocardial Infarction, Myocardial Infarction, Peripheral Venous Disease, Peripheral Venous Disease, Peripheral Venous Disease, Type II Diabetes, Type II Diabetes, Type II Diabetes, Osteoarthritis, Neuropathy, Osteoarthritis, Neuropathy, Osteoarthritis, Neuropathy, Received Chemotherapy, Received Chemotherapy, Received Chemotherapy, Received Radiation Received Radiation Received Radiation Date Acquired: 07/29/2018 07/29/2018 08/07/2018 Weeks of Treatment: 3 3 3  Wound Status: Open Open Open Measurements L x W x D 0.1x0.1x0.1 1.5x1.1x0.1 0.5x0.4x0.1 (cm) Area (cm) : 0.008 1.296 0.157 Volume (cm) : 0.001 0.13 0.016 % Reduction in Area: 99.80% 31.20% 80.00% % Reduction in Volume: 99.80% 30.90% 89.80% Classification: Grade 2 Grade 1 Full Thickness Without Exposed Support Structures Exudate Amount: Medium Medium None Present Exudate Type: Serous N/A N/A Exudate Color: amber N/A N/A Wound Margin: Flat and Intact N/A Thickened Granulation Amount: Large (67-100%) Medium (34-66%) None Present (0%) Granulation Quality: Pink Red N/A Meloni, Nohemi P. ([id number]) Necrotic Amount: None Present (0%) Medium (34-66%) Medium (34-66%) Exposed Structures: Fat Layer (Subcutaneous Fat Layer (Subcutaneous Fat Layer (Subcutaneous Tissue) Exposed: Yes Tissue) Exposed: Yes Tissue) Exposed: Yes Fascia: No Fascia:  No Fascia: No Tendon: No Tendon: No Tendon: No Muscle: No Muscle: No Muscle: No Joint: No Joint: No Joint: No Bone: No Bone: No Bone: No Epithelialization: None None Medium (34-66%) Treatment Notes Electronic Signature(s) Signed: 09/04/2018 5:41:45 PM By: Gretta Cool, BSN, RN, CWS, Kim RN, BSN Entered By: Gretta Cool, BSN, RN, CWS, Kim on 09/04/2018 08:36:33 Micco, Fredna Dow ([id number]) -------------------------------------------------------------------------------- Multi-Disciplinary Care Plan Details Patient Name: Oscar, Aneesah P. Date of Service: 09/04/2018 8:15 AM Medical Record Number: [id number] Patient Account Number: [id number] Date of Birth/Sex: 04-06-1940 (78 y.o. F) Treating RN: Cornell Barman Primary Care Ottilie Wigglesworth: Johny Drilling Other Clinician: Referring Sydelle Sherfield: Johny Drilling Treating Elyssia Strausser/Extender: Melburn Hake, HOYT Weeks in Treatment: 8 Active Inactive Abuse / Safety / Falls / Self Care Management Nursing Diagnoses: Impaired physical mobility Goals: Patient will not develop complications from immobility Date Initiated: 07/10/2018 Target Resolution Date: 11/27/2018 Goal Status: Active Interventions: Assess fall risk on admission and as needed Notes: Venous Leg Ulcer Nursing Diagnoses: Actual venous Insuffiency (use after diagnosis is confirmed) Goals: Patient will maintain optimal edema control Date Initiated: 07/10/2018 Target Resolution Date: 11/27/2018 Goal Status: Active Interventions: Compression as ordered Provide education on venous insufficiency Notes: Wound/Skin Impairment Nursing Diagnoses: Impaired tissue integrity Goals: Ulcer/skin breakdown will heal within 14 weeks Date Initiated: 07/10/2018 Target Resolution Date: 11/27/2018 Goal Status: Active Interventions: YARELLY, KUBA ([id number]) Assess patient/caregiver ability to obtain necessary supplies Assess patient/caregiver ability to perform ulcer/skin care regimen upon admission and as  needed Assess ulceration(s) every visit Notes: Electronic Signature(s) Signed: 09/04/2018 5:41:45 PM By: Gretta Cool, BSN, RN, CWS, Kim RN, BSN Entered By: Gretta Cool, BSN, RN, CWS, Kim on 09/04/2018 35:70:17 Minarik, Kona P. ([id number]) -------------------------------------------------------------------------------- Pain Assessment Details Patient Name: Steier, Blondell P. Date of Service: 09/04/2018 8:15 AM Medical Record Number: [id number] Patient Account Number: [id number] Date of Birth/Sex: 05/11/1940 (78 y.o. F) Treating RN: Cornell Barman Primary Care Rin Gorton: Johny Drilling Other Clinician: Referring Jeannemarie Sawaya: Johny Drilling Treating Leelyn Jasinski/Extender: Melburn Hake, HOYT Weeks in Treatment: 8 Active Problems Location of Pain Severity and Description of Pain Patient Has Paino Yes Site Locations Rate the pain. Current Pain Level: 10 Pain Management and Medication Current Pain Management: Electronic Signature(s) Signed: 09/04/2018 4:34:29 PM By: Lorine Bears RCP, RRT, CHT Signed: 09/04/2018 5:41:45 PM By: Gretta Cool, BSN, RN, CWS, Kim RN, BSN Entered By: Lorine Bears on 09/04/2018 08:21:21 Trixie Rude ([id number]) -------------------------------------------------------------------------------- Patient/Caregiver Education Details Patient Name: Laminack, Montez P. Date of Service:  09/04/2018 8:15 AM Medical Record Number: [id number] Patient Account Number: [id number] Date of Birth/Gender: [birth year] (78 y.o. F) Treating RN: Cornell Barman Primary Care Physician: Johny Drilling Other Clinician: Referring Physician: Johny Drilling Treating Physician/Extender: Sharalyn Ink in Treatment: 8 Education Assessment Education Provided To: Patient Education Topics Provided Wound/Skin Impairment: Handouts: Caring for Your Ulcer Methods: Demonstration, Explain/Verbal Responses: State content correctly Electronic Signature(s) Signed: 09/04/2018 5:41:45 PM By: Gretta Cool, BSN, RN,  CWS, Kim RN, BSN Entered By: Gretta Cool, BSN, RN, CWS, Kim on 09/04/2018 08:43:13 Abee, Fredna Dow ([id number]) -------------------------------------------------------------------------------- Wound Assessment Details Patient Name: Sarra, Lyndie P. Date of Service: 09/04/2018 8:15 AM Medical Record Number: [id number] Patient Account Number: [id number] Date of Birth/Sex: 01/10/1941 (78 y.o. F) Treating RN: Cornell Barman Primary Care Tomothy Eddins: Johny Drilling Other Clinician: Referring Ossie Beltran: Johny Drilling Treating Kadesha Virrueta/Extender: Melburn Hake, HOYT Weeks in Treatment: 8 Wound Status Wound Number: 7 Primary Diabetic Wound/Ulcer of the Lower Extremity Etiology: Wound Location: Left, Dorsal Foot Wound Healed - Epithelialized Wounding Event: Gradually Appeared Status: Date Acquired: 07/29/2018 Comorbid Cataracts, Angina, Coronary Artery Disease, Weeks Of Treatment: 3 History: Hypertension, Myocardial Infarction, Peripheral Clustered Wound: No Venous Disease, Type II Diabetes, Osteoarthritis, Neuropathy, Received Chemotherapy, Received Radiation Photos Wound Measurements Length: (cm) 0 % Reduction Width: (cm) 0 % Reduction Depth: (cm) 0 Epitheliali Area: (cm) 0 Tunneling: Volume: (cm) 0 Underminin in Area: 100% in Volume: 100% zation: None No g: No Wound Description Classification: Grade 2 Foul Odor A Wound Margin: Flat and Intact Slough/Fibr Exudate Amount: Medium Exudate Type: Serous Exudate Color: amber fter Cleansing: No ino No Wound Bed Granulation Amount: Large (67-100%) Exposed Structure Granulation Quality: Pink Fascia Exposed: No Necrotic Amount: None Present (0%) Fat Layer (Subcutaneous Tissue) Exposed: Yes Tendon Exposed: No Muscle Exposed: No Joint Exposed: No Bone Exposed: No Trinika, Cortese Wyona P. ([id number]) Electronic Signature(s) Signed: 09/04/2018 5:41:45 PM By: Gretta Cool, BSN, RN, CWS, Kim RN, BSN Entered By: Gretta Cool, BSN, RN, CWS, Kim on 09/04/2018  08:39:01 Crofford, Shann P. ([id number]) -------------------------------------------------------------------------------- Wound Assessment Details Patient Name: Rookstool, Analysse P. Date of Service: 09/04/2018 8:15 AM Medical Record Number: [id number] Patient Account Number: [id number] Date of Birth/Sex: 1940-10-22 (78 y.o. F) Treating RN: Army Melia Primary Care Vidhi Delellis: Johny Drilling Other Clinician: Referring Hasana Alcorta: Johny Drilling Treating Vin Yonke/Extender: Melburn Hake, HOYT Weeks in Treatment: 8 Wound Status Wound Number: 8 Primary Diabetic Wound/Ulcer of the Lower Extremity Etiology: Wound Location: Right Lower Leg - Anterior Wound Open Wounding Event: Gradually Appeared Status: Date Acquired: 07/29/2018 Comorbid Cataracts, Angina, Coronary Artery Disease, Weeks Of Treatment: 3 History: Hypertension, Myocardial Infarction, Peripheral Clustered Wound: No Venous Disease, Type II Diabetes, Osteoarthritis, Neuropathy, Received Chemotherapy, Received Radiation Photos Wound Measurements Length: (cm) 1.5 % Reduction in Width: (cm) 1.1 % Reduction in Depth: (cm) 0.1 Epithelializat Area: (cm) 1.296 Tunneling: Volume: (cm) 0.13 Undermining: Area: 31.2% Volume: 30.9% ion: None No No Wound Description Classification: Grade 1 Foul Odor Afte Exudate Amount: Medium Slough/Fibrino r Cleansing: No Yes Wound Bed Granulation Amount: Medium (34-66%) Exposed Structure Granulation Quality: Red Fascia Exposed: No Necrotic Amount: Medium (34-66%) Fat Layer (Subcutaneous Tissue) Exposed: Yes Necrotic Quality: Adherent Slough Tendon Exposed: No Muscle Exposed: No Joint Exposed: No Bone Exposed: No Treatment Notes Gillingham, Zariel P. ([id number]) Wound #8 (Right, Anterior Lower Leg) Notes S.Cell ABd Kerlix and Coban bilateral Electronic Signature(s) Signed: 09/04/2018 11:48:59 AM By: Army Melia Entered By: Army Melia on 09/04/2018 08:30:13 Yzaguirre, Helvi P.  ([id number]) -------------------------------------------------------------------------------- Wound Assessment Details Patient Name: Pech, Maralyn P. Date of Service: 09/04/2018 8:15 AM  Medical Record Number: [id number] Patient Account Number: [id number] Date of Birth/Sex: [birth year] (78 y.o. F) Treating RN: Cornell Barman Primary Care Nithin Demeo: Johny Drilling Other Clinician: Referring Sheryl Towell: Johny Drilling Treating Darrly Loberg/Extender: Melburn Hake, HOYT Weeks in Treatment: 8 Wound Status Wound Number: 9 Primary Trauma, Other Etiology: Wound Location: Right Hand - 2nd Digit Wound Healed - Epithelialized Wounding Event: Trauma Status: Date Acquired: 08/07/2018 Comorbid Cataracts, Angina, Coronary Artery Disease, Weeks Of Treatment: 3 History: Hypertension, Myocardial Infarction, Peripheral Clustered Wound: No Venous Disease, Type II Diabetes, Osteoarthritis, Neuropathy, Received Chemotherapy, Received Radiation Photos Wound Measurements Length: (cm) 0 % Reducti Width: (cm) 0 % Reducti Depth: (cm) 0 Epithelia Area: (cm) 0 Tunnelin Volume: (cm) 0 Undermin on in Area: 100% on in Volume: 100% lization: Medium (34-66%) g: No ing: No Wound Description Full Thickness Without Exposed Support Foul Odor Classification: Structures Slough/Fi Wound Margin: Thickened Exudate None Present Amount: After Cleansing: No brino Yes Wound Bed Granulation Amount: None Present (0%) Exposed Structure Necrotic Amount: Medium (34-66%) Fascia Exposed: No Fat Layer (Subcutaneous Tissue) Exposed: Yes Tendon Exposed: No Muscle Exposed: No Joint Exposed: No Bone Exposed: No VARVARA, LEGAULT ([id number]) Electronic Signature(s) Signed: 09/04/2018 5:41:45 PM By: Gretta Cool, BSN, RN, CWS, Kim RN, BSN Entered By: Gretta Cool, BSN, RN, CWS, Kim on 09/04/2018 08:39:01 Scheib, Coco P. ([id number]) -------------------------------------------------------------------------------- Vitals Details Patient Name:  Common, Nandana P. Date of Service: 09/04/2018 8:15 AM Medical Record Number: [id number] Patient Account Number: [id number] Date of Birth/Sex: 13-Dec-1940 (78 y.o. F) Treating RN: Cornell Barman Primary Care Lethia Donlon: Johny Drilling Other Clinician: Referring Kinga Cassar: Johny Drilling Treating Annaliesa Blann/Extender: Melburn Hake, HOYT Weeks in Treatment: 8 Vital Signs Time Taken: 08:15 Temperature (F): 97.8 Height (in): 67 Pulse (bpm): 86 Weight (lbs): 216 Respiratory Rate (breaths/min): 18 Body Mass Index (BMI): 33.8 Blood Pressure (mmHg): 158/66 Reference Range: 80 - 120 mg / dl Electronic Signature(s) Signed: 09/04/2018 4:34:29 PM By: Lorine Bears RCP, RRT, CHT Entered By: Lorine Bears on 09/04/2018 08:21:46

## 2018-09-04 NOTE — Progress Notes (Addendum)
GIRLIE, VELTRI ([id number]) Visit Report for 09/04/2018 Chief Complaint Document Details Patient Name: Theresa [name], Theresa [name]. Date of Service: 09/04/2018 8:15 AM Medical Record Number: [id number] Patient Account Number: [id number] Date of Birth/Sex: 03-11-1940 (78 y.o. F) Treating RN: Cornell Barman Primary Care Provider: Johny Drilling Other Clinician: Referring Provider: Johny Drilling Treating Provider/Extender: Melburn Hake, Aadvika Konen Weeks in Treatment: 8 Information Obtained from: Patient Chief Complaint Left LE ulcers and right hand ulcer Electronic Signature(s) Signed: 09/04/2018 8:34:48 AM By: Worthy Keeler PA-C Entered By: Worthy Keeler on 09/04/2018 08:34:48 Traxler, [name] ([id number]) -------------------------------------------------------------------------------- HPI Details Patient Name: [name], [name] Date of Service: 09/04/2018 8:15 AM Medical Record Number: [id number] Patient Account Number: [id number] Date of Birth/Sex: 11-22-1940 (78 y.o. F) Treating RN: Cornell Barman Primary Care Provider: Johny Drilling Other Clinician: Referring Provider: Johny Drilling Treating Provider/Extender: Melburn Hake, Athziri Freundlich Weeks in Treatment: 8 History of Present Illness HPI Description: 03/09/18 on evaluation today patient presents for initial inspection in our clinic concerning issues that she has been having with bilateral lower extremity ulcers. She has lymphedema as well as chronic venous stasis. She has seen Dr. dew where she's undergone ABI testing with a left ABI 1.28 a right ABI 1.22. Subsequently she is supposed to be undergoing venous studies as well which are upcoming. She does have a history of diabetes, chronic venous stasis, peripheral vascular disease, and hypertension. Currently these wounds which are currently draining are stated to have been open for roughly 2 weeks or so. It does not appear to be any evidence of infection. No fevers, chills, nausea, or vomiting noted at this time. With  that being said the patient does have some prominence to the ankle medially especially on the left side and I'm not sure if something was rubbing which calls this issue either. She is unable to get around and move very well simply due to the fact that she has an issue with her right hip as well which unfortunately prevents her from being able to move around very much. She did have an Ranier placed at the request of her daughter when she was at vascular this seems to may be potentially helped a little bit. 03/16/18 on evaluation today patient's right lower extremity appears to likely be completely healed although there still small area of concern which I'm gonna continue to watch. The really does not appear to be any weeping at this point which is good news. Overall I feel like she is done excellent. In regard to left lower extremity this is a little larger still than the right although again the swelling is indeed down. The wound over the media malleolus does appear to be doing better which is good news. No fevers, chills, nausea, or vomiting noted at this time. Unfortunately the patient still continues to have right hip pain which is getting more severe. Again her surgeon did not want to undergo surgery until everything was healed as far as the wounds of the right lower extremity. 03/23/18 on evaluation today patient actually appears to be doing excellent in regard to her right lower extremity. There are no open wounds at this point in time and there is no weeping of drainage the compression wraps appear to have done very well for her. She did get the FarrowWrap 4000 Velcro compression wraps which we are gonna initiate treatment with today and this should help continue to keep swelling under control as well is the weeping. Again everything appears to be doing great in regard  to the right lower extremity. In regard to left lower extremity everything is pretty much close there are no  significant open wounds the only actual open wound that she has is on the medial left ankle which again is the opposite side from where her hip issue is and this is very superficial and shows no signs of infection at this point. 04/02/18 on evaluation today patient actually presents for an early appointment after I saw her last week due to several areas the left lower extremity which had reopened causing some issues at this point. Fortunately there does not appear to be any evidence of infection at this time. With that being said she again is waiting on everything to be better in order to be able to proceed with the surgery for her right hip as previously suggested. 04/09/18 on evaluation today patient actually appears to be doing better in regard to her left lower surety ulcers which is great news. Unfortunately she continues to have issues even on the right side there are no open wounds that she does have a blister that arose on the right at this point as well. Fortunately there's no evidence of infection currently. No fevers, chills, nausea, or vomiting noted at this time. 04/16/18 on evaluation today patient actually appears to be doing very well in regard to her left lower extremity all the wounds appear to be completely healed including the left medial malleolus which was of greatest concern. The only if and when she has some regard to the right lower extremity where there is a very superficial area of blistering which is open at this point. Fortunately there's no signs of active infection otherwise. Overall I feel like she would be very safe to proceed with that surgery at this time. 04/21/18 on evaluation today patient presents for early follow-up she was supposed to be here in two days but nonetheless she is here sooner due to the fact that she's been having issues with her right wrap which is actually causing her a lot of discomfort. She states her leg has been more swollen and painful  something she really has not experienced in recent weeks. [name], Theresa [name] [name] ([id number]) Again this leg has been doing very well there's just a small blister that we were trading on the lateral aspect of her right leg. Again the left leg has pretty much completely resolved. Fortunately upon inspection today there does not appear to be any evidence of active infection systemically. With that being said I'm questioning whether or not she may have some cellulitis around the right lower extremity especially with the wrap somewhat pushed down and I think she may be scratching at this area. Readmission: 07/10/18 patient presents today for reevaluation our clinic concerning issues that she has been having with her bilateral lower extremities with regard to open wounds/weeping. Right now she's having more than issue on the left posterior lower extremity although there is some weeping from the right posterior lower extremity as well. Fortunately there's no signs of active infection at this time which is good news. She really has not been using lymphedema pumps on a regular basis I do believe this would be of benefit for her. It also sounds like she has not been using the compression wraps that were Velcro that we got for her last time I think this likewise would also likely done better than just doing the ace wrap or whatnot when occasionally her legs were wrapped. Either way I think right now that  if we initiate compression wrapping we can likely get things back under control for her. She needs to have this under control so she can proceed with surgery for her right hip. 07/17/18 on evaluation today patient appears to be doing excellent in regard to her bilateral lower extremity. She's been tolerating the dressing changes without complication. Fortunately there's no signs of active infection at this time. In fact the reps seem to have done your job and honestly I do not see anything open today at all which  is excellent news. 08/14/18 upon evaluation today patient's wounds actually appear to be doing significantly worse compared to what I last saw when I evaluated her one month ago. In the interim since that time and now it does not appear that she been wearing her Velcro compression wraps as directed on a daily basis. She tells me that she maybe wears them every other day although she's using her compression pumps every day. Nonetheless she has significant swelling as well as multiple areas of ulceration and skin opening which is not good. She's trying to keep her legs under control so that she can have her hip surgery. With that being said they're not gonna would likely want to do the surgery if she has open wounds which is why we're trying to get these healed. That's not gonna happen if she doesn't wear compression. This was discussed with her in great detail today. 08/21/18 on evaluation today patient appears to be doing a little better in regard to her bilateral lower extremities although she only kept the wraps on from Friday until Monday. Even prior to that she Artie been snipping the tops to try to release some of the pressure. Nonetheless in the end she ended up with having to remove the wraps come Monday and unfortunately this has led to more swelling and again her legs don't look as well as I would've liked. Fortunately there's no signs of infection at this time. She did have her preop at [facility] and they told her that if her legs didn't look better than they do right now that she would not be able to proceed with the surgery. 08/28/2018 on evaluation today patient actually appears to be doing significantly better in general with regard to her bilateral lower extremity ulcers. She has been tolerating the dressing changes without complication. The good news is most of the wounds have actually healed up and seem to be doing quite well her swelling is also dramatically improved. Overall the  main area that is still open on her lower extremities is really just her left dorsal foot the other areas are just small weeping areas that are drying out. In regard to her finger this likewise is seems to be doing very well today 09/04/2018 on evaluation today patient appears to be doing excellent in regard to her lower extremity. She actually is healed at all sites today although there is one new area on the right anterior lower extremity. This again I think is mainly there because with her having a lighter compression with a Kerlix and Coban it is not quite as good at keeping all the fluid out. This does not mean it does not work at all but nonetheless blisters developing as such or not out of the question. With that being said still I do not see anything that appears to be bad at this point and I think we can likely get this area to heal without complication. She is 2 weeks away from  her surgery. Electronic Signature(s) Signed: 09/04/2018 8:43:35 AM By: Worthy Keeler PA-C Entered By: Worthy Keeler on 09/04/2018 08:43:34 Arakelian, Theresa [name] ([id number]) -------------------------------------------------------------------------------- Physical Exam Details Patient Name: Abadi, Conda P. Date of Service: 09/04/2018 8:15 AM Medical Record Number: [id number] Patient Account Number: [id number] Date of Birth/Sex: 1940-07-02 (78 y.o. F) Treating RN: Cornell Barman Primary Care Provider: Johny Drilling Other Clinician: Referring Provider: Johny Drilling Treating Provider/Extender: Melburn Hake, Dannie Woolen Weeks in Treatment: 8 Constitutional Obese and well-hydrated in no acute distress. Respiratory normal breathing without difficulty. clear to auscultation bilaterally. Cardiovascular regular rate and rhythm with normal S1, S2. Psychiatric this patient is able to make decisions and demonstrates good insight into disease process. Alert and Oriented x 3. pleasant and cooperative. Notes Upon inspection today  patient's new wound appears to be very superficial. Her swelling is still under pretty good control even with just the Kerlix and Covan and fortunately everything seems to have dried up that was previously there is just this 1 new little blister that appears to have ruptured on the right anterior lower extremity. Nonetheless she seems to be doing quite well which I am very pleased with at this time. Electronic Signature(s) Signed: 09/04/2018 8:44:08 AM By: Worthy Keeler PA-C Entered By: Worthy Keeler on 09/04/2018 08:44:08 [name], Theresa [name] ([id number]) -------------------------------------------------------------------------------- Physician Orders Details Patient Name: Rhoads, Nyna P. Date of Service: 09/04/2018 8:15 AM Medical Record Number: [id number] Patient Account Number: [id number] Date of Birth/Sex: [birth year] (78 y.o. F) Treating RN: Cornell Barman Primary Care Provider: Johny Drilling Other Clinician: Referring Provider: Johny Drilling Treating Provider/Extender: Melburn Hake, Blessin Kanno Weeks in Treatment: 8 Verbal / Phone Orders: No Diagnosis Coding ICD-10 Coding Code Description E11.622 Type 2 diabetes mellitus with other skin ulcer I87.2 Venous insufficiency (chronic) (peripheral) L97.822 Non-pressure chronic ulcer of other part of left lower leg with fat layer exposed L97.522 Non-pressure chronic ulcer of other part of left foot with fat layer exposed S61.401A Unspecified open wound of right hand, initial encounter L98.492 Non-pressure chronic ulcer of skin 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 I10 Essential (primary) hypertension Wound Cleansing o Dial antibacterial soap, wash wounds, rinse and pat dry prior to dressing wounds Anesthetic (add to Medication List) Wound #8 Right,Anterior Lower Leg o Topical Lidocaine 4% cream applied to wound bed prior to debridement (In Clinic Only). Primary Wound Dressing Wound #8 Right,Anterior Lower Leg o Silver Alginate Secondary  Dressing Wound #8 Right,Anterior Lower Leg o ABD pad Dressing Change Frequency Wound #8 Right,Anterior Lower Leg o Change dressing every week Follow-up Appointments o Return Appointment in 1 week. o Nurse Visit as needed Edema Control Wound #8 Right,Anterior Lower Leg o Kerlix and Coban - Bilateral Electronic Signature(s) Signed: 09/04/2018 5:41:45 PM By: Gretta Cool, BSN, RN, CWS, Kim RN, BSN Signed: 09/05/2018 11:53:46 PM By: Herbert Pun, Pablo ([id number]) Entered By: Gretta Cool, BSN, RN, CWS, Kim on 09/04/2018 08:41:22 Bourne, Akshita Mamie Nick ([id number]) -------------------------------------------------------------------------------- Problem List Details Patient Name: [name], [name] Date of Service: 09/04/2018 8:15 AM Medical Record Number: [id number] Patient Account Number: [id number] Date of Birth/Sex: 1940/08/28 (78 y.o. F) Treating RN: Cornell Barman Primary Care Provider: Johny Drilling Other Clinician: Referring Provider: Johny Drilling Treating Provider/Extender: Melburn Hake, Ibrahima Holberg Weeks in Treatment: 8 Active Problems ICD-10 Evaluated Encounter Code Description Active Date Today Diagnosis E11.622 Type 2 diabetes mellitus with other skin ulcer 07/10/2018 No Yes I87.2 Venous insufficiency (chronic) (peripheral) 07/10/2018 No Yes L97.822 Non-pressure chronic ulcer of other part of left lower leg with  07/10/2018 No Yes fat layer exposed L97.522 Non-pressure chronic ulcer of other part of left foot with fat 08/14/2018 No Yes layer exposed S61.401A Unspecified open wound of right hand, initial encounter 08/14/2018 No Yes L98.492 Non-pressure chronic ulcer of skin of other sites with fat layer 08/14/2018 No Yes exposed I10 Essential (primary) hypertension 07/10/2018 No Yes Inactive Problems Resolved Problems Electronic Signature(s) Signed: 09/04/2018 8:34:39 AM By: Worthy Keeler PA-C Entered By: Worthy Keeler on 09/04/2018 08:34:39 [name], [name]  ([id number]) -------------------------------------------------------------------------------- Progress Note Details Patient Name: Harpenau, Dennice P. Date of Service: 09/04/2018 8:15 AM Medical Record Number: [id number] Patient Account Number: [id number] Date of Birth/Sex: 1940-10-23 (78 y.o. F) Treating RN: Cornell Barman Primary Care Provider: Johny Drilling Other Clinician: Referring Provider: Johny Drilling Treating Provider/Extender: Melburn Hake, Jakobee Brackins Weeks in Treatment: 8 Subjective Chief Complaint Information obtained from Patient Left LE ulcers and right hand ulcer History of Present Illness (HPI) 03/09/18 on evaluation today patient presents for initial inspection in our clinic concerning issues that she has been having with bilateral lower extremity ulcers. She has lymphedema as well as chronic venous stasis. She has seen Dr. dew where she's undergone ABI testing with a left ABI 1.28 a right ABI 1.22. Subsequently she is supposed to be undergoing venous studies as well which are upcoming. She does have a history of diabetes, chronic venous stasis, peripheral vascular disease, and hypertension. Currently these wounds which are currently draining are stated to have been open for roughly 2 weeks or so. It does not appear to be any evidence of infection. No fevers, chills, nausea, or vomiting noted at this time. With that being said the patient does have some prominence to the ankle medially especially on the left side and I'm not sure if something was rubbing which calls this issue either. She is unable to get around and move very well simply due to the fact that she has an issue with her right hip as well which unfortunately prevents her from being able to move around very much. She did have an Call[place]han placed at the request of her daughter when she was at vascular this seems to may be potentially helped a little bit. 03/16/18 on evaluation today patient's right lower extremity appears to likely  be completely healed although there still small area of concern which I'm gonna continue to watch. The really does not appear to be any weeping at this point which is good news. Overall I feel like she is done excellent. In regard to left lower extremity this is a little larger still than the right although again the swelling is indeed down. The wound over the media malleolus does appear to be doing better which is good news. No fevers, chills, nausea, or vomiting noted at this time. Unfortunately the patient still continues to have right hip pain which is getting more severe. Again her surgeon did not want to undergo surgery until everything was healed as far as the wounds of the right lower extremity. 03/23/18 on evaluation today patient actually appears to be doing excellent in regard to her right lower extremity. There are no open wounds at this point in time and there is no weeping of drainage the compression wraps appear to have done very well for her. She did get the FarrowWrap 4000 Velcro compression wraps which we are gonna initiate treatment with today and this should help continue to keep swelling under control as well is the weeping. Again everything appears to be doing great  in regard to the right lower extremity. In regard to left lower extremity everything is pretty much close there are no significant open wounds the only actual open wound that she has is on the medial left ankle which again is the opposite side from where her hip issue is and this is very superficial and shows no signs of infection at this point. 04/02/18 on evaluation today patient actually presents for an early appointment after I saw her last week due to several areas the left lower extremity which had reopened causing some issues at this point. Fortunately there does not appear to be any evidence of infection at this time. With that being said she again is waiting on everything to be better in order to be able  to proceed with the surgery for her right hip as previously suggested. 04/09/18 on evaluation today patient actually appears to be doing better in regard to her left lower surety ulcers which is great news. Unfortunately she continues to have issues even on the right side there are no open wounds that she does have a blister that arose on the right at this point as well. Fortunately there's no evidence of infection currently. No fevers, chills, nausea, or vomiting noted at this time. 04/16/18 on evaluation today patient actually appears to be doing very well in regard to her left lower extremity all the wounds appear to be completely healed including the left medial malleolus which was of greatest concern. The only if and when she has some regard to the right lower extremity where there is a very superficial area of blistering which is open at this point. Fortunately there's no signs of active infection otherwise. Overall I feel like she would be very safe to proceed with that Theresa [name], [name] ([id number]) surgery at this time. 04/21/18 on evaluation today patient presents for early follow-up she was supposed to be here in two days but nonetheless she is here sooner due to the fact that she's been having issues with her right wrap which is actually causing her a lot of discomfort. She states her leg has been more swollen and painful something she really has not experienced in recent weeks. Again this leg has been doing very well there's just a small blister that we were trading on the lateral aspect of her right leg. Again the left leg has pretty much completely resolved. Fortunately upon inspection today there does not appear to be any evidence of active infection systemically. With that being said I'm questioning whether or not she may have some cellulitis around the right lower extremity especially with the wrap somewhat pushed down and I think she may be scratching at  this area. Readmission: 07/10/18 patient presents today for reevaluation our clinic concerning issues that she has been having with her bilateral lower extremities with regard to open wounds/weeping. Right now she's having more than issue on the left posterior lower extremity although there is some weeping from the right posterior lower extremity as well. Fortunately there's no signs of active infection at this time which is good news. She really has not been using lymphedema pumps on a regular basis I do believe this would be of benefit for her. It also sounds like she has not been using the compression wraps that were Velcro that we got for her last time I think this likewise would also likely done better than just doing the ace wrap or whatnot when occasionally her legs were wrapped. Either way I think right  now that if we initiate compression wrapping we can likely get things back under control for her. She needs to have this under control so she can proceed with surgery for her right hip. 07/17/18 on evaluation today patient appears to be doing excellent in regard to her bilateral lower extremity. She's been tolerating the dressing changes without complication. Fortunately there's no signs of active infection at this time. In fact the reps seem to have done your job and honestly I do not see anything open today at all which is excellent news. 08/14/18 upon evaluation today patient's wounds actually appear to be doing significantly worse compared to what I last saw when I evaluated her one month ago. In the interim since that time and now it does not appear that she been wearing her Velcro compression wraps as directed on a daily basis. She tells me that she maybe wears them every other day although she's using her compression pumps every day. Nonetheless she has significant swelling as well as multiple areas of ulceration and skin opening which is not good. She's trying to keep her legs under  control so that she can have her hip surgery. With that being said they're not gonna would likely want to do the surgery if she has open wounds which is why we're trying to get these healed. That's not gonna happen if she doesn't wear compression. This was discussed with her in great detail today. 08/21/18 on evaluation today patient appears to be doing a little better in regard to her bilateral lower extremities although she only kept the wraps on from Friday until Monday. Even prior to that she Artie been snipping the tops to try to release some of the pressure. Nonetheless in the end she ended up with having to remove the wraps come Monday and unfortunately this has led to more swelling and again her legs don't look as well as I would've liked. Fortunately there's no signs of infection at this time. She did have her preop at [facility] and they told her that if her legs didn't look better than they do right now that she would not be able to proceed with the surgery. 08/28/2018 on evaluation today patient actually appears to be doing significantly better in general with regard to her bilateral lower extremity ulcers. She has been tolerating the dressing changes without complication. The good news is most of the wounds have actually healed up and seem to be doing quite well her swelling is also dramatically improved. Overall the main area that is still open on her lower extremities is really just her left dorsal foot the other areas are just small weeping areas that are drying out. In regard to her finger this likewise is seems to be doing very well today 09/04/2018 on evaluation today patient appears to be doing excellent in regard to her lower extremity. She actually is healed at all sites today although there is one new area on the right anterior lower extremity. This again I think is mainly there because with her having a lighter compression with a Kerlix and Coban it is not quite as good at keeping all  the fluid out. This does not mean it does not work at all but nonetheless blisters developing as such or not out of the question. With that being said still I do not see anything that appears to be bad at this point and I think we can likely get this area to heal without complication. She is 2 weeks  away from her surgery. Patient History Information obtained from Patient. Social History Theresa [name], Theresa [name] ([id number]) Never smoker, Marital Status - Married, Alcohol Use - Never, Drug Use - No History, Caffeine Use - Never. Medical History Eyes Patient has history of Cataracts - removed Denies history of Glaucoma, Optic Neuritis Ear/Nose/Mouth/Throat Denies history of Chronic sinus problems/congestion, Middle ear problems Hematologic/Lymphatic Denies history of Anemia, Hemophilia, Human Immunodeficiency Virus, Lymphedema, Sickle Cell Disease Respiratory Denies history of Aspiration, Asthma, Chronic Obstructive Pulmonary Disease (COPD), Pneumothorax, Sleep Apnea, Tuberculosis Cardiovascular Patient has history of Angina, Coronary Artery Disease, Hypertension, Myocardial Infarction - more than 5 years ago, Peripheral Venous Disease Denies history of Arrhythmia, Congestive Heart Failure, Deep Vein Thrombosis, Hypotension, Peripheral Arterial Disease, Phlebitis, Vasculitis Gastrointestinal Denies history of Cirrhosis , Colitis, Crohn s, Hepatitis A, Hepatitis B, Hepatitis C Endocrine Patient has history of Type II Diabetes Denies history of Type I Diabetes Genitourinary Denies history of End Stage Renal Disease Immunological Denies history of Lupus Erythematosus, Raynaud s, Scleroderma Integumentary (Skin) Denies history of History of Burn, History of pressure wounds Musculoskeletal Patient has history of Osteoarthritis Denies history of Gout, Rheumatoid Arthritis, Osteomyelitis Neurologic Patient has history of Neuropathy Denies history of Dementia, Quadriplegia, Paraplegia,  Seizure Disorder Oncologic Patient has history of Received Chemotherapy, Received Radiation Psychiatric Denies history of Anorexia/bulimia, Confinement Anxiety Medical And Surgical History Notes Genitourinary CKD stage 3 Oncologic 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 Respiratory Denies complaints or symptoms of Chronic or frequent coughs, Shortness of Breath. Cardiovascular Complains or has symptoms of LE edema. Denies complaints or symptoms of Chest pain. Psychiatric Denies complaints or symptoms of Anxiety, Claustrophobia. Makisha, Marrin Jode P. ([id number]) Objective Constitutional Obese and well-hydrated in no acute distress. Vitals Time Taken: 8:15 AM, Height: 67 in, Weight: 216 lbs, BMI: 33.8, Temperature: 97.8 F, Pulse: 86 bpm, Respiratory Rate: 18 breaths/min, Blood Pressure: 158/66 mmHg. Respiratory normal breathing without difficulty. clear to auscultation bilaterally. Cardiovascular regular rate and rhythm with normal S1, S2. Psychiatric this patient is able to make decisions and demonstrates good insight into disease process. Alert and Oriented x 3. pleasant and cooperative. General Notes: Upon inspection today patient's new wound appears to be very superficial. Her swelling is still under pretty good control even with just the Kerlix and Covan and fortunately everything seems to have dried up that was previously there is just this 1 new little blister that appears to have ruptured on the right anterior lower extremity. Nonetheless she seems to be doing quite well which I am very pleased with at this time. Integumentary (Hair, Skin) Wound #7 status is Healed - Epithelialized. Original cause of wound was Gradually Appeared. The wound is located on the Left,Dorsal Foot. The wound measures 0cm length x 0cm  width x 0cm depth; 0cm^2 area and 0cm^3 volume. There is Fat Layer (Subcutaneous Tissue) Exposed exposed. There is no tunneling or undermining noted. There is a medium amount of serous drainage noted. The wound margin is flat and intact. There is large (67-100%) pink granulation within the wound bed. There is no necrotic tissue within the wound bed. Wound #8 status is Open. Original cause of wound was Gradually Appeared. The wound is located on the Right,Anterior Lower Leg. The wound measures 1.5cm length x 1.1cm width x 0.1cm depth; 1.296cm^2 area and 0.13cm^3 volume. There is Fat Layer (Subcutaneous Tissue) Exposed exposed. There is no tunneling  or undermining noted. There is a medium amount of drainage noted. There is medium (34-66%) red granulation within the wound bed. There is a medium (34-66%) amount of necrotic tissue within the wound bed including Adherent Slough. Wound #9 status is Healed - Epithelialized. Original cause of wound was Trauma. The wound is located on the Right Hand - 2nd Digit. The wound measures 0cm length x 0cm width x 0cm depth; 0cm^2 area and 0cm^3 volume. There is Fat Layer (Subcutaneous Tissue) Exposed exposed. There is no tunneling or undermining noted. There is a none present amount of drainage noted. The wound margin is thickened. There is no granulation within the wound bed. There is a medium (34-66%) amount of necrotic tissue within the wound bed. Assessment Active Problems ICD-10 Theresa [name], SCHUITEMA. 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 Primary Wound Dressing: Wound #8 Right,Anterior Lower Leg: Silver Alginate Secondary Dressing: Wound #8 Right,Anterior Lower Leg: ABD pad Dressing Change Frequency: Wound #8 Right,Anterior Lower Leg: Change dressing every week Follow-up Appointments: Return Appointment in 1 week. Nurse Visit as needed Edema Control: Wound #8 Right,Anterior Lower Leg: Kerlix and Coban - Bilateral 1. I would recommend that we go ahead and continue with the silver alginate for the new area today that is seen. She is in agreement with this plan. 2. We will also continue with the Kerlix and Coban wrap as this seems to be beneficial for her and again it is not quite as good as a 3 layer compression wrap but nonetheless I think is sufficient to keep things under good control again will try to hold out until she is able to have her hip surgery in 2 weeks. 3. As much as she can I would recommend she continue to try to elevate her legs to whatever degree she can. Especially on the left. We will see patient back for reevaluation in 1 week here in the clinic. If anything worsens or changes patient will contact our office for additional recommendations. Electronic Signature(s) Signed: 09/04/2018 8:45:33 AM By: Worthy Keeler PA-C Entered By: Worthy Keeler on 09/04/2018 08:45:33 [place], Theresa [name] ([id number]) -------------------------------------------------------------------------------- ROS/PFSH Details Patient Name: Tiedt, Bailynn P. Date of Service: 09/04/2018 8:15 AM Medical Record Number: [id number] Patient Account Number: [id number] Date of Birth/Sex: 1940/06/02 (78 y.o. F) Treating RN: Cornell Barman Primary Care Provider: Johny Drilling Other Clinician: Referring Provider:  Johny Drilling Treating Provider/Extender: Melburn Hake, Nerida Boivin Weeks in Treatment: 8 Information Obtained From Patient Constitutional Symptoms (General Health) Complaints and Symptoms: Negative for: Fatigue; Fever; Chills; Marked Weight Change Respiratory Complaints and Symptoms: Negative for: Chronic or frequent coughs; Shortness of Breath Medical History: Negative for: Aspiration; Asthma; Chronic Obstructive Pulmonary Disease (COPD); Pneumothorax; Sleep Apnea; Tuberculosis Cardiovascular Complaints and Symptoms: Positive for: LE edema Negative for: Chest pain Medical History: Positive for: Angina; Coronary Artery Disease; Hypertension; Myocardial Infarction - more than 5 years ago; Peripheral Venous Disease Negative for: Arrhythmia; Congestive Heart Failure; Deep  Vein Thrombosis; Hypotension; Peripheral Arterial Disease; Phlebitis; Vasculitis Psychiatric Complaints and Symptoms: Negative for: Anxiety; Claustrophobia Medical History: Negative for: Anorexia/bulimia; Confinement Anxiety Eyes Medical History: Positive for: Cataracts - removed Negative for: Glaucoma; Optic Neuritis Ear/Nose/Mouth/Throat Medical History: Negative for: Chronic sinus problems/congestion; Middle ear problems Hematologic/Lymphatic MATEJA, DIER. ([id number]) Medical History: Negative for: Anemia; Hemophilia; Human Immunodeficiency Virus; Lymphedema; Sickle Cell Disease Gastrointestinal Medical History: Negative for: Cirrhosis ; Colitis; Crohnos; Hepatitis A; Hepatitis B; Hepatitis C Endocrine Medical History: Positive for: Type II Diabetes Negative for: Type I Diabetes Treated with: Insulin Blood sugar tested every day: Yes Tested : Blood sugar testing results: Breakfast: 211; Lunch: 240 Genitourinary Medical History: Negative for: End Stage Renal Disease Past Medical History Notes: CKD stage 3 Immunological Medical History: Negative for: Lupus Erythematosus; Raynaudos;  Scleroderma Integumentary (Skin) Medical History: Negative for: History of Burn; History of pressure wounds Musculoskeletal Medical History: Positive for: Osteoarthritis Negative for: Gout; Rheumatoid Arthritis; Osteomyelitis Neurologic Medical History: Positive for: Neuropathy Negative for: Dementia; Quadriplegia; Paraplegia; Seizure Disorder Oncologic Medical History: Positive for: Received Chemotherapy; Received Radiation Past Medical History Notes: 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 ([id number]) Immunizations Pneumococcal Vaccine: Received Pneumococcal Vaccination: Yes Implantable Devices No devices added Family and Social History Never smoker; Marital Status - Married; Alcohol Use: Never; Drug Use: No History; Caffeine Use: Never; Financial Concerns: No; Food, Clothing or Shelter Needs: No; Support System Lacking: No; Transportation Concerns: No Physician Affirmation I have reviewed and agree with the above information. Electronic Signature(s) Signed: 09/04/2018 5:41:45 PM By: Gretta Cool, BSN, RN, CWS, Kim RN, BSN Signed: 09/05/2018 11:53:46 PM By: Worthy Keeler PA-C Entered By: Worthy Keeler on 09/04/2018 08:43:53 Ebron, Bassy P. ([id number]) -------------------------------------------------------------------------------- SuperBill Details Patient Name: Royse, Brealynn P. Date of Service: 09/04/2018 Medical Record Number: [id number] Patient Account Number: [id number] Date of Birth/Sex: 26-Sep-1940 (78 y.o. F) Treating RN: Cornell Barman Primary Care Provider: Johny Drilling Other Clinician: Referring Provider: Johny Drilling Treating Provider/Extender: Melburn Hake, Isebella Upshur Weeks in Treatment: 8 Diagnosis Coding ICD-10 Codes Code Description E11.622 Type 2 diabetes mellitus with other skin ulcer I87.2 Venous insufficiency (chronic) (peripheral) L97.822 Non-pressure chronic ulcer of other part  of left lower leg with fat layer exposed L97.522 Non-pressure chronic ulcer of other part of left foot with fat layer exposed S61.401A Unspecified open wound of right hand, initial encounter L98.492 Non-pressure chronic ulcer of skin 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 [place] (primary) hypertension Facility Procedures CPT4 Code: [id number] Description: 99213 - WOUND CARE VISIT-LEV 3 EST PT Modifier: Quantity: 1 Physician Procedures CPT4 Code Description: [id number] 09470 - WC PHYS LEVEL 4 - EST PT ICD-10 Diagnosis Description E11.622 Type 2 diabetes mellitus with other skin ulcer I87.2 Venous insufficiency (chronic) (peripheral) L97.822 Non-pressure chronic ulcer of other part of left  lower leg with L97.522 Non-pressure chronic ulcer of other part of left foot with fat Modifier: fat layer expos layer exposed Quantity: 1 ed Electronic Signature(s) Signed: 09/04/2018 8:45:48 AM By: Worthy Keeler PA-C Entered By: Worthy Keeler on 09/04/2018 08:45:48

## 2018-09-11 ENCOUNTER — Encounter: Payer: Medicare HMO | Admitting: Physician Assistant

## 2018-09-11 ENCOUNTER — Other Ambulatory Visit: Payer: Self-pay

## 2018-09-11 DIAGNOSIS — E11621 Type 2 diabetes mellitus with foot ulcer: Secondary | ICD-10-CM | POA: Diagnosis not present

## 2018-09-11 NOTE — Progress Notes (Addendum)
Theresa [name], Theresa [name] ([id number]) Visit Report for 09/11/2018 Chief Complaint Document Details Patient Name: Theresa [name], Theresa [name]. Date of Service: 09/11/2018 8:15 AM Medical Record Number: [id number] Patient Account Number: [id number] Date of Birth/Sex: 1940-04-02 (78 y.o. F) Treating RN: Montey Hora Primary Care Provider: Johny Drilling Other Clinician: Referring Provider: Johny Drilling Treating Provider/Extender: Melburn Hake, HOYT Weeks in Treatment: 9 Information Obtained from: Patient Chief Complaint Left LE ulcers and right hand ulcer Electronic Signature(s) Signed: 09/11/2018 8:39:21 AM By: Worthy Keeler PA-C Entered By: Worthy Keeler on 09/11/2018 08:39:21 Avitabile, Dyllan P. ([id number]) -------------------------------------------------------------------------------- Debridement Details Patient Name: Theresa [name], [name] Date of Service: 09/11/2018 8:15 AM Medical Record Number: [id number] Patient Account Number: [id number] Date of Birth/Sex: 1940/12/27 (78 y.o. F) Treating RN: Montey Hora Primary Care Provider: Johny Drilling Other Clinician: Referring Provider: Johny Drilling Treating Provider/Extender: Melburn Hake, HOYT Weeks in Treatment: 9 Debridement Performed for Wound #9R Right Hand - 2nd Digit Assessment: Performed By: Physician STONE III, HOYT E., PA-C Debridement Type: Debridement Level of Consciousness (Pre- Awake and Alert procedure): Pre-procedure Verification/Time Yes - 08:55 Out Taken: Start Time: 08:55 Pain Control: Lidocaine 4% Topical Solution Total Area Debrided (L x W): 0.4 (cm) x 0.3 (cm) = 0.12 (cm) Tissue and other material Viable, Non-Viable, Callus, Slough, Subcutaneous, Slough debrided: Level: Skin/Subcutaneous Tissue Debridement Description: Excisional Instrument: Curette Bleeding: Minimum Hemostasis Achieved: Pressure End Time: 08:57 Procedural Pain: 0 Post Procedural Pain: 0 Response to Treatment: Procedure was tolerated well Level of  Consciousness Awake and Alert (Post-procedure): Post Debridement Measurements of Total Wound Length: (cm) 0.4 Width: (cm) 0.3 Depth: (cm) 0.2 Volume: (cm) 0.019 Character of Wound/Ulcer Post Debridement: Improved Post Procedure Diagnosis Same as Pre-procedure Electronic Signature(s) Signed: 09/11/2018 4:41:14 PM By: Montey Hora Signed: 09/12/2018 2:28:02 AM By: Worthy Keeler PA-C Entered By: Montey Hora on 09/11/2018 08:58:06 Theresa [name], [name] ([id number]) -------------------------------------------------------------------------------- HPI Details Patient Name: Theresa [name], [name] Date of Service: 09/11/2018 8:15 AM Medical Record Number: [id number] Patient Account Number: [id number] Date of Birth/Sex: [birth year] (78 y.o. F) Treating RN: Montey Hora Primary Care Provider: Johny Drilling Other Clinician: Referring Provider: Johny Drilling Treating Provider/Extender: Melburn Hake, HOYT Weeks in Treatment: 9 History of Present Illness HPI Description: 03/09/18 on evaluation today patient presents for initial inspection in our clinic concerning issues that she has been having with bilateral lower extremity ulcers. She has lymphedema as well as chronic venous stasis. She has seen Dr. dew where she's undergone ABI testing with a left ABI 1.28 a right ABI 1.22. Subsequently she is supposed to be undergoing venous studies as well which are upcoming. She does have a history of diabetes, chronic venous stasis, peripheral vascular disease, and hypertension. Currently these wounds which are currently draining are stated to have been open for roughly 2 weeks or so. It does not appear to be any evidence of infection. No fevers, chills, nausea, or vomiting noted at this time. With that being said the patient does have some prominence to the ankle medially especially on the left side and I'm not sure if something was rubbing which calls this issue either. She is unable to get around and move very  well simply due to the fact that she has an issue with her right hip as well which unfortunately prevents her from being able to move around very much. She did have an [place] placed at the request of her daughter when she was at vascular this seems to may be potentially helped a little bit. 03/16/18 on evaluation  today patient's right lower extremity appears to likely be completely healed although there still small area of concern which I'm gonna continue to watch. The really does not appear to be any weeping at this point which is good news. Overall I feel like she is done excellent. In regard to left lower extremity this is a little larger still than the right although again the swelling is indeed down. The wound over the media malleolus does appear to be doing better which is good news. No fevers, chills, nausea, or vomiting noted at this time. Unfortunately the patient still continues to have right hip pain which is getting more severe. Again her surgeon did not want to undergo surgery until everything was healed as far as the wounds of the right lower extremity. 03/23/18 on evaluation today patient actually appears to be doing excellent in regard to her right lower extremity. There are no open wounds at this point in time and there is no weeping of drainage the compression wraps appear to have done very well for her. She did get the FarrowWrap 4000 Velcro compression wraps which we are gonna initiate treatment with today and this should help continue to keep swelling under control as well is the weeping. Again everything appears to be doing great in regard to the right lower extremity. In regard to left lower extremity everything is pretty much close there are no significant open wounds the only actual open wound that she has is on the medial left ankle which again is the opposite side from where her hip issue is and this is very superficial and shows no signs of infection at this  point. 04/02/18 on evaluation today patient actually presents for an early appointment after I saw her last week due to several areas the left lower extremity which had reopened causing some issues at this point. Fortunately there does not appear to be any evidence of infection at this time. With that being said she again is waiting on everything to be better in order to be able to proceed with the surgery for her right hip as previously suggested. 04/09/18 on evaluation today patient actually appears to be doing better in regard to her left lower surety ulcers which is great news. Unfortunately she continues to have issues even on the right side there are no open wounds that she does have a blister that arose on the right at this point as well. Fortunately there's no evidence of infection currently. No fevers, chills, nausea, or vomiting noted at this time. 04/16/18 on evaluation today patient actually appears to be doing very well in regard to her left lower extremity all the wounds appear to be completely healed including the left medial malleolus which was of greatest concern. The only if and when she has some regard to the right lower extremity where there is a very superficial area of blistering which is open at this point. Fortunately there's no signs of active infection otherwise. Overall I feel like she would be very safe to proceed with that surgery at this time. 04/21/18 on evaluation today patient presents for early follow-up she was supposed to be here in two days but nonetheless she is here sooner due to the fact that she's been having issues with her right wrap which is actually causing her a lot of discomfort. She states her leg has been more swollen and painful something she really has not experienced in recent weeks. Simonin, Ubah P. ([id number]) Again this leg has been doing  very well there's just a small blister that we were trading on the lateral aspect of her right leg. Again the  left leg has pretty much completely resolved. Fortunately upon inspection today there does not appear to be any evidence of active infection systemically. With that being said I'm questioning whether or not she may have some cellulitis around the right lower extremity especially with the wrap somewhat pushed down and I think she may be scratching at this area. Readmission: 07/10/18 patient presents today for reevaluation our clinic concerning issues that she has been having with her bilateral lower extremities with regard to open wounds/weeping. Right now she's having more than issue on the left posterior lower extremity although there is some weeping from the right posterior lower extremity as well. Fortunately there's no signs of active infection at this time which is good news. She really has not been using lymphedema pumps on a regular basis I do believe this would be of benefit for her. It also sounds like she has not been using the compression wraps that were Velcro that we got for her last time I think this likewise would also likely done better than just doing the ace wrap or whatnot when occasionally her legs were wrapped. Either way I think right now that if we initiate compression wrapping we can likely get things back under control for her. She needs to have this under control so she can proceed with surgery for her right hip. 07/17/18 on evaluation today patient appears to be doing excellent in regard to her bilateral lower extremity. She's been tolerating the dressing changes without complication. Fortunately there's no signs of active infection at this time. In fact the reps seem to have done your job and honestly I do not see anything open today at all which is excellent news. 08/14/18 upon evaluation today patient's wounds actually appear to be doing significantly worse compared to what I last saw when I evaluated her one month ago. In the interim since that time and now it does not  appear that she been wearing her Velcro compression wraps as directed on a daily basis. She tells me that she maybe wears them every other day although she's using her compression pumps every day. Nonetheless she has significant swelling as well as multiple areas of ulceration and skin opening which is not good. She's trying to keep her legs under control so that she can have her hip surgery. With that being said they're not gonna would likely want to do the surgery if she has open wounds which is why we're trying to get these healed. That's not gonna happen if she doesn't wear compression. This was discussed with her in great detail today. 08/21/18 on evaluation today patient appears to be doing a little better in regard to her bilateral lower extremities although she only kept the wraps on from Friday until Monday. Even prior to that she Artie been snipping the tops to try to release some of the pressure. Nonetheless in the end she ended up with having to remove the wraps come Monday and unfortunately this has led to more swelling and again her legs don't look as well as I would've liked. Fortunately there's no signs of infection at this time. She did have her preop at [facility] and they told her that if her legs didn't look better than they do right now that she would not be able to proceed with the surgery. 08/28/2018 on evaluation today patient actually appears to  be doing significantly better in general with regard to her bilateral lower extremity ulcers. She has been tolerating the dressing changes without complication. The good news is most of the wounds have actually healed up and seem to be doing quite well her swelling is also dramatically improved. Overall the main area that is still open on her lower extremities is really just her left dorsal foot the other areas are just small weeping areas that are drying out. In regard to her finger this likewise is seems to be doing very well  today 09/04/2018 on evaluation today patient appears to be doing excellent in regard to her lower extremity. She actually is healed at all sites today although there is one new area on the right anterior lower extremity. This again I think is mainly there because with her having a lighter compression with a Kerlix and Coban it is not quite as good at keeping all the fluid out. This does not mean it does not work at all but nonetheless blisters developing as such or not out of the question. With that being said still I do not see anything that appears to be bad at this point and I think we can likely get this area to heal without complication. She is 2 weeks away from her surgery. 09/11/2018 upon evaluation today patient actually appears to be doing quite well with regard to the wounds on the left lower extremity bilateral lower extremities. She has been tolerating the dressing changes without complication. Fortunately there is no signs of active infection at this time. With that being said she unfortunately does have an issue with her finger yet again this has reopened previously we had manage this and it seemed to do well with Santyl. With that being said she has a lot of thick almost callus like tissue noted at the site and subsequently is going to require sharp debridement today. Overall I am pleased though with how things stand unfortunately her wraps did slide down she tells me that she tried to pull them back up but was not able to. Electronic Signature(s) ARDATH, LEPAK ([id number]) Signed: 09/11/2018 9:09:21 AM By: Worthy Keeler PA-C Entered By: Worthy Keeler on 09/11/2018 09:09:21 Theresa [name], Theresa [name] ([id number]) -------------------------------------------------------------------------------- Physical Exam Details Patient Name: Mullet, Captola P. Date of Service: 09/11/2018 8:15 AM Medical Record Number: [id number] Patient Account Number: [id number] Date of Birth/Sex: [birth year] (78  y.o. F) Treating RN: Montey Hora Primary Care Provider: Johny Drilling Other Clinician: Referring Provider: Johny Drilling Treating Provider/Extender: Melburn Hake, HOYT Weeks in Treatment: 9 Constitutional Well-nourished and well-hydrated in no acute distress. Respiratory normal breathing without difficulty. clear to auscultation bilaterally. Cardiovascular regular rate and rhythm with normal S1, S2. Psychiatric this patient is able to make decisions and demonstrates good insight into disease process. Alert and Oriented x 3. pleasant and cooperative. Notes Upon inspection today patient's lower extremities appear to have a couple areas that seem to blister although I think this is due to the fact that her wrap slid down somewhat. Nonetheless I think that the wrap still did do better for her than just the juxta light alone and again her surgery is 1 week from today. Nonetheless I think we need to try to keep things under control in the meantime. I did perform sharp debridement to the patient's ulcer on her finger this is the right hand second digit near the tip. I was able to remove callus as well as some slough down to subcutaneous tissue. She  tolerated this with no pain. Electronic Signature(s) Signed: 09/11/2018 9:11:42 AM By: Worthy Keeler PA-C Entered By: Worthy Keeler on 09/11/2018 09:11:41 Stineman, Fredna Dow ([id number]) -------------------------------------------------------------------------------- Physician Orders Details Patient Name: Theresa [name], [name] Date of Service: 09/11/2018 8:15 AM Medical Record Number: [id number] Patient Account Number: [id number] Date of Birth/Sex: 1940-07-02 (78 y.o. F) Treating RN: Montey Hora Primary Care Provider: Johny Drilling Other Clinician: Referring Provider: Johny Drilling Treating Provider/Extender: Melburn Hake, HOYT Weeks in Treatment: 9 Verbal / Phone Orders: No Diagnosis Coding ICD-10 Coding Code Description E11.622 Type 2  diabetes mellitus with other skin ulcer I87.2 Venous insufficiency (chronic) (peripheral) L97.822 Non-pressure chronic ulcer of other part of left lower leg with fat layer exposed L97.522 Non-pressure chronic ulcer of other part of left foot with fat layer exposed S61.401A Unspecified open wound of right hand, initial encounter L98.492 Non-pressure chronic ulcer of skin 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 I10 Essential (primary) hypertension Wound Cleansing Wound #8 Right,Anterior Lower Leg o Dial antibacterial soap, wash wounds, rinse and pat dry prior to dressing wounds Wound #9R Right Hand - 2nd Digit o Dial antibacterial soap, wash wounds, rinse and pat dry prior to dressing wounds Anesthetic (add to Medication List) Wound #8 Right,Anterior Lower Leg o Topical Lidocaine 4% cream applied to wound bed prior to debridement (In Clinic Only). Wound #9R Right Hand - 2nd Digit o Topical Lidocaine 4% cream applied to wound bed prior to debridement (In Clinic Only). Primary Wound Dressing Wound #8 Right,Anterior Lower Leg o Silver Alginate - and blister on left leg Wound #9R Right Hand - 2nd Digit o Silver Collagen Secondary Dressing Wound #8 Right,Anterior Lower Leg o ABD pad Dressing Change Frequency Wound #8 Right,Anterior Lower Leg o Change dressing every week Wound #9R Right Hand - 2nd Digit o Change dressing every other day. Theresa [name], Theresa [name] ([id number]) Follow-up Appointments o Return Appointment in 2 weeks. o Nurse Visit as needed Edema Control Wound #8 Right,Anterior Lower Leg o Kerlix and Coban - Bilateral Patient Medications Allergies: codeine, morphine HCl Notifications Medication Indication Start End Keflex 09/11/2018 DOSE 1 - oral 500 mg capsule - 1 capsule oral taken 4 times a day for 7 days Electronic Signature(s) Signed: 09/11/2018 2:15:08 PM By: Worthy Keeler PA-C Entered By: Worthy Keeler on 09/11/2018 14:15:07 Munford, Lealer P.  ([id number]) -------------------------------------------------------------------------------- Problem List Details Patient Name: Fenstermacher, Ahtziri P. Date of Service: 09/11/2018 8:15 AM Medical Record Number: [id number] Patient Account Number: [id number] Date of Birth/Sex: 07/27/1940 (78 y.o. F) Treating RN: Montey Hora Primary Care Provider: Johny Drilling Other Clinician: Referring Provider: Johny Drilling Treating Provider/Extender: Melburn Hake, HOYT Weeks in Treatment: 9 Active Problems ICD-10 Evaluated Encounter Code Description Active Date Today Diagnosis E11.622 Type 2 diabetes mellitus with other skin ulcer 07/10/2018 No Yes I87.2 Venous insufficiency (chronic) (peripheral) 07/10/2018 No Yes L97.822 Non-pressure chronic ulcer of other part of left lower leg with 07/10/2018 No Yes fat layer exposed L97.522 Non-pressure chronic ulcer of other part of left foot with fat 08/14/2018 No Yes layer exposed S61.401A Unspecified open wound of right hand, initial encounter 08/14/2018 No Yes L98.492 Non-pressure chronic ulcer of skin of other sites with fat layer 08/14/2018 No Yes exposed I10 Essential (primary) hypertension 07/10/2018 No Yes Inactive Problems Resolved Problems Electronic Signature(s) Signed: 09/11/2018 8:35:09 AM By: Worthy Keeler PA-C Entered By: Worthy Keeler on 09/11/2018 08:35:09 Drone, [place] P. ([id number]) -------------------------------------------------------------------------------- Progress Note Details Patient Name: Theresa [name], [name] Date of Service: 09/11/2018 8:15 AM Medical Record  Number: [id number] Patient Account Number: [id number] Date of Birth/Sex: 1940-08-06 (78 y.o. F) Treating RN: Montey Hora Primary Care Provider: Johny Drilling Other Clinician: Referring Provider: Johny Drilling Treating Provider/Extender: Melburn Hake, HOYT Weeks in Treatment: 9 Subjective Chief Complaint Information obtained from Patient Left LE ulcers and right hand  ulcer History of Present Illness (HPI) 03/09/18 on evaluation today patient presents for initial inspection in our clinic concerning issues that she has been having with bilateral lower extremity ulcers. She has lymphedema as well as chronic venous stasis. She has seen Dr. dew where she's undergone ABI testing with a left ABI 1.28 a right ABI 1.22. Subsequently she is supposed to be undergoing venous studies as well which are upcoming. She does have a history of diabetes, chronic venous stasis, peripheral vascular disease, and hypertension. Currently these wounds which are currently draining are stated to have been open for roughly 2 weeks or so. It does not appear to be any evidence of infection. No fevers, chills, nausea, or vomiting noted at this time. With that being said the patient does have some prominence to the ankle medially especially on the left side and I'm not sure if something was rubbing which calls this issue either. She is unable to get around and move very well simply due to the fact that she has an issue with her right hip as well which unfortunately prevents her from being able to move around very much. She did have an [place] placed at the request of her daughter when she was at vascular this seems to may be potentially helped a little bit. 03/16/18 on evaluation today patient's right lower extremity appears to likely be completely healed although there still small area of concern which I'm gonna continue to watch. The really does not appear to be any weeping at this point which is good news. Overall I feel like she is done excellent. In regard to left lower extremity this is a little larger still than the right although again the swelling is indeed down. The wound over the media malleolus does appear to be doing better which is good news. No fevers, chills, nausea, or vomiting noted at this time. Unfortunately the patient still continues to have right hip pain which is getting  more severe. Again her surgeon did not want to undergo surgery until everything was healed as far as the wounds of the right lower extremity. 03/23/18 on evaluation today patient actually appears to be doing excellent in regard to her right lower extremity. There are no open wounds at this point in time and there is no weeping of drainage the compression wraps appear to have done very well for her. She did get the FarrowWrap 4000 Velcro compression wraps which we are gonna initiate treatment with today and this should help continue to keep swelling under control as well is the weeping. Again everything appears to be doing great in regard to the right lower extremity. In regard to left lower extremity everything is pretty much close there are no significant open wounds the only actual open wound that she has is on the medial left ankle which again is the opposite side from where her hip issue is and this is very superficial and shows no signs of infection at this point. 04/02/18 on evaluation today patient actually presents for an early appointment after I saw her last week due to several areas the left lower extremity which had reopened causing some issues at this point. Fortunately there does not  appear to be any evidence of infection at this time. With that being said she again is waiting on everything to be better in order to be able to proceed with the surgery for her right hip as previously suggested. 04/09/18 on evaluation today patient actually appears to be doing better in regard to her left lower surety ulcers which is great news. Unfortunately she continues to have issues even on the right side there are no open wounds that she does have a blister that arose on the right at this point as well. Fortunately there's no evidence of infection currently. No fevers, chills, nausea, or vomiting noted at this time. 04/16/18 on evaluation today patient actually appears to be doing very well in regard to  her left lower extremity all the wounds appear to be completely healed including the left medial malleolus which was of greatest concern. The only if and when she has some regard to the right lower extremity where there is a very superficial area of blistering which is open at this point. Fortunately there's no signs of active infection otherwise. Overall I feel like she would be very safe to proceed with that Winning, [name] ([id number]) surgery at this time. 04/21/18 on evaluation today patient presents for early follow-up she was supposed to be here in two days but nonetheless she is here sooner due to the fact that she's been having issues with her right wrap which is actually causing her a lot of discomfort. She states her leg has been more swollen and painful something she really has not experienced in recent weeks. Again this leg has been doing very well there's just a small blister that we were trading on the lateral aspect of her right leg. Again the left leg has pretty much completely resolved. Fortunately upon inspection today there does not appear to be any evidence of active infection systemically. With that being said I'm questioning whether or not she may have some cellulitis around the right lower extremity especially with the wrap somewhat pushed down and I think she may be scratching at this area. Readmission: 07/10/18 patient presents today for reevaluation our clinic concerning issues that she has been having with her bilateral lower extremities with regard to open wounds/weeping. Right now she's having more than issue on the left posterior lower extremity although there is some weeping from the right posterior lower extremity as well. Fortunately there's no signs of active infection at this time which is good news. She really has not been using lymphedema pumps on a regular basis I do believe this would be of benefit for her. It also sounds like she has not been using the  compression wraps that were Velcro that we got for her last time I think this likewise would also likely done better than just doing the ace wrap or whatnot when occasionally her legs were wrapped. Either way I think right now that if we initiate compression wrapping we can likely get things back under control for her. She needs to have this under control so she can proceed with surgery for her right hip. 07/17/18 on evaluation today patient appears to be doing excellent in regard to her bilateral lower extremity. She's been tolerating the dressing changes without complication. Fortunately there's no signs of active infection at this time. In fact the reps seem to have done your job and honestly I do not see anything open today at all which is excellent news. 08/14/18 upon evaluation today patient's wounds actually appear  to be doing significantly worse compared to what I last saw when I evaluated her one month ago. In the interim since that time and now it does not appear that she been wearing her Velcro compression wraps as directed on a daily basis. She tells me that she maybe wears them every other day although she's using her compression pumps every day. Nonetheless she has significant swelling as well as multiple areas of ulceration and skin opening which is not good. She's trying to keep her legs under control so that she can have her hip surgery. With that being said they're not gonna would likely want to do the surgery if she has open wounds which is why we're trying to get these healed. That's not gonna happen if she doesn't wear compression. This was discussed with her in great detail today. 08/21/18 on evaluation today patient appears to be doing a little better in regard to her bilateral lower extremities although she only kept the wraps on from Friday until Monday. Even prior to that she Artie been snipping the tops to try to release some of the pressure. Nonetheless in the end she ended  up with having to remove the wraps come Monday and unfortunately this has led to more swelling and again her legs don't look as well as I would've liked. Fortunately there's no signs of infection at this time. She did have her preop at [facility] and they told her that if her legs didn't look better than they do right now that she would not be able to proceed with the surgery. 08/28/2018 on evaluation today patient actually appears to be doing significantly better in general with regard to her bilateral lower extremity ulcers. She has been tolerating the dressing changes without complication. The good news is most of the wounds have actually healed up and seem to be doing quite well her swelling is also dramatically improved. Overall the main area that is still open on her lower extremities is really just her left dorsal foot the other areas are just small weeping areas that are drying out. In regard to her finger this likewise is seems to be doing very well today 09/04/2018 on evaluation today patient appears to be doing excellent in regard to her lower extremity. She actually is healed at all sites today although there is one new area on the right anterior lower extremity. This again I think is mainly there because with her having a lighter compression with a Kerlix and Coban it is not quite as good at keeping all the fluid out. This does not mean it does not work at all but nonetheless blisters developing as such or not out of the question. With that being said still I do not see anything that appears to be bad at this point and I think we can likely get this area to heal without complication. She is 2 weeks away from her surgery. 09/11/2018 upon evaluation today patient actually appears to be doing quite well with regard to the wounds on the left lower extremity bilateral lower extremities. She has been tolerating the dressing changes without complication. Fortunately there is no signs of active infection  at this time. With that being said she unfortunately does have an issue with her finger yet again this has reopened previously we had manage this and it seemed to do well with Santyl. With that being said she has a lot of thick almost callus like tissue noted at the site and subsequently is going  to require sharp debridement today. Overall I am Theresa [name], [name] ([id number]) pleased though with how things stand unfortunately her wraps did slide down she tells me that she tried to pull them back up but was not able to. Patient History Information obtained from Patient. Social History Never smoker, Marital Status - Married, Alcohol Use - Never, Drug Use - No History, Caffeine Use - Never. Medical History Eyes Patient has history of Cataracts - removed Denies history of Glaucoma, Optic Neuritis Ear/Nose/Mouth/Throat Denies history of Chronic sinus problems/congestion, Middle ear problems Hematologic/Lymphatic Denies history of Anemia, Hemophilia, Human Immunodeficiency Virus, Lymphedema, Sickle Cell Disease Respiratory Denies history of Aspiration, Asthma, Chronic Obstructive Pulmonary Disease (COPD), Pneumothorax, Sleep Apnea, Tuberculosis Cardiovascular Patient has history of Angina, Coronary Artery Disease, Hypertension, Myocardial Infarction - more than 5 years ago, Peripheral Venous Disease Denies history of Arrhythmia, Congestive Heart Failure, Deep Vein Thrombosis, Hypotension, Peripheral Arterial Disease, Phlebitis, Vasculitis Gastrointestinal Denies history of Cirrhosis , Colitis, Crohn s, Hepatitis A, Hepatitis B, Hepatitis C Endocrine Patient has history of Type II Diabetes Denies history of Type I Diabetes Genitourinary Denies history of End Stage Renal Disease Immunological Denies history of Lupus Erythematosus, Raynaud s, Scleroderma Integumentary (Skin) Denies history of History of Burn, History of pressure wounds Musculoskeletal Patient has history of  Osteoarthritis Denies history of Gout, Rheumatoid Arthritis, Osteomyelitis Neurologic Patient has history of Neuropathy Denies history of Dementia, Quadriplegia, Paraplegia, Seizure Disorder Oncologic Patient has history of Received Chemotherapy, Received Radiation Psychiatric Denies history of Anorexia/bulimia, Confinement Anxiety Medical And Surgical History Notes Genitourinary CKD stage 3 Oncologic 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 Respiratory Denies complaints or symptoms of Chronic or frequent coughs, Shortness of Breath. Cardiovascular Perret, Akima P. ([id number]) Complains or has symptoms of LE edema. Denies complaints or symptoms of Chest pain. Psychiatric Denies complaints or symptoms of Anxiety, Claustrophobia. Objective Constitutional Well-nourished and well-hydrated in no acute distress. Vitals Time Taken: 8:20 AM, Height: 67 in, Weight: 216 lbs, BMI: 33.8, Temperature: 98.5 F, Pulse: 93 bpm, Respiratory Rate: 18 breaths/min, Blood Pressure: 160/76 mmHg. Respiratory normal breathing without difficulty. clear to auscultation bilaterally. Cardiovascular regular rate and rhythm with normal S1, S2. Psychiatric this patient is able to make decisions and demonstrates good insight into disease process. Alert and Oriented x 3. pleasant and cooperative. General Notes: Upon inspection today patient's lower extremities appear to have a couple areas that seem to blister although I think this is due to the fact that her wrap slid down somewhat. Nonetheless I think that the wrap still did do better for her than just the juxta light alone and again her surgery is 1 week from today. Nonetheless I think we need to try to keep things under control in the meantime. I did perform sharp debridement to the patient's  ulcer on her finger this is the right hand second digit near the tip. I was able to remove callus as well as some slough down to subcutaneous tissue. She tolerated this with no pain. Integumentary (Hair, Skin) Wound #8 status is Open. Original cause of wound was Gradually Appeared. The wound is located on the Right,Anterior Lower Leg. The wound measures 0.5cm length x 0.5cm width x 0.1cm depth; 0.196cm^2 area and 0.02cm^3 volume. There is Fat Layer (Subcutaneous Tissue) Exposed exposed. There is no tunneling or undermining noted. There is a medium amount of drainage noted. The wound  margin is flat and intact. There is medium (34-66%) red granulation within the wound bed. There is a medium (34-66%) amount of necrotic tissue within the wound bed including Adherent Slough. Wound #9R status is Open. Original cause of wound was Trauma. The wound is located on the Right Hand - 2nd Digit. The wound measures 0.4cm length x 0.3cm width x 0.1cm depth; 0.094cm^2 area and 0.009cm^3 volume. There is Fat Layer (Subcutaneous Tissue) Exposed exposed. There is no tunneling or undermining noted. There is a none present amount of drainage noted. The wound margin is thickened. There is no granulation within the wound bed. There is a medium (34-66%) amount of necrotic tissue within the wound bed. Assessment Active Problems Theresa [name], [name] 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 There was a  Excisional Skin/Subcutaneous Tissue Debridement with a total area of 0.12 sq cm performed by STONE III, HOYT E., PA-C. With the following instrument(s): Curette to remove Viable and Non-Viable tissue/material. Material removed includes Callus, Subcutaneous Tissue, and Slough after achieving pain control using Lidocaine 4% Topical Solution. No specimens were taken. A time out was conducted at 08:55, prior to the start of the procedure. A Minimum amount of bleeding was controlled with Pressure. The procedure was tolerated well with a pain level of 0 throughout and a pain level of 0 following the procedure. Post Debridement Measurements: 0.4cm length x 0.3cm width x 0.2cm depth; 0.019cm^3 volume. Character of Wound/Ulcer Post Debridement is improved. Post procedure Diagnosis Wound #9R: Same as Pre-Procedure Plan Wound Cleansing: Wound #8 Right,Anterior Lower Leg: Dial antibacterial soap, wash wounds, rinse and pat dry prior to dressing wounds Wound #9R Right Hand - 2nd Digit: Dial antibacterial soap, wash wounds, rinse and pat dry prior to dressing wounds Anesthetic (add to Medication List): Wound #8 Right,Anterior Lower Leg: Topical Lidocaine 4% cream applied to wound bed prior to debridement (In Clinic Only). Wound #9R Right Hand - 2nd Digit: Topical Lidocaine 4% cream applied to wound bed prior to debridement (In Clinic Only). Primary Wound Dressing: Wound #8 Right,Anterior Lower Leg: Silver Alginate - and blister on left leg Wound #9R Right Hand - 2nd Digit: Silver Collagen Secondary Dressing: Wound #8 Right,Anterior Lower Leg: ABD pad Dressing Change Frequency: Wound #8 Right,Anterior Lower Leg: Change dressing every week Wound #9R Right Hand - 2nd Digit: Change dressing every other day. Follow-up Appointments: LUMA, CLOPPER ([id number]) Return Appointment in 2 weeks. Nurse Visit as needed Edema Control: Wound #8 Right,Anterior Lower Leg: Kerlix and Coban - Bilateral The  following medication(s) was prescribed: Keflex oral 500 mg capsule 1 1 capsule oral taken 4 times a day for 7 days starting 09/11/2018 1. Would recommend that we go ahead and continue with the compression wraps for the time being since the patient seems to be doing well I think this is good news. I did suggest to the patient and her daughter that if by any chance the wrap start to slide down that they will need to have her come in for a quick nurse visit for [place]  to change these out we do not want him to slide down and stay down that way as that can cause additional issues and again we will try to get her to her surgery on Friday. 2. With regard to the dressing on the lower extremities I do believe that a silver alginate dressing is appropriate she seems to do well with this currently which is good news. 3. I am going to recommend with regard to her lymphedema pumps that she continue with the use of these I do believe that has helped to some degree as well. 4. Obviously we are trying to get her to elevate her legs as much as possible though with her hip pain she is not been able to do this effectively. After her surgery this coming Friday 1 week from today I am hoping that will be better. We will see patient back for reevaluation in 2 weeks here in the clinic. If anything worsens or changes patient will contact our office for additional recommendations. She has her surgery next week on Friday which is why we will not be able to see her then. Electronic Signature(s) Signed: 09/11/2018 2:15:22 PM By: Worthy Keeler PA-C Previous Signature: 09/11/2018 9:15:12 AM Version By: Worthy Keeler PA-C Entered By: Worthy Keeler on 09/11/2018 14:15:22 Worton, Jullie P. ([id number]) -------------------------------------------------------------------------------- ROS/PFSH Details Patient Name: Casady, Angeleena P. Date of Service: 09/11/2018 8:15 AM Medical Record Number: [id number] Patient Account Number:  [id number] Date of Birth/Sex: Oct 06, 1940 (78 y.o. F) Treating RN: Montey Hora Primary Care Provider: Johny Drilling Other Clinician: Referring Provider: Johny Drilling Treating Provider/Extender: Melburn Hake, HOYT Weeks in Treatment: 9 Information Obtained From Patient Constitutional Symptoms (General Health) Complaints and Symptoms: Negative for: Fatigue; Fever; Chills; Marked Weight Change Respiratory Complaints and Symptoms: Negative for: Chronic or frequent coughs; Shortness of Breath Medical History: Negative for: Aspiration; Asthma; Chronic Obstructive Pulmonary Disease (COPD); Pneumothorax; Sleep Apnea; Tuberculosis Cardiovascular Complaints and Symptoms: Positive for: LE edema Negative for: Chest pain Medical History: Positive for: Angina; Coronary Artery Disease; Hypertension; Myocardial Infarction - more than 5 years ago; Peripheral Venous Disease Negative for: Arrhythmia; Congestive Heart Failure; Deep Vein Thrombosis; Hypotension; Peripheral Arterial Disease; Phlebitis; Vasculitis Psychiatric Complaints and Symptoms: Negative for: Anxiety; Claustrophobia Medical History: Negative for: Anorexia/bulimia; Confinement Anxiety Eyes Medical History: Positive for: Cataracts - removed Negative for: Glaucoma; Optic Neuritis Ear/Nose/Mouth/Throat Medical History: Negative for: Chronic sinus problems/congestion; Middle ear problems Hematologic/Lymphatic ALEXYA, MCDARIS. ([id number]) Medical History: Negative for: Anemia; Hemophilia; Human Immunodeficiency Virus; Lymphedema; Sickle Cell Disease Gastrointestinal Medical History: Negative for: Cirrhosis ; Colitis; Crohnos; Hepatitis A; Hepatitis B; Hepatitis C Endocrine Medical History: Positive for: Type II Diabetes Negative for: Type I Diabetes Treated with: Insulin Blood sugar tested every day: Yes Tested : Blood sugar testing results: Breakfast: 211; Lunch: 240 Genitourinary Medical History: Negative for: End  Stage Renal Disease Past Medical History Notes: CKD stage 3 Immunological Medical History: Negative for: Lupus Erythematosus; Raynaudos; Scleroderma Integumentary (Skin) Medical History: Negative for: History of Burn; History of pressure wounds Musculoskeletal Medical History: Positive for: Osteoarthritis Negative for: Gout; Rheumatoid Arthritis; Osteomyelitis Neurologic Medical History: Positive for: Neuropathy Negative for: Dementia; Quadriplegia; Paraplegia; Seizure Disorder Oncologic Medical History: Positive for: Received Chemotherapy; Received Radiation Past Medical History Notes: 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 ([id number]) Immunizations Pneumococcal Vaccine: Received Pneumococcal Vaccination: Yes Implantable Devices No devices added Family and Social History Never smoker; Marital Status - Married; Alcohol Use:  Never; Drug Use: No History; Caffeine Use: Never; Financial Concerns: No; Food, Clothing or Shelter Needs: No; Support System Lacking: No; Transportation Concerns: No Physician Affirmation I have reviewed and agree with the above information. Electronic Signature(s) Signed: 09/11/2018 4:41:14 PM By: Montey Hora Signed: 09/12/2018 2:28:02 AM By: Worthy Keeler PA-C Entered By: Worthy Keeler on 09/11/2018 09:09:52 Gatlin, Ahliyah P. ([id number]) -------------------------------------------------------------------------------- SuperBill Details Patient Name: Offord, Polly P. Date of Service: 09/11/2018 Medical Record Number: [id number] Patient Account Number: [id number] Date of Birth/Sex: 1940/04/29 (78 y.o. F) Treating RN: Montey Hora Primary Care Provider: Johny Drilling Other Clinician: Referring Provider: Johny Drilling Treating Provider/Extender: Melburn Hake, HOYT Weeks in Treatment: 9 Diagnosis Coding ICD-10 Codes Code Description E11.622 Type 2 diabetes  mellitus with other skin ulcer I87.2 Venous insufficiency (chronic) (peripheral) L97.822 Non-pressure chronic ulcer of other part of left lower leg with fat layer exposed L97.522 Non-pressure chronic ulcer of other part of left foot with fat layer exposed S61.401A Unspecified open wound of right hand, initial encounter L98.492 Non-pressure chronic ulcer of skin 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 [place] (primary) hypertension Facility Procedures CPT4 Code: 75300511 Description: 02111 - DEB SUBQ TISSUE 20 SQ CM/< ICD-10 Diagnosis Description L98.492 Non-pressure chronic ulcer of skin of other sites with fat la Modifier: yer exposed Quantity: 1 Physician Procedures CPT4 Code Description: 7356701 41030 - WC PHYS LEVEL 4 - EST PT ICD-10 Diagnosis Description E11.622 Type 2 diabetes mellitus with other skin ulcer I87.2 Venous insufficiency (chronic) (peripheral) L97.822 Non-pressure chronic ulcer of other part of left  lower leg with L98.492 Non-pressure chronic ulcer of skin of other sites with fat laye Modifier: 25 fat layer expos r exposed Quantity: 1 ed CPT4 Code Description: [id number] - WC PHYS SUBQ TISS 20 SQ CM ICD-10 Diagnosis Description L98.492 Non-pressure chronic ulcer of skin of other sites with fat laye Modifier: r exposed Quantity: 1 Electronic Signature(s) Signed: 09/11/2018 9:19:49 AM By: Worthy Keeler PA-C Entered By: Worthy Keeler on 09/11/2018 09:19:49

## 2018-09-11 NOTE — Progress Notes (Addendum)
Theresa [name] ([id number]) Visit Report for 09/11/2018 Arrival Information Details Patient Name: Theresa [name], Theresa [name]. Date of Service: 09/11/2018 8:15 AM Medical Record Number: [id number] Patient Account Number: [id number] Date of Birth/Sex: 12-03-1940 (78 y.o. F) Treating RN: Montey Hora Primary Care Theresa [name]: Theresa [name] Other Clinician: Referring Theresa [name]: Theresa [name] Treating Theresa [name]/Extender: Theresa [name], Theresa [name] in Treatment: 9 Visit Information History Since Last Visit Added or deleted any medications: No Patient Arrived: Wheel Chair Any new allergies or adverse reactions: No Arrival Time: 08:16 Had a fall or experienced change in No Accompanied By: daughter activities of daily living that may affect Transfer Assistance: Manual risk of falls: Patient Identification Verified: Yes Signs or symptoms of abuse/neglect since last visito No Secondary Verification Process Yes Hospitalized since last visit: No Completed: Has Dressing in Place as Prescribed: Yes Patient Has Alerts: Yes Has Compression in Place as Prescribed: Yes Patient Alerts: DMII Pain Present Now: No ABI 03/06/18 L 1.28 R 1.22 Electronic Signature(s) Signed: 09/11/2018 2:35:02 PM By: Theresa [name] RCP, RRT, CHT Entered By: Theresa [name], Theresa [name] on 09/11/2018 08:21:08 [name], [name] ([id number]) -------------------------------------------------------------------------------- Encounter Discharge Information Details Patient Name: [name], [name] Date of Service: 09/11/2018 8:15 AM Medical Record Number: [id number] Patient Account Number: [id number] Date of Birth/Sex: 04-20-1940 (78 y.o. F) Treating RN: Montey Hora Primary Care Theresa [name]: Theresa [name] Other Clinician: Referring Shawny Borkowski: Theresa [name] Treating Theresa [name]/Extender: Theresa [name], Theresa [name] in Treatment: 9 Encounter Discharge Information Items Post Procedure Vitals Discharge Condition: Stable Temperature (F):  98.5 Ambulatory Status: Wheelchair Pulse (bpm): 93 Discharge Destination: Home Respiratory Rate (breaths/min): 16 Transportation: Private Auto Blood Pressure (mmHg): 160/76 Accompanied By: daughter Schedule Follow-up Appointment: Yes Clinical Summary of Care: Electronic Signature(s) Signed: 09/11/2018 4:41:14 PM By: Montey Hora Entered By: Montey Hora on 09/11/2018 09:03:07 [name], [name] ([id number]) -------------------------------------------------------------------------------- Lower Extremity Assessment Details Patient Name: [name], [name] Date of Service: 09/11/2018 8:15 AM Medical Record Number: [id number] Patient Account Number: [id number] Date of Birth/Sex: 07-25-1940 (78 y.o. F) Treating RN: Theresa [name] Primary Care Zaidee Rion: Theresa [name] Other Clinician: Referring Haya Hemler: Theresa [name] Treating Theresa [name]/Extender: Theresa [name], Theresa [name] in Treatment: 9 Edema Assessment Assessed: [Left: No] [Right: No] [Left: Edema] [Right: :] Calf Left: Right: Point of Measurement: 37 cm From Medial Instep 41.5 cm 41.5 cm Ankle Left: Right: Point of Measurement: 12 cm From Medial Instep 23.5 cm 24.2 cm Vascular Assessment Pulses: Dorsalis Pedis Palpable: [Left:Yes] [Right:Yes] Posterior Tibial Palpable: [Left:Yes] [Right:Yes] Electronic Signature(s) Signed: 09/11/2018 3:49:40 PM By: Theresa [name] Entered By: Theresa [name] on 09/11/2018 08:31:03 [name], [name] ([id number]) -------------------------------------------------------------------------------- Multi Wound Chart Details Patient Name: [name], [name] Date of Service: 09/11/2018 8:15 AM Medical Record Number: [id number] Patient Account Number: [id number] Date of Birth/Sex: 11-29-1940 (78 y.o. F) Treating RN: Montey Hora Primary Care Calil Amor: Theresa [name] Other Clinician: Referring Theresa [name]: Theresa [name] Treating Theresa [name]/Extender: Theresa [name], Theresa [name] in Treatment: 9 Vital  Signs Height(in): 87 Pulse(bpm): 93 Weight(lbs): 216 Blood Pressure(mmHg): 160/76 Body Mass Index(BMI): 34 Temperature(F): 98.5 Respiratory Rate 18 (breaths/min): Photos: [N/A:N/A] Wound Location: Right Lower Leg - Anterior Right Hand - 2nd Digit N/A Wounding Event: Gradually Appeared Trauma N/A Primary Etiology: Diabetic Wound/Ulcer of the Trauma, Other N/A Lower Extremity Comorbid History: Cataracts, Angina, Coronary Cataracts, Angina, Coronary N/A Artery Disease, Hypertension, Artery Disease, Hypertension, Myocardial Infarction, Myocardial Infarction, Peripheral Venous Disease, Peripheral Venous Disease, Type II Diabetes, Type II Diabetes, Osteoarthritis, Neuropathy, Osteoarthritis, Neuropathy, Received Chemotherapy, Received Chemotherapy, Received Radiation Received Radiation Date Acquired: 07/29/2018 08/07/2018 N/A [name] of Treatment: 4  4 N/A Wound Status: Open Open N/A Wound Recurrence: No Yes N/A Measurements L x W x D 0.5x0.5x0.1 0.4x0.3x0.1 N/A (cm) Area (cm) : 0.196 0.094 N/A Volume (cm) : 0.02 0.009 N/A % Reduction in Area: 89.60% 88.00% N/A % Reduction in Volume: 89.40% 94.30% N/A Classification: Grade 1 Full Thickness Without N/A Exposed Support Structures Exudate Amount: Medium None Present N/A Wound Margin: Flat and Intact Thickened N/A Granulation Amount: Medium (34-66%) None Present (0%) N/A Granulation Quality: Red N/A N/A Necrotic Amount: Medium (34-66%) Medium (34-66%) N/A [name], [name] ([id number]) Exposed Structures: Fat Layer (Subcutaneous Fat Layer (Subcutaneous N/A Tissue) Exposed: Yes Tissue) Exposed: Yes Fascia: No Fascia: No Tendon: No Tendon: No Muscle: No Muscle: No Joint: No Joint: No Bone: No Bone: No Epithelialization: None Medium (34-66%) N/A Treatment Notes Electronic Signature(s) Signed: 09/11/2018 4:41:14 PM By: Montey Hora Entered By: Montey Hora on 09/11/2018 08:51:21 Knee, Theresa [name]  ([id number]) -------------------------------------------------------------------------------- Multi-Disciplinary Care Plan Details Patient Name: [name], [name] Date of Service: 09/11/2018 8:15 AM Medical Record Number: [id number] Patient Account Number: [id number] Date of Birth/Sex: 22-Oct-1940 (78 y.o. F) Treating RN: Montey Hora Primary Care Luverne Zerkle: Theresa [name] Other Clinician: Referring Nikkol Pai: Theresa [name] Treating Ann Bohne/Extender: Theresa [name], Theresa [name] in Treatment: 9 Active Inactive Electronic Signature(s) Signed: 10/27/2018 12:57:54 PM By: Gretta Cool BSN, RN, CWS, Kim RN, BSN Signed: 12/16/2018 4:49:13 PM By: Montey Hora Previous Signature: 09/11/2018 4:41:14 PM Version By: Montey Hora Entered By: Gretta Cool BSN, RN, CWS, Kim on 10/27/2018 12:57:53 Carraway, [name] ([id number]) -------------------------------------------------------------------------------- Pain Assessment Details Patient Name: [name], [name] Date of Service: 09/11/2018 8:15 AM Medical Record Number: [id number] Patient Account Number: [id number] Date of Birth/Sex: [birth year] (78 y.o. F) Treating RN: Montey Hora Primary Care Tenecia Ignasiak: Theresa [name] Other Clinician: Referring Ashelynn Marks: Theresa [name] Treating Shayle Donahoo/Extender: Theresa [name], Theresa [name] in Treatment: 9 Active Problems Location of Pain Severity and Description of Pain Patient Has Paino No Site Locations Pain Management and Medication Current Pain Management: Electronic Signature(s) Signed: 09/11/2018 2:35:02 PM By: Paulla Fore, RRT, CHT Signed: 09/11/2018 4:41:14 PM By: Montey Hora Entered By: Theresa [name] on 09/11/2018 08:21:15 Desilets, Vetra P. ([id number]) -------------------------------------------------------------------------------- Patient/Caregiver Education Details Patient Name: Martinovich, Etter P. Date of Service: 09/11/2018 8:15 AM Medical Record Number: [id number] Patient Account  Number: [id number] Date of Birth/Gender: 25-Jan-1941 (78 y.o. F) Treating RN: Montey Hora Primary Care Physician: Theresa [name] Other Clinician: Referring Physician: Johny [name] Treating Physician/Extender: Sharalyn Ink in Treatment: 9 Education Assessment Education Provided To: Patient and Caregiver Education Topics Provided Venous: Handouts: Other: use velcro wraps post surgery Methods: Explain/Verbal Responses: State content correctly Wound/Skin Impairment: Handouts: Other: wound care - finger Methods: Demonstration, Explain/Verbal Responses: State content correctly Electronic Signature(s) Signed: 09/11/2018 4:41:14 PM By: Montey Hora Entered By: Montey Hora on 09/11/2018 09:00:39 Stormes, Blondell P. ([id number]) -------------------------------------------------------------------------------- Wound Assessment Details Patient Name: [name], [name] Date of Service: 09/11/2018 8:15 AM Medical Record Number: [id number] Patient Account Number: [id number] Date of Birth/Sex: May 07, 1940 (78 y.o. F) Treating RN: Theresa [name] Primary Care Deryl Ports: Theresa [name] Other Clinician: Referring Lora Glomski: Theresa [name] Treating Becci Batty/Extender: Theresa [name], Theresa [name] in Treatment: 9 Wound Status Wound Number: 8 Primary Diabetic Wound/Ulcer of the Lower Extremity Etiology: Wound Location: Right Lower Leg - Anterior Wound Open Wounding Event: Gradually Appeared Status: Date Acquired: 07/29/2018 Comorbid Cataracts, Angina, Coronary Artery Disease, [name] Of Treatment: 4 History: Hypertension, Myocardial Infarction, Peripheral Clustered Wound: No Venous Disease, Type II Diabetes, Osteoarthritis, Neuropathy, Received Chemotherapy, Received Radiation Photos Wound Measurements Length: (  cm) 0.5 % Reduction in Width: (cm) 0.5 % Reduction in Depth: (cm) 0.1 Epithelializat Area: (cm) 0.196 Tunneling: Volume: (cm) 0.02 Undermining: Area: 89.6% Volume: 89.4% ion:  None No No Wound Description Classification: Grade 1 Foul Odor Afte Wound Margin: Flat and Intact Slough/Fibrino Exudate Amount: Medium r Cleansing: No Yes Wound Bed Granulation Amount: Medium (34-66%) Exposed Structure Granulation Quality: Red Fascia Exposed: No Necrotic Amount: Medium (34-66%) Fat Layer (Subcutaneous Tissue) Exposed: Yes Necrotic Quality: Adherent Slough Tendon Exposed: No Muscle Exposed: No Joint Exposed: No Bone Exposed: No Electronic Signature(s) DUPLESSIS, Sakai P. ([id number]) Signed: 09/11/2018 3:49:40 PM By: Theresa [name] Entered By: Theresa [name] on 09/11/2018 08:33:28 Biegler, Makenzy P. ([id number]) -------------------------------------------------------------------------------- Wound Assessment Details Patient Name: [name], [name] Date of Service: 09/11/2018 8:15 AM Medical Record Number: [id number] Patient Account Number: [id number] Date of Birth/Sex: 1941-01-16 (78 y.o. F) Treating RN: Theresa [name] Primary Care Magnus Crescenzo: Theresa [name] Other Clinician: Referring Chavonne Sforza: Theresa [name] Treating Gazelle Towe/Extender: Theresa [name], Theresa [name] in Treatment: 9 Wound Status Wound Number: 9R Primary Trauma, Other Etiology: Wound Location: Right Hand - 2nd Digit Wound Open Wounding Event: Trauma Status: Date Acquired: 08/07/2018 Comorbid Cataracts, Angina, Coronary Artery Disease, [name] Of Treatment: 4 History: Hypertension, Myocardial Infarction, Peripheral Clustered Wound: No Venous Disease, Type II Diabetes, Osteoarthritis, Neuropathy, Received Chemotherapy, Received Radiation Photos Wound Measurements Length: (cm) 0.4 % Reducti Width: (cm) 0.3 % Reducti Depth: (cm) 0.1 Epithelia Area: (cm) 0.094 Tunnelin Volume: (cm) 0.009 Undermin on in Area: 88% on in Volume: 94.3% lization: Medium (34-66%) g: No ing: No Wound Description Full Thickness Without Exposed Support Foul Odor Classification: Structures Slough/Fi Wound Margin:  Thickened Exudate None Present Amount: After Cleansing: No brino Yes Wound Bed Granulation Amount: None Present (0%) Exposed Structure Necrotic Amount: Medium (34-66%) Fascia Exposed: No Fat Layer (Subcutaneous Tissue) Exposed: Yes Tendon Exposed: No Muscle Exposed: No Joint Exposed: No Bone Exposed: No Fruth, Sorcha P. ([id number]) Electronic Signature(s) Signed: 09/11/2018 3:49:40 PM By: Theresa [name] Entered By: Theresa [name] on 09/11/2018 08:42:36 Bostwick, Naela P. ([id number]) -------------------------------------------------------------------------------- Vitals Details Patient Name: [name], [name] Date of Service: 09/11/2018 8:15 AM Medical Record Number: [id number] Patient Account Number: [id number] Date of Birth/Sex: [birth year] (78 y.o. F) Treating RN: Montey Hora Primary Care Skyllar Notarianni: Theresa [name] Other Clinician: Referring Mervyn Pflaum: Theresa [name] Treating Witt Plitt/Extender: Theresa [name], Theresa [name] in Treatment: 9 Vital Signs Time Taken: 08:20 Temperature (F): 98.5 Height (in): 67 Pulse (bpm): 93 Weight (lbs): 216 Respiratory Rate (breaths/min): 18 Body Mass Index (BMI): 33.8 Blood Pressure (mmHg): 160/76 Reference Range: 80 - 120 mg / dl Electronic Signature(s) Signed: 09/11/2018 2:35:02 PM By: Theresa [name] RCP, RRT, CHT Entered By: Theresa [name] on 09/11/2018 08:23:16

## 2018-09-25 ENCOUNTER — Ambulatory Visit: Payer: Medicare HMO | Admitting: Physician Assistant

## 2020-01-27 IMAGING — CT CT OF THE RIGHT HIP WITHOUT CONTRAST
1 series · 16 of 32 positions shown, 20 images · non-contrast
Comparison: None.

CLINICAL DATA: Right hip pain.

EXAM:
CT OF THE RIGHT HIP WITHOUT CONTRAST
TECHNIQUE: Multidetector CT imaging of the right hip was performed according to
the standard protocol. Multiplanar CT image reconstructions were
also generated.

[Series 9: axial soft tissue · axial · 0.49mm/px · z∈[+955,+1171]mm · 16 of 120 slices shown, 20 images]
[im 8/120  soft-tissue]
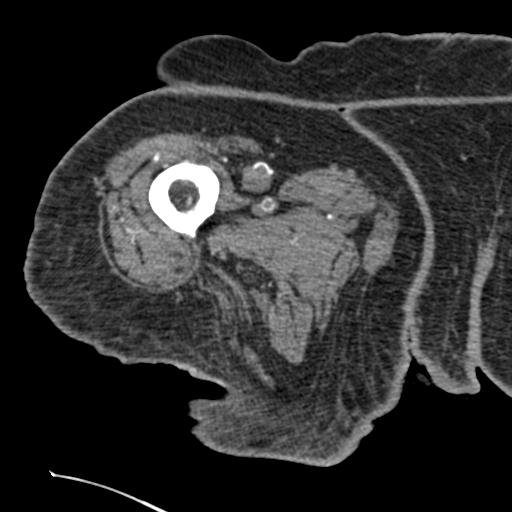
[im 8/120  bone]
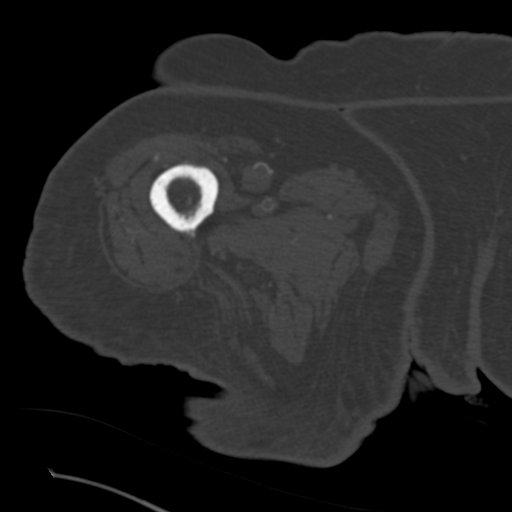
[im 16/120  soft-tissue]
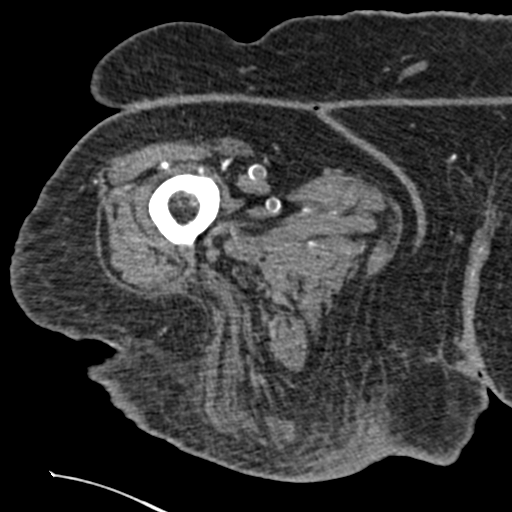
[im 24/120  soft-tissue]
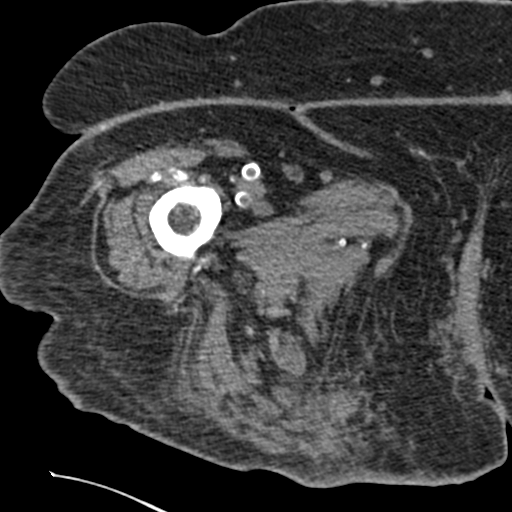
[im 31/120  soft-tissue]
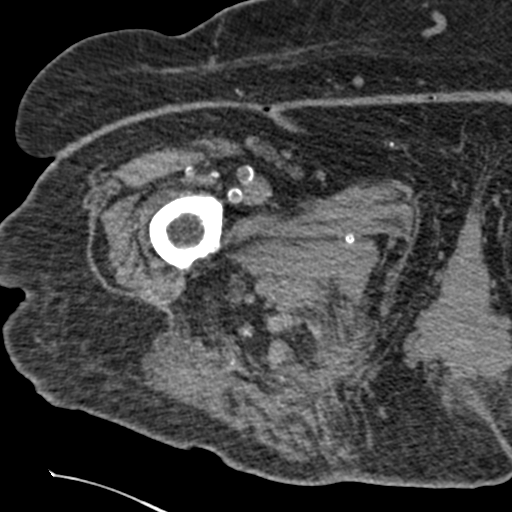
[im 39/120  soft-tissue]
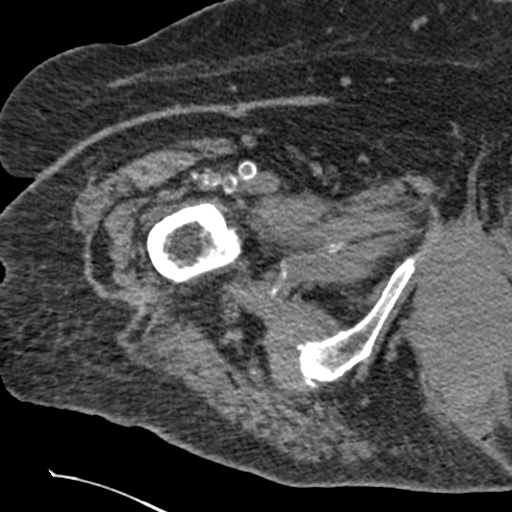
[im 47/120  soft-tissue]
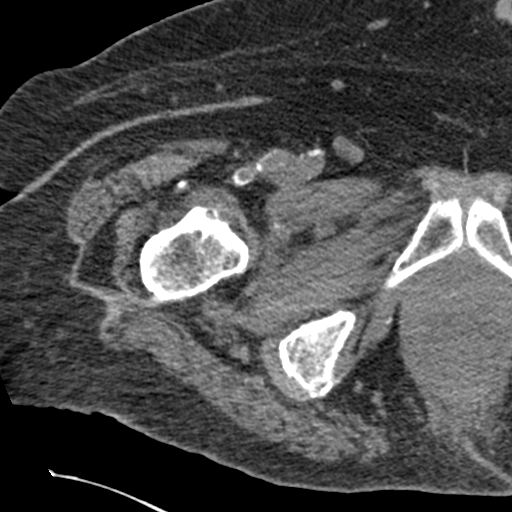
[im 54/120  soft-tissue]
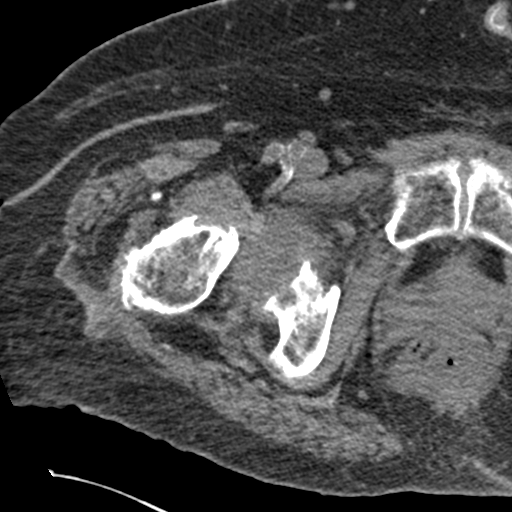
[im 66/120  soft-tissue]
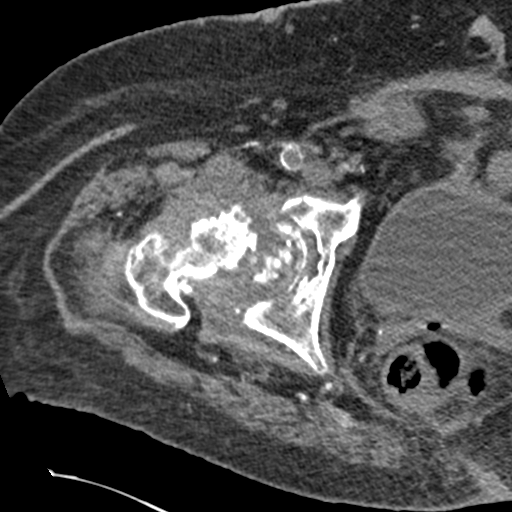
[im 73/120  soft-tissue]
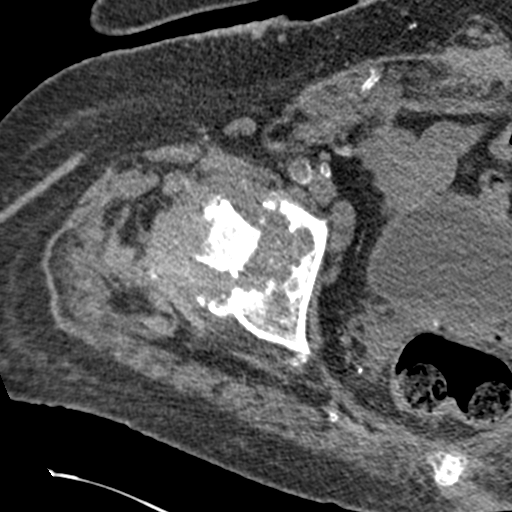
[im 73/120  bone]
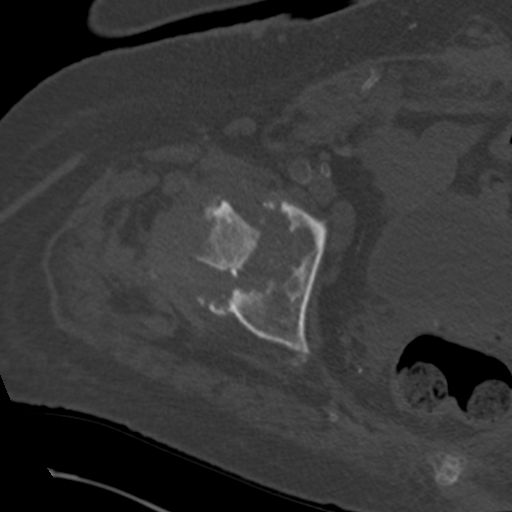
[im 81/120  soft-tissue]
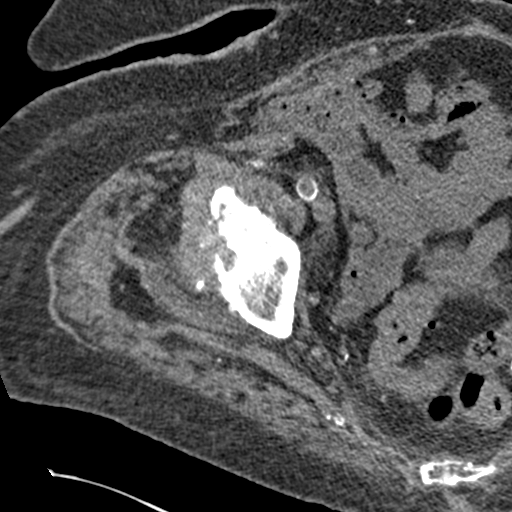
[im 89/120  soft-tissue]
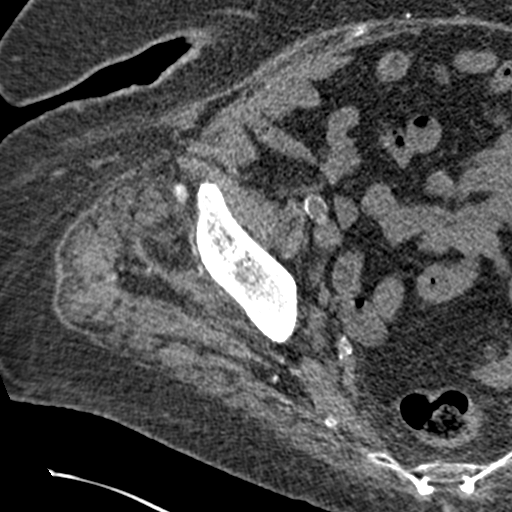
[im 96/120  soft-tissue]
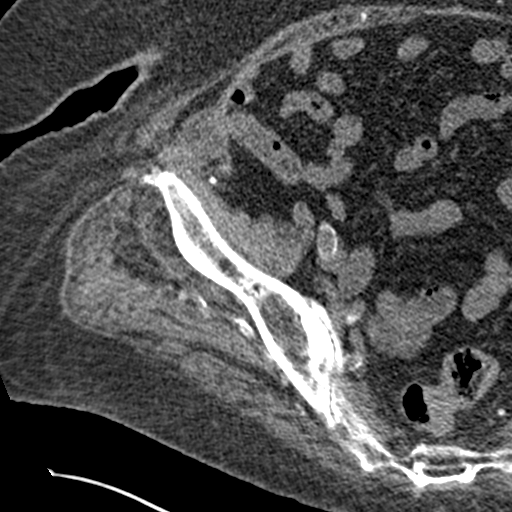
[im 104/120  soft-tissue]
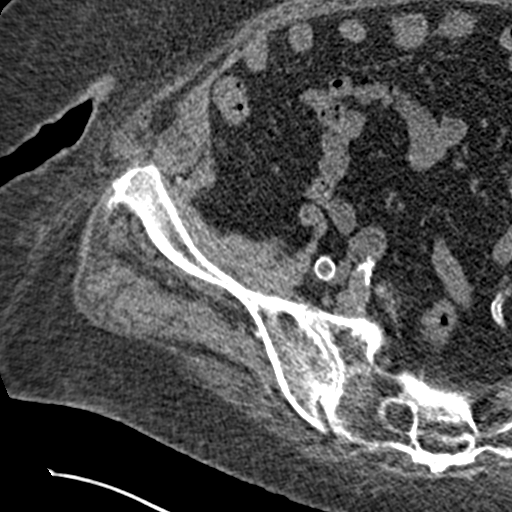
[im 104/120  lung]
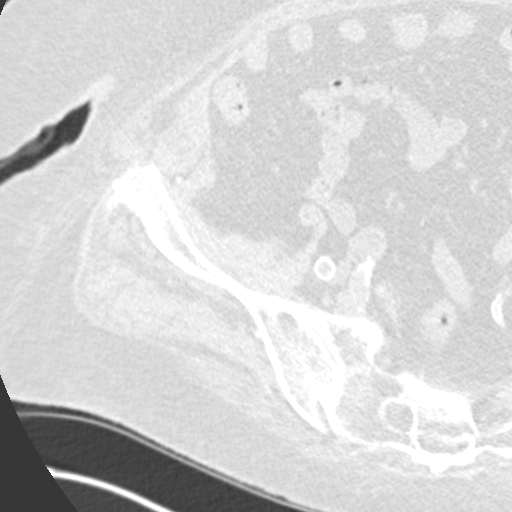
[im 108/120  lung]
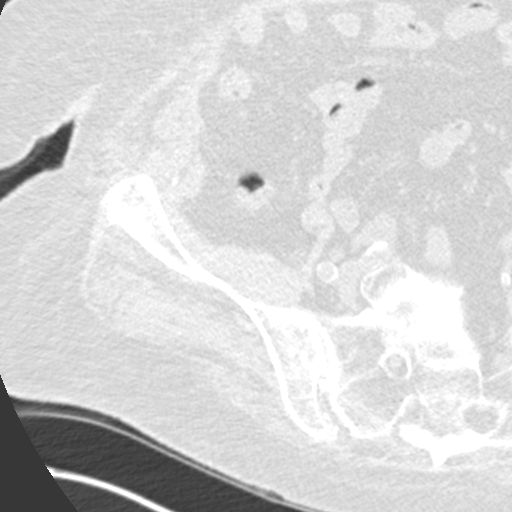
[im 112/120  soft-tissue]
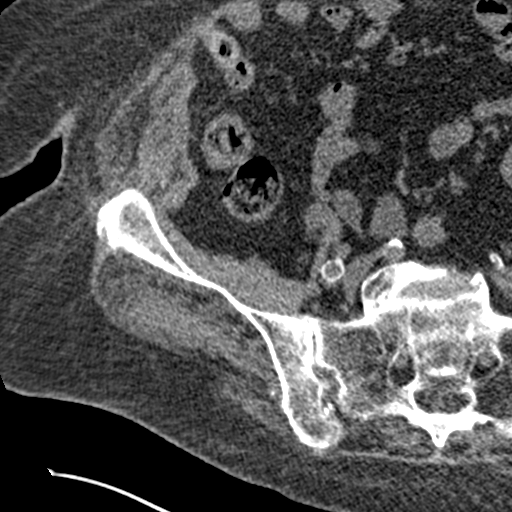
[im 112/120  lung]
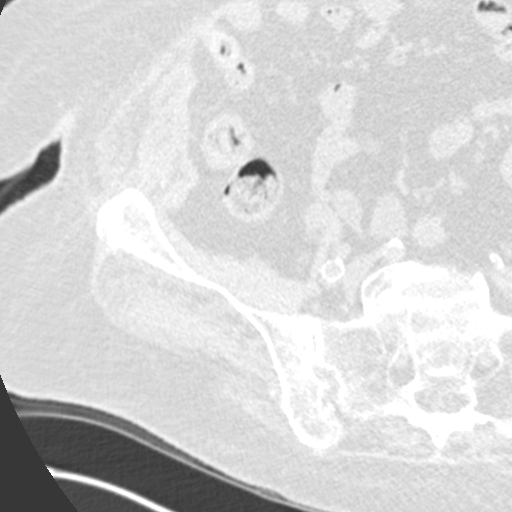
[im 116/120  lung]
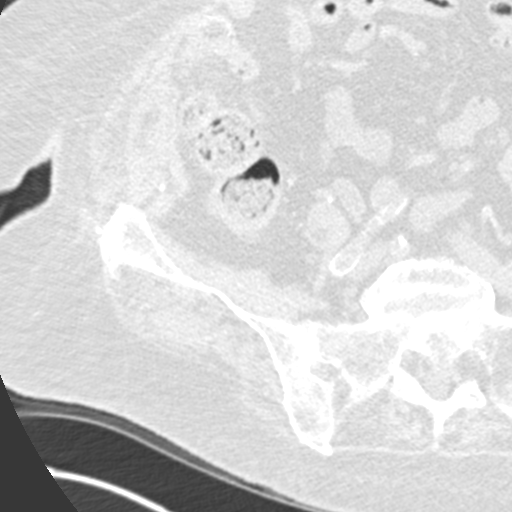

[16 of 32 positions shown; findings below may reference images not displayed]

FINDINGS: Bones/Joint/Cartilage

There is destruction of the right femoral head with numerous
erosions in the right acetabulum. There are also erosions in the
right femoral neck. There are multiple bone fragments in the hip
joint. There is a hip effusion. Several of the acetabular erosions
extend almost completely through the medial cortex.

No dislocation.

Muscles and Tendons

There is fatty atrophy of the gluteus minimus and medius muscles of
the right buttock. Extensive arterial vascular calcifications.

Soft tissues

Negative.
IMPRESSION: Findings consistent with severe inflammatory arthritis or aggressive
osteoarthritis of the right hip with destruction of the right
femoral head and partial destruction of the right acetabulum and
femoral neck.
